# Patient Record
Sex: Female | Born: 1945 | Race: White | Hispanic: No | Smoking: Former smoker
Health system: Southern US, Community
[De-identification: ages and names within clinical notes are randomized; demographics above are authoritative.]

## PROBLEM LIST (undated history)

## (undated) ENCOUNTER — Emergency Department (HOSPITAL_COMMUNITY): Payer: PRIVATE HEALTH INSURANCE | Source: Home / Self Care

## (undated) DIAGNOSIS — Z9981 Dependence on supplemental oxygen: Secondary | ICD-10-CM

## (undated) DIAGNOSIS — E7800 Pure hypercholesterolemia, unspecified: Secondary | ICD-10-CM

## (undated) DIAGNOSIS — F419 Anxiety disorder, unspecified: Secondary | ICD-10-CM

## (undated) DIAGNOSIS — F32A Depression, unspecified: Secondary | ICD-10-CM

## (undated) DIAGNOSIS — J449 Chronic obstructive pulmonary disease, unspecified: Secondary | ICD-10-CM

## (undated) DIAGNOSIS — J189 Pneumonia, unspecified organism: Secondary | ICD-10-CM

## (undated) DIAGNOSIS — F329 Major depressive disorder, single episode, unspecified: Secondary | ICD-10-CM

## (undated) DIAGNOSIS — C50919 Malignant neoplasm of unspecified site of unspecified female breast: Secondary | ICD-10-CM

## (undated) DIAGNOSIS — E785 Hyperlipidemia, unspecified: Secondary | ICD-10-CM

## (undated) DIAGNOSIS — I219 Acute myocardial infarction, unspecified: Secondary | ICD-10-CM

---

## 1979-07-30 HISTORY — PX: TUBAL LIGATION: SHX77

## 2012-02-21 DIAGNOSIS — J449 Chronic obstructive pulmonary disease, unspecified: Secondary | ICD-10-CM | POA: Diagnosis not present

## 2012-02-21 DIAGNOSIS — G4700 Insomnia, unspecified: Secondary | ICD-10-CM | POA: Diagnosis not present

## 2012-02-21 DIAGNOSIS — E782 Mixed hyperlipidemia: Secondary | ICD-10-CM | POA: Diagnosis not present

## 2012-04-25 DIAGNOSIS — D485 Neoplasm of uncertain behavior of skin: Secondary | ICD-10-CM | POA: Diagnosis not present

## 2012-04-25 DIAGNOSIS — C44319 Basal cell carcinoma of skin of other parts of face: Secondary | ICD-10-CM | POA: Diagnosis not present

## 2012-05-22 DIAGNOSIS — J449 Chronic obstructive pulmonary disease, unspecified: Secondary | ICD-10-CM | POA: Diagnosis not present

## 2012-06-29 DIAGNOSIS — C44319 Basal cell carcinoma of skin of other parts of face: Secondary | ICD-10-CM | POA: Diagnosis not present

## 2012-08-20 DIAGNOSIS — E782 Mixed hyperlipidemia: Secondary | ICD-10-CM | POA: Diagnosis not present

## 2012-08-20 DIAGNOSIS — J449 Chronic obstructive pulmonary disease, unspecified: Secondary | ICD-10-CM | POA: Diagnosis not present

## 2012-11-19 DIAGNOSIS — E782 Mixed hyperlipidemia: Secondary | ICD-10-CM | POA: Diagnosis not present

## 2012-11-19 DIAGNOSIS — J449 Chronic obstructive pulmonary disease, unspecified: Secondary | ICD-10-CM | POA: Diagnosis not present

## 2013-02-18 DIAGNOSIS — Z0000 Encounter for general adult medical examination without abnormal findings: Secondary | ICD-10-CM | POA: Diagnosis not present

## 2013-02-18 DIAGNOSIS — E782 Mixed hyperlipidemia: Secondary | ICD-10-CM | POA: Diagnosis not present

## 2013-04-28 DIAGNOSIS — J189 Pneumonia, unspecified organism: Secondary | ICD-10-CM

## 2013-04-28 HISTORY — DX: Pneumonia, unspecified organism: J18.9

## 2013-05-06 DIAGNOSIS — J449 Chronic obstructive pulmonary disease, unspecified: Secondary | ICD-10-CM | POA: Diagnosis not present

## 2013-05-10 DIAGNOSIS — E785 Hyperlipidemia, unspecified: Secondary | ICD-10-CM | POA: Diagnosis not present

## 2013-05-10 DIAGNOSIS — F411 Generalized anxiety disorder: Secondary | ICD-10-CM | POA: Diagnosis not present

## 2013-05-10 DIAGNOSIS — Z85028 Personal history of other malignant neoplasm of stomach: Secondary | ICD-10-CM | POA: Diagnosis not present

## 2013-05-10 DIAGNOSIS — B370 Candidal stomatitis: Secondary | ICD-10-CM | POA: Diagnosis not present

## 2013-05-10 DIAGNOSIS — J438 Other emphysema: Secondary | ICD-10-CM | POA: Diagnosis not present

## 2013-05-10 DIAGNOSIS — J441 Chronic obstructive pulmonary disease with (acute) exacerbation: Secondary | ICD-10-CM | POA: Diagnosis not present

## 2013-05-10 DIAGNOSIS — Z23 Encounter for immunization: Secondary | ICD-10-CM | POA: Diagnosis not present

## 2013-05-10 DIAGNOSIS — J9600 Acute respiratory failure, unspecified whether with hypoxia or hypercapnia: Secondary | ICD-10-CM | POA: Diagnosis not present

## 2013-05-10 DIAGNOSIS — IMO0002 Reserved for concepts with insufficient information to code with codable children: Secondary | ICD-10-CM | POA: Diagnosis not present

## 2013-05-10 DIAGNOSIS — R0989 Other specified symptoms and signs involving the circulatory and respiratory systems: Secondary | ICD-10-CM | POA: Diagnosis not present

## 2013-05-10 DIAGNOSIS — Z79899 Other long term (current) drug therapy: Secondary | ICD-10-CM | POA: Diagnosis not present

## 2013-05-10 DIAGNOSIS — Z780 Asymptomatic menopausal state: Secondary | ICD-10-CM | POA: Diagnosis not present

## 2013-05-10 DIAGNOSIS — J449 Chronic obstructive pulmonary disease, unspecified: Secondary | ICD-10-CM | POA: Diagnosis not present

## 2013-05-10 DIAGNOSIS — F17200 Nicotine dependence, unspecified, uncomplicated: Secondary | ICD-10-CM | POA: Diagnosis present

## 2013-05-11 DIAGNOSIS — B370 Candidal stomatitis: Secondary | ICD-10-CM | POA: Diagnosis not present

## 2013-05-11 DIAGNOSIS — E785 Hyperlipidemia, unspecified: Secondary | ICD-10-CM | POA: Diagnosis not present

## 2013-05-11 DIAGNOSIS — J9600 Acute respiratory failure, unspecified whether with hypoxia or hypercapnia: Secondary | ICD-10-CM | POA: Diagnosis not present

## 2013-05-11 DIAGNOSIS — J441 Chronic obstructive pulmonary disease with (acute) exacerbation: Secondary | ICD-10-CM | POA: Diagnosis not present

## 2013-05-11 DIAGNOSIS — F411 Generalized anxiety disorder: Secondary | ICD-10-CM | POA: Diagnosis not present

## 2013-05-15 DIAGNOSIS — R0989 Other specified symptoms and signs involving the circulatory and respiratory systems: Secondary | ICD-10-CM | POA: Diagnosis not present

## 2013-05-21 DIAGNOSIS — J449 Chronic obstructive pulmonary disease, unspecified: Secondary | ICD-10-CM | POA: Diagnosis not present

## 2013-05-21 DIAGNOSIS — F329 Major depressive disorder, single episode, unspecified: Secondary | ICD-10-CM | POA: Diagnosis not present

## 2013-08-20 DIAGNOSIS — J449 Chronic obstructive pulmonary disease, unspecified: Secondary | ICD-10-CM | POA: Diagnosis not present

## 2013-08-20 DIAGNOSIS — R609 Edema, unspecified: Secondary | ICD-10-CM | POA: Diagnosis not present

## 2013-09-26 DIAGNOSIS — Z23 Encounter for immunization: Secondary | ICD-10-CM | POA: Diagnosis not present

## 2013-09-27 DIAGNOSIS — E782 Mixed hyperlipidemia: Secondary | ICD-10-CM | POA: Diagnosis not present

## 2013-11-18 DIAGNOSIS — J449 Chronic obstructive pulmonary disease, unspecified: Secondary | ICD-10-CM | POA: Diagnosis not present

## 2013-12-30 DIAGNOSIS — J449 Chronic obstructive pulmonary disease, unspecified: Secondary | ICD-10-CM | POA: Diagnosis not present

## 2014-02-17 DIAGNOSIS — J449 Chronic obstructive pulmonary disease, unspecified: Secondary | ICD-10-CM | POA: Diagnosis not present

## 2014-02-17 DIAGNOSIS — E782 Mixed hyperlipidemia: Secondary | ICD-10-CM | POA: Diagnosis not present

## 2014-05-06 DIAGNOSIS — H2510 Age-related nuclear cataract, unspecified eye: Secondary | ICD-10-CM | POA: Diagnosis not present

## 2014-05-20 DIAGNOSIS — Z0000 Encounter for general adult medical examination without abnormal findings: Secondary | ICD-10-CM | POA: Diagnosis not present

## 2014-05-20 DIAGNOSIS — J449 Chronic obstructive pulmonary disease, unspecified: Secondary | ICD-10-CM | POA: Diagnosis not present

## 2014-08-21 DIAGNOSIS — J449 Chronic obstructive pulmonary disease, unspecified: Secondary | ICD-10-CM | POA: Diagnosis not present

## 2014-10-22 DIAGNOSIS — I219 Acute myocardial infarction, unspecified: Secondary | ICD-10-CM

## 2014-10-22 HISTORY — DX: Acute myocardial infarction, unspecified: I21.9

## 2014-10-23 ENCOUNTER — Inpatient Hospital Stay (HOSPITAL_COMMUNITY)
Admission: AD | Admit: 2014-10-23 | Discharge: 2014-10-24 | DRG: 282 | Disposition: A | Discharge status: Home / Self Care | Payer: Medicare Other | Source: Other Acute Inpatient Hospital | Attending: Cardiology | Admitting: Cardiology

## 2014-10-23 ENCOUNTER — Encounter (HOSPITAL_COMMUNITY): Payer: Self-pay | Admitting: Cardiology

## 2014-10-23 DIAGNOSIS — I498 Other specified cardiac arrhythmias: Secondary | ICD-10-CM | POA: Diagnosis not present

## 2014-10-23 DIAGNOSIS — E780 Pure hypercholesterolemia: Secondary | ICD-10-CM | POA: Diagnosis not present

## 2014-10-23 DIAGNOSIS — I2510 Atherosclerotic heart disease of native coronary artery without angina pectoris: Secondary | ICD-10-CM | POA: Diagnosis not present

## 2014-10-23 DIAGNOSIS — J449 Chronic obstructive pulmonary disease, unspecified: Secondary | ICD-10-CM | POA: Diagnosis present

## 2014-10-23 DIAGNOSIS — R0689 Other abnormalities of breathing: Secondary | ICD-10-CM | POA: Diagnosis not present

## 2014-10-23 DIAGNOSIS — Z9989 Dependence on other enabling machines and devices: Secondary | ICD-10-CM | POA: Diagnosis not present

## 2014-10-23 DIAGNOSIS — E785 Hyperlipidemia, unspecified: Secondary | ICD-10-CM | POA: Diagnosis present

## 2014-10-23 DIAGNOSIS — I517 Cardiomegaly: Secondary | ICD-10-CM | POA: Diagnosis not present

## 2014-10-23 DIAGNOSIS — Z7902 Long term (current) use of antithrombotics/antiplatelets: Secondary | ICD-10-CM

## 2014-10-23 DIAGNOSIS — Z79899 Other long term (current) drug therapy: Secondary | ICD-10-CM

## 2014-10-23 DIAGNOSIS — Z87891 Personal history of nicotine dependence: Secondary | ICD-10-CM | POA: Diagnosis not present

## 2014-10-23 DIAGNOSIS — J439 Emphysema, unspecified: Secondary | ICD-10-CM | POA: Diagnosis not present

## 2014-10-23 DIAGNOSIS — R079 Chest pain, unspecified: Secondary | ICD-10-CM | POA: Diagnosis not present

## 2014-10-23 DIAGNOSIS — I214 Non-ST elevation (NSTEMI) myocardial infarction: Principal | ICD-10-CM | POA: Diagnosis present

## 2014-10-23 DIAGNOSIS — R0602 Shortness of breath: Secondary | ICD-10-CM | POA: Diagnosis not present

## 2014-10-23 HISTORY — DX: Chronic obstructive pulmonary disease, unspecified: J44.9

## 2014-10-23 HISTORY — DX: Dependence on supplemental oxygen: Z99.81

## 2014-10-23 HISTORY — DX: Depression, unspecified: F32.A

## 2014-10-23 HISTORY — DX: Hyperlipidemia, unspecified: E78.5

## 2014-10-23 HISTORY — DX: Major depressive disorder, single episode, unspecified: F32.9

## 2014-10-23 HISTORY — DX: Pure hypercholesterolemia, unspecified: E78.00

## 2014-10-23 HISTORY — DX: Acute myocardial infarction, unspecified: I21.9

## 2014-10-23 HISTORY — DX: Pneumonia, unspecified organism: J18.9

## 2014-10-23 HISTORY — DX: Anxiety disorder, unspecified: F41.9

## 2014-10-23 LAB — LIPID PANEL
CHOL/HDL RATIO: 2.5 ratio
Cholesterol: 121 mg/dL (ref 0–200)
HDL: 49 mg/dL (ref >39)
LDL CALC: 57 mg/dL (ref 0–99)
Triglycerides: 73 mg/dL (ref <150)
VLDL: 15 mg/dL (ref 0–40)

## 2014-10-23 LAB — TROPONIN I: TROPONIN I: 2.75 ng/mL — AB (ref <0.30)

## 2014-10-23 MED ORDER — SODIUM CHLORIDE 0.9 % IV SOLN: 250.0000 mL | INTRAVENOUS | Status: DC | PRN

## 2014-10-23 MED ORDER — ONDANSETRON HCL 4 MG/2ML IJ SOLN: 4.0000 mg | Freq: Four times a day (QID) | INTRAMUSCULAR | Status: DC | PRN

## 2014-10-23 MED ORDER — BUDESONIDE-FORMOTEROL FUMARATE 160-4.5 MCG/ACT IN AERO
1.0000 | INHALATION_SPRAY | Freq: Every day | RESPIRATORY_TRACT | Status: DC
  Administered 2014-10-23 – 2014-10-24 (×2): 1 via RESPIRATORY_TRACT
  Filled 2014-10-23: qty 6

## 2014-10-23 MED ORDER — HEPARIN BOLUS VIA INFUSION
4000.0000 [IU] | Freq: Once | INTRAVENOUS | Status: AC
Start: 2014-10-23 — End: 2014-10-23
  Administered 2014-10-23: 4000 [IU] via INTRAVENOUS
  Filled 2014-10-23: qty 4000

## 2014-10-23 MED ORDER — HEPARIN (PORCINE) IN NACL 100-0.45 UNIT/ML-% IJ SOLN: 950.0000 [IU]/h | INTRAMUSCULAR | Status: DC

## 2014-10-23 MED ORDER — ATORVASTATIN CALCIUM 80 MG PO TABS: 80.0000 mg | ORAL_TABLET | Freq: Every day | ORAL | Status: DC

## 2014-10-23 MED ORDER — NITROGLYCERIN 0.4 MG SL SUBL: 0.4000 mg | SUBLINGUAL_TABLET | SUBLINGUAL | Status: DC | PRN

## 2014-10-23 MED ORDER — SODIUM CHLORIDE 0.9 % IV SOLN
INTRAVENOUS | Status: DC
  Administered 2014-10-24: 04:00:00 via INTRAVENOUS

## 2014-10-23 MED ORDER — ASPIRIN EC 81 MG PO TBEC
81.0000 mg | DELAYED_RELEASE_TABLET | Freq: Every day | ORAL | Status: DC
  Administered 2014-10-24: 81 mg via ORAL
  Filled 2014-10-23: qty 1

## 2014-10-23 MED ORDER — ALBUTEROL SULFATE (2.5 MG/3ML) 0.083% IN NEBU: 2.5000 mg | INHALATION_SOLUTION | Freq: Four times a day (QID) | RESPIRATORY_TRACT | Status: DC | PRN

## 2014-10-23 MED ORDER — ALPRAZOLAM 0.25 MG PO TABS
0.2500 mg | ORAL_TABLET | Freq: Two times a day (BID) | ORAL | Status: DC | PRN
  Administered 2014-10-23 – 2014-10-24 (×2): 0.25 mg via ORAL
  Filled 2014-10-23 (×2): qty 1

## 2014-10-23 MED ORDER — SODIUM CHLORIDE 0.9 % IJ SOLN
3.0000 mL | Freq: Two times a day (BID) | INTRAMUSCULAR | Status: DC
  Administered 2014-10-23: 3 mL via INTRAVENOUS

## 2014-10-23 MED ORDER — ASPIRIN 300 MG RE SUPP: 300.0000 mg | RECTAL | Status: DC

## 2014-10-23 MED ORDER — TIOTROPIUM BROMIDE MONOHYDRATE 18 MCG IN CAPS
18.0000 ug | ORAL_CAPSULE | Freq: Every day | RESPIRATORY_TRACT | Status: DC
  Administered 2014-10-24: 18 ug via RESPIRATORY_TRACT
  Filled 2014-10-23: qty 5

## 2014-10-23 MED ORDER — CITALOPRAM HYDROBROMIDE 20 MG PO TABS
20.0000 mg | ORAL_TABLET | Freq: Every day | ORAL | Status: DC
  Administered 2014-10-24: 20 mg via ORAL
  Filled 2014-10-23: qty 1

## 2014-10-23 MED ORDER — ZOLPIDEM TARTRATE 5 MG PO TABS: 5.0000 mg | ORAL_TABLET | Freq: Every evening | ORAL | Status: DC | PRN

## 2014-10-23 MED ORDER — METOPROLOL TARTRATE 25 MG PO TABS
25.0000 mg | ORAL_TABLET | Freq: Two times a day (BID) | ORAL | Status: DC
Start: 2014-10-23 — End: 2014-10-24
  Administered 2014-10-23: 25 mg via ORAL
  Filled 2014-10-23: qty 1

## 2014-10-23 MED ORDER — ASPIRIN 81 MG PO CHEW: 324.0000 mg | CHEWABLE_TABLET | ORAL | Status: DC

## 2014-10-23 MED ORDER — SODIUM CHLORIDE 0.9 % IJ SOLN: 3.0000 mL | INTRAMUSCULAR | Status: DC | PRN

## 2014-10-23 MED ORDER — ACETAMINOPHEN 325 MG PO TABS: 650.0000 mg | ORAL_TABLET | ORAL | Status: DC | PRN

## 2014-10-23 MED ORDER — HEPARIN (PORCINE) IN NACL 100-0.45 UNIT/ML-% IJ SOLN
950.0000 [IU]/h | INTRAMUSCULAR | Status: DC
  Administered 2014-10-23: 950 [IU]/h via INTRAVENOUS
  Filled 2014-10-23: qty 250

## 2014-10-23 NOTE — Progress Notes (Addendum)
ANTICOAGULATION CONSULT NOTE - Initial Consult  Pharmacy Consult for Heparin Indication: ACS / STEMI  No Known Allergies  Patient Measurements: Height: 5\' 6"  (167.6 cm) Weight: 173 lb 1.6 oz (78.518 kg) IBW/kg (Calculated) : 59.3 Heparin Dosing Weight: 78 kg  Vital Signs: Temp: 99 F (37.2 C) (11/26 1946) Temp Source: Oral (11/26 1946) BP: 131/61 mmHg (11/26 1946) Pulse Rate: 93 (11/26 1946)  Labs: No results for input(s): HGB, HCT, PLT, APTT, LABPROT, INR, HEPARINUNFRC, CREATININE, CKTOTAL, CKMB, TROPONINI in the last 72 hours.  CrCl cannot be calculated (Patient has no serum creatinine result on file.).   Medical History: No past medical history on file.  Assessment: 68 yo F presents on 11/26 with MI. Transferred from [place]. CBC earlier today showed Hgb of 12.6, plts 267. Pharmacy to dose heparin for ACS/MI.  Goal of Therapy:  Heparin level 0.3-0.7 units/ml Monitor platelets by anticoagulation protocol: Yes   Plan:  Give heparin BOLUS 4,000 units x 1 Start heparin gtt 950 units/hr Check 6hr HL at 0300 tomorrow Monitor daily HL, CBC, s/s of bleed  Leronda Lewers J 10/23/2014,8:16 PM

## 2014-10-23 NOTE — H&P (Signed)
Barbara Gibson is an 68 y.o. female.   Chief Complaint: Chest pain HPI: Patient is a 68 year old Caucasian female with history of COPD, prior tobacco use disorder quit 2 years ago, history of COPD, last night went to bed after having dinner, had chest tightness all night in the middle of the chest. She was uncomfortable throughout the night but no other associated symptoms. In the morning she felt that she needed to be checked out, she got dressed and felt the chest pain immediately got relieved but still went to [facility] where she was diagnosed with non-ST elevation myocardial infarction with positive serum troponin of 2.0. Next  Now transferred to [facility] for further cardiac evaluation. Patient asymptomatic. She has chronic dyspnea, daughter present at the bedside. Denies symptoms of TIA or claudication. She is not a diabetic.  Past Medical History  Diagnosis Date  . COPD (chronic obstructive pulmonary disease)   . Hyperlipidemia     No past surgical history on file.  No family history on file. Social History:  reports that she quit smoking about 22 months ago. She does not have any smokeless tobacco history on file. She reports that she does not drink alcohol or use illicit drugs.  Allergies: No Known Allergies  Medications Prior to Admission  Medication Sig Dispense Refill  . albuterol (PROVENTIL) (2.5 MG/3ML) 0.083% nebulizer solution Take 2.5 mg by nebulization every 6 (six) hours as needed for wheezing or shortness of breath.    . ALPRAZolam (XANAX) 1 MG tablet Take 1 mg by mouth 3 (three) times daily as needed for anxiety.    . Biotin (BIOTIN MAXIMUM STRENGTH) 10 MG TABS Take 1 tablet by mouth daily.    . budesonide-formoterol (SYMBICORT) 160-4.5 MCG/ACT inhaler Inhale 1 puff into the lungs daily.    . calcium gluconate 500 MG tablet Take 1 tablet by mouth daily.    . citalopram (CELEXA) 20 MG tablet Take 20 mg by mouth daily.    Marland Kitchen ibuprofen (ADVIL,MOTRIN) 200  MG tablet Take 600-800 mg by mouth every 6 (six) hours as needed for headache (pain).    . Multiple Vitamin (MULTIVITAMIN WITH MINERALS) TABS tablet Take 1 tablet by mouth daily.    . Omega-3 Fatty Acids (FISH OIL) 1000 MG CAPS Take 1 capsule by mouth 2 (two) times daily.    . simvastatin (ZOCOR) 20 MG tablet Take 20 mg by mouth every evening.    . tiotropium (SPIRIVA) 18 MCG inhalation capsule Place 18 mcg into inhaler and inhale daily.        Review of Systems - has mild chronic dyspnea, no PND or orthopnea. No bowel or bladder disturbances. No symptoms to suggest TIA. No recent weight changes. Not a diabetic. Other systems negative.  Blood pressure 131/61, pulse 93, temperature 99 F (37.2 C), temperature source Oral, height 5\' 6"  (1.676 m), weight 78.518 kg (173 lb 1.6 oz), SpO2 96 %. General appearance: alert, cooperative, appears stated age and no distress Eyes: negative findings: lids and lashes normal Neck: no carotid bruit, no JVD, supple, symmetrical, trachea midline and thyroid not enlarged, symmetric, no tenderness/mass/nodules Neck: JVP - normal, carotids 2+= without bruits Resp: Mild emphysematous chest, faint scattered crackles heard. Chest wall: no tenderness Cardio: regular rate and rhythm, S1, S2 normal, no murmur, click, rub or gallop GI: soft, non-tender; bowel sounds normal; no masses,  no organomegaly Extremities: extremities normal, atraumatic, no cyanosis or edema Pulses: 2+ and symmetric Skin: Skin color, texture, turgor normal. No rashes  or lesions Neurologic: Grossly normal  EKG:  NSR. No ischemia  Assessment/Plan 1. Non-ST elevation myocardial infarction 2. COPD 3. Hyperlipidemia 4. Prior history of tobacco use disorder  Recommendation: Patient needs coronary angiography. Medical therapy versus angiography was discussed with the patient and her daughter at the bedside. Patient presently asymptomatic. Due to her cardiovascular risk factors, best option is  to proceed with angiography, I have discussed regarding less than 1% risk of death, stroke, MI, need for urgent CABG but not limited to this. Patient is willing to proceed.  Laverda Page, MD 10/23/2014, 9:09 PM [facility] [place] Pager: [phone] Office: [phone] If no answer: Cell:  [phone]

## 2014-10-24 ENCOUNTER — Encounter (HOSPITAL_COMMUNITY): Payer: Self-pay | Admitting: General Practice

## 2014-10-24 ENCOUNTER — Encounter (HOSPITAL_COMMUNITY)
Admission: AD | Disposition: A | Discharge status: Home / Self Care | Payer: Self-pay | Source: Other Acute Inpatient Hospital | Attending: Cardiology

## 2014-10-24 DIAGNOSIS — I214 Non-ST elevation (NSTEMI) myocardial infarction: Secondary | ICD-10-CM | POA: Diagnosis not present

## 2014-10-24 DIAGNOSIS — J449 Chronic obstructive pulmonary disease, unspecified: Secondary | ICD-10-CM | POA: Diagnosis not present

## 2014-10-24 HISTORY — PX: CARDIAC CATHETERIZATION: SHX172

## 2014-10-24 HISTORY — PX: LEFT HEART CATHETERIZATION WITH CORONARY ANGIOGRAM: SHX5451

## 2014-10-24 LAB — BASIC METABOLIC PANEL
Anion gap: 9 (ref 5–15)
BUN: 7 mg/dL (ref 6–23)
CALCIUM: 8.6 mg/dL (ref 8.4–10.5)
CO2: 30 meq/L (ref 19–32)
CREATININE: 0.66 mg/dL (ref 0.50–1.10)
Chloride: 105 mEq/L (ref 96–112)
GFR calc Af Amer: >90 mL/min (ref >90)
GFR, EST NON AFRICAN AMERICAN: 88 mL/min — AB (ref >90)
GLUCOSE: 127 mg/dL — AB (ref 70–99)
Potassium: 4.1 mEq/L (ref 3.7–5.3)
Sodium: 144 mEq/L (ref 137–147)

## 2014-10-24 LAB — HEPARIN LEVEL (UNFRACTIONATED)
Heparin Unfractionated: 0.39 IU/mL (ref 0.30–0.70)
Heparin Unfractionated: <0.1 IU/mL — ABNORMAL LOW (ref 0.30–0.70)

## 2014-10-24 LAB — CBC
HCT: 36.7 % (ref 36.0–46.0)
Hemoglobin: 11.2 g/dL — ABNORMAL LOW (ref 12.0–15.0)
MCH: 27.5 pg (ref 26.0–34.0)
MCHC: 30.5 g/dL (ref 30.0–36.0)
MCV: 90.2 fL (ref 78.0–100.0)
PLATELETS: 213 10*3/uL (ref 150–400)
RBC: 4.07 MIL/uL (ref 3.87–5.11)
RDW: 13.3 % (ref 11.5–15.5)
WBC: 8.7 10*3/uL (ref 4.0–10.5)

## 2014-10-24 LAB — PROTIME-INR
INR: 1.17 (ref 0.00–1.49)
Prothrombin Time: 15 seconds (ref 11.6–15.2)

## 2014-10-24 LAB — TROPONIN I
Troponin I: 2.54 ng/mL (ref <0.30)
Troponin I: 1.77 ng/mL (ref <0.30)

## 2014-10-24 SURGERY — LEFT HEART CATHETERIZATION WITH CORONARY ANGIOGRAM
Anesthesia: LOCAL

## 2014-10-24 MED ORDER — MIDAZOLAM HCL 2 MG/2ML IJ SOLN
INTRAMUSCULAR | Status: AC
  Filled 2014-10-24: qty 2

## 2014-10-24 MED ORDER — SODIUM CHLORIDE 0.9 % IV SOLN
1.0000 mL/kg/h | INTRAVENOUS | Status: DC
  Administered 2014-10-24: 1 mL/kg/h via INTRAVENOUS

## 2014-10-24 MED ORDER — VERAPAMIL HCL 2.5 MG/ML IV SOLN
INTRAVENOUS | Status: AC
  Filled 2014-10-24: qty 2

## 2014-10-24 MED ORDER — HEPARIN (PORCINE) IN NACL 2-0.9 UNIT/ML-% IJ SOLN
INTRAMUSCULAR | Status: AC
  Filled 2014-10-24: qty 1000

## 2014-10-24 MED ORDER — INFLUENZA VAC SPLIT QUAD 0.5 ML IM SUSY
0.5000 mL | PREFILLED_SYRINGE | INTRAMUSCULAR | Status: AC
  Administered 2014-10-24: 0.5 mL via INTRAMUSCULAR
  Filled 2014-10-24: qty 0.5

## 2014-10-24 MED ORDER — LIDOCAINE HCL (PF) 1 % IJ SOLN
INTRAMUSCULAR | Status: AC
  Filled 2014-10-24: qty 30

## 2014-10-24 MED ORDER — METOPROLOL TARTRATE 25 MG PO TABS: 12.5000 mg | ORAL_TABLET | Freq: Two times a day (BID) | ORAL | Status: AC

## 2014-10-24 MED ORDER — SIMVASTATIN 40 MG PO TABS: 20.0000 mg | ORAL_TABLET | Freq: Every evening | ORAL | Status: DC

## 2014-10-24 MED ORDER — CLOPIDOGREL BISULFATE 75 MG PO TABS: 75.0000 mg | ORAL_TABLET | Freq: Every day | ORAL | Status: AC

## 2014-10-24 MED ORDER — HYDROMORPHONE HCL 1 MG/ML IJ SOLN
INTRAMUSCULAR | Status: AC
  Filled 2014-10-24: qty 1

## 2014-10-24 MED ORDER — NITROGLYCERIN 1 MG/10 ML FOR IR/CATH LAB
INTRA_ARTERIAL | Status: AC
  Filled 2014-10-24: qty 10

## 2014-10-24 MED ORDER — HEPARIN SODIUM (PORCINE) 1000 UNIT/ML IJ SOLN
INTRAMUSCULAR | Status: AC
  Filled 2014-10-24: qty 1

## 2014-10-24 NOTE — Plan of Care (Signed)
Problem: Phase I Progression Outcomes Goal: Pain controlled with appropriate interventions Outcome: Completed/Met Date Met:  10/24/14 Goal: Voiding-avoid urinary catheter unless indicated Outcome: Completed/Met Date Met:  10/24/14 Goal: Hemodynamically stable Outcome: Completed/Met Date Met:  10/24/14

## 2014-10-24 NOTE — Discharge Summary (Addendum)
Physician Discharge Summary  Patient ID: Barbara Gibson MRN: [id number] DOB/AGE: January 20, 1946 68 y.o.  Admit date: 10/23/2014 Discharge date: 10/24/2014  Primary Discharge Diagnosis NSTEMI CAD of the native vessels Secondary Discharge Diagnosis Hyperlipidemia, 10/23/2014: Total cholesterol 121, triglycerides 73, HDL 49, LDL 57.  Significant Diagnostic Studies: COPD Prior history of tobacco use disorder  Hospital Course: patient presented with chest pain that started on 10/22/2014 in the evening, had chest pain all night, in the morning she decided to get dressed up and go to the hospital. By the time she presented to [facility], patient was completely asymptomatic, however her cardiac markers were positive for NSTEMI. Hence was transferred to [facility] and was admitted for further evaluation.  She underwent coronary angiography the following morning, revealing mildly ulcerated lesion in the midsegment of the circumflex carotid artery, however this had healed up well and the stenosis was about 30-40% at most. There was also diagonal 1 stenosis, which was 30-40%, and the midsegment, felt stable for medical management. As she had had no recurrence of chest pain, hemodynamically stable, felt stable for discharge with outpatient follow-up.   Recommendations on discharge: patient started on Plavix 75 mg by mouth daily which I would recommend continuing for at least 9 months to year, metoprolol 25 mg one half tablet by mouth twice a day and increase simvastatin to 40 mg by mouth every afternoon. She'll be seen by [place] in the outpatient basis in 2 weeks.  Discharge Exam: Blood pressure 144/55, pulse 87, temperature 100.2 F (37.9 C), temperature source Oral, height 5\' 6"  (1.676 m), weight 78.291 kg (172 lb 9.6 oz), SpO2 90 %.   General appearance: alert, cooperative, appears stated age and no distress Eyes: negative findings: lids and lashes normal Neck: no carotid bruit, no JVD,  supple, symmetrical, trachea midline and thyroid not enlarged, symmetric, no tenderness/mass/nodules Neck: JVP - normal, carotids 2+= without bruits Resp: Mild emphysematous chest, faint scattered crackles heard. Chest wall: no tenderness Cardio: regular rate and rhythm, S1, S2 normal, no murmur, click, rub or gallop GI: soft, non-tender; bowel sounds normal; no masses, no organomegaly Extremities: extremities normal, atraumatic, no cyanosis or edema Pulses: 2+ and symmetric  Labs:  No results found for: WBC, HGB, HCT, MCV, PLT No results for input(s): NA, K, CL, CO2, BUN, CREATININE, CALCIUM, PROT, BILITOT, ALKPHOS, ALT, AST, GLUCOSE in the last 168 hours.  Invalid input(s): LABALBU Lab Results  Component Value Date   TROPONINI 2.54* 10/24/2014    Lipid Panel     Component Value Date/Time   CHOL 121 10/23/2014 2100   TRIG 73 10/23/2014 2100   HDL 49 10/23/2014 2100   CHOLHDL 2.5 10/23/2014 2100   VLDL 15 10/23/2014 2100   LDLCALC 57 10/23/2014 2100    EKG: 10/23/2014 and 10/24/2014: Normal sinus rhythm, no evidence of ischemia. Normal EKG.  FOLLOW UP PLANS AND APPOINTMENTS    Medication List    STOP taking these medications        ibuprofen 200 MG tablet  Commonly known as:  ADVIL,MOTRIN      TAKE these medications        albuterol (2.5 MG/3ML) 0.083% nebulizer solution  Commonly known as:  PROVENTIL  Take 2.5 mg by nebulization every 6 (six) hours as needed for wheezing or shortness of breath.     ALPRAZolam 1 MG tablet  Commonly known as:  XANAX  Take 1 mg by mouth 3 (three) times daily as needed for anxiety.  BIOTIN MAXIMUM STRENGTH 10 MG Tabs  Generic drug:  Biotin  Take 1 tablet by mouth daily.     budesonide-formoterol 160-4.5 MCG/ACT inhaler  Commonly known as:  SYMBICORT  Inhale 1 puff into the lungs daily.     calcium gluconate 500 MG tablet  Take 1 tablet by mouth daily.     citalopram 20 MG tablet  Commonly known as:  CELEXA  Take 20 mg  by mouth daily.     clopidogrel 75 MG tablet  Commonly known as:  PLAVIX  Take 1 tablet (75 mg total) by mouth daily.     Fish Oil 1000 MG Caps  Take 1 capsule by mouth 2 (two) times daily.     metoprolol tartrate 25 MG tablet  Commonly known as:  LOPRESSOR  Take 0.5 tablets (12.5 mg total) by mouth 2 (two) times daily.     multivitamin with minerals Tabs tablet  Take 1 tablet by mouth daily.     simvastatin 40 MG tablet  Commonly known as:  ZOCOR  Take 0.5 tablets (20 mg total) by mouth every evening.     tiotropium 18 MCG inhalation capsule  Commonly known as:  SPIRIVA  Place 18 mcg into inhaler and inhale daily.           Follow-up Information    Follow up with Laverda Page, MD.   Specialty:  Cardiology   Why:  Please call to be seen in 2 weeks   Contact information:   [address] [place] [phone]        Laverda Page, MD 10/24/2014, 8:36 AM  Pager: [phone] Office: [phone] If no answer: [phone]

## 2014-10-24 NOTE — Progress Notes (Signed)
Pt TR band off, patient ambulated. Pt is stable Cormac Wint V, RN

## 2014-10-24 NOTE — Progress Notes (Signed)
Pt discharge paperwork reviewed with pt, pt will follow up with Dr. Einar Gip in two weeks. Etta Quill, RN

## 2014-10-24 NOTE — Progress Notes (Signed)
ANTICOAGULATION CONSULT NOTE - Follow Up Consult  Pharmacy Consult for Heparin  Indication: chest pain/ACS  No Known Allergies  Patient Measurements: Height: 5\' 6"  (167.6 cm) Weight: 173 lb 1.6 oz (78.518 kg) IBW/kg (Calculated) : 59.3  Vital Signs: Temp: 99 F (37.2 C) (11/26 1946) Temp Source: Oral (11/26 1946) BP: 131/61 mmHg (11/26 1946) Pulse Rate: 93 (11/26 1946)  Labs:  Recent Labs  10/23/14 2100 10/24/14 0204 10/24/14 0304  HEPARINUNFRC  --   --  0.39  TROPONINI 2.75* 2.54*  --     Assessment: Therapeutic heparin level x 1, other labs as above.   Goal of Therapy:  Heparin level 0.3-0.7 units/ml Monitor platelets by anticoagulation protocol: Yes   Plan:  -Continue heparin at 950 units/hr -1200 HL, pending cath plans -Daily CBC/HL -Monitor for bleeding  Narda Bonds 10/24/2014,5:42 AM

## 2014-10-24 NOTE — Interval H&P Note (Signed)
History and Physical Interval Note:  10/24/2014 7:50 AM  Barbara Gibson  has presented today for surgery, with the diagnosis of cp  The various methods of treatment have been discussed with the patient and family. After consideration of risks, benefits and other options for treatment, the patient has consented to  Procedure(s): LEFT HEART CATHETERIZATION WITH CORONARY ANGIOGRAM (N/A) and possible PCI as a surgical intervention .  The patient's history has been reviewed, patient examined, no change in status, stable for surgery.  I have reviewed the patient's chart and labs.  Questions were answered to the patient's satisfaction.   Cath Lab Visit (complete for each Cath Lab visit)  Clinical Evaluation Leading to the Procedure:   ACS: Yes.    Non-ACS:    Anginal Classification: CCS IV. NSTEMI  Anti-ischemic medical therapy: No Therapy  Non-Invasive Test Results: No non-invasive testing performed  Prior CABG: No previous CABG        [facility]

## 2014-10-24 NOTE — CV Procedure (Signed)
Procedure performed:  Left heart catheterization including hemodynamic monitoring of the left ventricle, LV gram, selective right and left coronary arteriography.  Indication patient is a 68 year-old Caucasian  female with history of hyperlipidemia, who presents with NSTEMI.  Hence is brought to the cardiac catheterization lab to evaluate the  coronary anatomy for definitive diagnosis of CAD.  Hemodynamic data:  Left ventricular pressure was 104/12 with LVEDP of 18 mm mercury. Aortic pressure was 107/57 with a mean of 79 mm mercury. There was no pressure gradient across the aortic valve  Left ventricle: Performed in the RAO projection revealed LVEF of 60-65%. There was No significant MR. No wall motion abnormality.  Right coronary artery: The vessel is Dominant. There is mild diffuse luminal irregularity constituting 10-20% stenosis.  Left main coronary artery is large and normal.  Circumflex coronary artery: A large vessel giving origin to a small to moderate-sized OM1 and a large obtuse marginal 2. After the origin of OM1, the circumflex coronary artery has tandem 30-40% stenosis with minimal haziness, with brisk flow was evident.  LAD:  LAD gives origin to a large diagonal-1.  LAD has very minimal luminal irregularities. D1 is large, midsegment of the diagonal has a 30% stenosis.  Impression: Moderate coronary artery disease involving the circumflex coronary artery and the diagonal branch of the LAD. Suspect the circumflex stenosis to be culprit, however the unstable plaque appears to have healed well. Patient has remained asymptomatic without any recurrence of chest pain without any EKG abnormalities. Hence we can certainly treat this medically. Aggressive risk modification is indicated, lipid control and start the patient on aspirin along with lab export least one year. Unless recurrence of chest pain, medical therapy for now.  Technique: Under sterile precautions using a 6 French right  radial  arterial access, a 6 French sheath was introduced into the right radial artery. A 5 [place] Tig 4 catheter was advanced into the ascending aorta selective  right coronary artery and left coronary artery was cannulated and angiography was performed in multiple views. The catheter was pulled back Out of the body over exchange length J-wire.  Same Catheter was used to perform LV gram which was performed in RAO projection.  Catheter exchanged out of the body over J-Wire. NO immediate complications noted. Patient tolerated the procedure well. A total of 40 mL of contrast was utilized for diagnostic angiography.  Disposition: Will be discharged home today with outpatient follow up.

## 2014-10-27 NOTE — Progress Notes (Signed)
UR Completed Channie Bostick Graves-Bigelow, RN,BSN [phone]  

## 2014-11-06 ENCOUNTER — Encounter (HOSPITAL_COMMUNITY): Payer: Self-pay | Admitting: Cardiology

## 2014-11-12 DIAGNOSIS — I2510 Atherosclerotic heart disease of native coronary artery without angina pectoris: Secondary | ICD-10-CM | POA: Diagnosis not present

## 2014-11-12 DIAGNOSIS — E780 Pure hypercholesterolemia: Secondary | ICD-10-CM | POA: Diagnosis not present

## 2014-11-18 DIAGNOSIS — J441 Chronic obstructive pulmonary disease with (acute) exacerbation: Secondary | ICD-10-CM | POA: Diagnosis not present

## 2014-11-18 DIAGNOSIS — I209 Angina pectoris, unspecified: Secondary | ICD-10-CM | POA: Diagnosis not present

## 2015-02-16 DIAGNOSIS — I10 Essential (primary) hypertension: Secondary | ICD-10-CM | POA: Diagnosis not present

## 2015-02-16 DIAGNOSIS — E784 Other hyperlipidemia: Secondary | ICD-10-CM | POA: Diagnosis not present

## 2015-02-16 DIAGNOSIS — J441 Chronic obstructive pulmonary disease with (acute) exacerbation: Secondary | ICD-10-CM | POA: Diagnosis not present

## 2015-02-16 DIAGNOSIS — I2510 Atherosclerotic heart disease of native coronary artery without angina pectoris: Secondary | ICD-10-CM | POA: Diagnosis not present

## 2015-02-16 DIAGNOSIS — Z131 Encounter for screening for diabetes mellitus: Secondary | ICD-10-CM | POA: Diagnosis not present

## 2015-05-19 DIAGNOSIS — J441 Chronic obstructive pulmonary disease with (acute) exacerbation: Secondary | ICD-10-CM | POA: Diagnosis not present

## 2015-05-19 DIAGNOSIS — E784 Other hyperlipidemia: Secondary | ICD-10-CM | POA: Diagnosis not present

## 2015-05-19 DIAGNOSIS — M180 Bilateral primary osteoarthritis of first carpometacarpal joints: Secondary | ICD-10-CM | POA: Diagnosis not present

## 2015-05-19 DIAGNOSIS — I10 Essential (primary) hypertension: Secondary | ICD-10-CM | POA: Diagnosis not present

## 2015-08-20 DIAGNOSIS — J441 Chronic obstructive pulmonary disease with (acute) exacerbation: Secondary | ICD-10-CM | POA: Diagnosis not present

## 2015-08-20 DIAGNOSIS — I10 Essential (primary) hypertension: Secondary | ICD-10-CM | POA: Diagnosis not present

## 2015-08-20 DIAGNOSIS — E784 Other hyperlipidemia: Secondary | ICD-10-CM | POA: Diagnosis not present

## 2015-08-20 DIAGNOSIS — M180 Bilateral primary osteoarthritis of first carpometacarpal joints: Secondary | ICD-10-CM | POA: Diagnosis not present

## 2015-09-12 DIAGNOSIS — Z23 Encounter for immunization: Secondary | ICD-10-CM | POA: Diagnosis not present

## 2015-09-23 DIAGNOSIS — D485 Neoplasm of uncertain behavior of skin: Secondary | ICD-10-CM | POA: Diagnosis not present

## 2015-09-23 DIAGNOSIS — L821 Other seborrheic keratosis: Secondary | ICD-10-CM | POA: Diagnosis not present

## 2015-09-23 DIAGNOSIS — C44319 Basal cell carcinoma of skin of other parts of face: Secondary | ICD-10-CM | POA: Diagnosis not present

## 2015-09-23 DIAGNOSIS — Z23 Encounter for immunization: Secondary | ICD-10-CM | POA: Diagnosis not present

## 2015-09-23 DIAGNOSIS — Z85828 Personal history of other malignant neoplasm of skin: Secondary | ICD-10-CM | POA: Diagnosis not present

## 2015-09-23 DIAGNOSIS — C4441 Basal cell carcinoma of skin of scalp and neck: Secondary | ICD-10-CM | POA: Diagnosis not present

## 2015-11-19 DIAGNOSIS — Z1389 Encounter for screening for other disorder: Secondary | ICD-10-CM | POA: Diagnosis not present

## 2015-11-19 DIAGNOSIS — J440 Chronic obstructive pulmonary disease with acute lower respiratory infection: Secondary | ICD-10-CM | POA: Diagnosis not present

## 2015-11-19 DIAGNOSIS — Z0000 Encounter for general adult medical examination without abnormal findings: Secondary | ICD-10-CM | POA: Diagnosis not present

## 2015-12-03 DIAGNOSIS — C4441 Basal cell carcinoma of skin of scalp and neck: Secondary | ICD-10-CM | POA: Diagnosis not present

## 2015-12-17 DIAGNOSIS — C4401 Basal cell carcinoma of skin of lip: Secondary | ICD-10-CM | POA: Diagnosis not present

## 2016-01-11 DIAGNOSIS — E7800 Pure hypercholesterolemia, unspecified: Secondary | ICD-10-CM | POA: Diagnosis not present

## 2016-01-11 DIAGNOSIS — F329 Major depressive disorder, single episode, unspecified: Secondary | ICD-10-CM | POA: Diagnosis not present

## 2016-01-11 DIAGNOSIS — I252 Old myocardial infarction: Secondary | ICD-10-CM | POA: Diagnosis not present

## 2016-01-11 DIAGNOSIS — M810 Age-related osteoporosis without current pathological fracture: Secondary | ICD-10-CM | POA: Diagnosis not present

## 2016-01-11 DIAGNOSIS — Z87891 Personal history of nicotine dependence: Secondary | ICD-10-CM | POA: Diagnosis not present

## 2016-01-11 DIAGNOSIS — I10 Essential (primary) hypertension: Secondary | ICD-10-CM | POA: Diagnosis not present

## 2016-01-11 DIAGNOSIS — J449 Chronic obstructive pulmonary disease, unspecified: Secondary | ICD-10-CM | POA: Diagnosis not present

## 2016-01-11 DIAGNOSIS — Z780 Asymptomatic menopausal state: Secondary | ICD-10-CM | POA: Diagnosis not present

## 2016-01-11 DIAGNOSIS — M8589 Other specified disorders of bone density and structure, multiple sites: Secondary | ICD-10-CM | POA: Diagnosis not present

## 2016-01-11 DIAGNOSIS — Z79899 Other long term (current) drug therapy: Secondary | ICD-10-CM | POA: Diagnosis not present

## 2016-01-14 DIAGNOSIS — R921 Mammographic calcification found on diagnostic imaging of breast: Secondary | ICD-10-CM | POA: Diagnosis not present

## 2016-01-14 DIAGNOSIS — R928 Other abnormal and inconclusive findings on diagnostic imaging of breast: Secondary | ICD-10-CM | POA: Diagnosis not present

## 2016-01-14 DIAGNOSIS — Z1231 Encounter for screening mammogram for malignant neoplasm of breast: Secondary | ICD-10-CM | POA: Diagnosis not present

## 2016-02-03 DIAGNOSIS — R928 Other abnormal and inconclusive findings on diagnostic imaging of breast: Secondary | ICD-10-CM | POA: Diagnosis not present

## 2016-02-03 DIAGNOSIS — R921 Mammographic calcification found on diagnostic imaging of breast: Secondary | ICD-10-CM | POA: Diagnosis not present

## 2016-02-03 DIAGNOSIS — N6489 Other specified disorders of breast: Secondary | ICD-10-CM | POA: Diagnosis not present

## 2016-02-16 DIAGNOSIS — J440 Chronic obstructive pulmonary disease with acute lower respiratory infection: Secondary | ICD-10-CM | POA: Diagnosis not present

## 2016-02-17 ENCOUNTER — Other Ambulatory Visit: Payer: Self-pay | Admitting: Internal Medicine

## 2016-02-17 DIAGNOSIS — R921 Mammographic calcification found on diagnostic imaging of breast: Secondary | ICD-10-CM

## 2016-02-24 ENCOUNTER — Ambulatory Visit
Admission: RE | Admit: 2016-02-24 | Discharge: 2016-02-24 | Disposition: A | Discharge status: Home / Self Care | Payer: Medicare Other | Source: Ambulatory Visit | Attending: Internal Medicine | Admitting: Internal Medicine

## 2016-02-24 ENCOUNTER — Other Ambulatory Visit: Payer: Self-pay | Admitting: Internal Medicine

## 2016-02-24 DIAGNOSIS — R921 Mammographic calcification found on diagnostic imaging of breast: Secondary | ICD-10-CM | POA: Diagnosis not present

## 2016-02-24 DIAGNOSIS — D0512 Intraductal carcinoma in situ of left breast: Secondary | ICD-10-CM | POA: Diagnosis not present

## 2016-02-24 DIAGNOSIS — N63 Unspecified lump in breast: Secondary | ICD-10-CM | POA: Diagnosis not present

## 2016-03-02 DIAGNOSIS — D0512 Intraductal carcinoma in situ of left breast: Secondary | ICD-10-CM | POA: Diagnosis not present

## 2016-03-08 DIAGNOSIS — Z79899 Other long term (current) drug therapy: Secondary | ICD-10-CM | POA: Diagnosis not present

## 2016-03-08 DIAGNOSIS — I252 Old myocardial infarction: Secondary | ICD-10-CM | POA: Diagnosis not present

## 2016-03-08 DIAGNOSIS — D0512 Intraductal carcinoma in situ of left breast: Secondary | ICD-10-CM | POA: Diagnosis not present

## 2016-03-08 DIAGNOSIS — I10 Essential (primary) hypertension: Secondary | ICD-10-CM | POA: Diagnosis not present

## 2016-03-08 DIAGNOSIS — Z7902 Long term (current) use of antithrombotics/antiplatelets: Secondary | ICD-10-CM | POA: Diagnosis not present

## 2016-03-08 DIAGNOSIS — D242 Benign neoplasm of left breast: Secondary | ICD-10-CM | POA: Diagnosis not present

## 2016-03-08 DIAGNOSIS — Z01818 Encounter for other preprocedural examination: Secondary | ICD-10-CM | POA: Diagnosis not present

## 2016-03-08 DIAGNOSIS — N6092 Unspecified benign mammary dysplasia of left breast: Secondary | ICD-10-CM | POA: Diagnosis not present

## 2016-03-08 DIAGNOSIS — J449 Chronic obstructive pulmonary disease, unspecified: Secondary | ICD-10-CM | POA: Diagnosis not present

## 2016-03-08 DIAGNOSIS — Z85818 Personal history of malignant neoplasm of other sites of lip, oral cavity, and pharynx: Secondary | ICD-10-CM | POA: Diagnosis not present

## 2016-03-09 DIAGNOSIS — D0512 Intraductal carcinoma in situ of left breast: Secondary | ICD-10-CM | POA: Diagnosis not present

## 2016-03-09 DIAGNOSIS — I10 Essential (primary) hypertension: Secondary | ICD-10-CM | POA: Diagnosis not present

## 2016-03-09 DIAGNOSIS — E785 Hyperlipidemia, unspecified: Secondary | ICD-10-CM | POA: Diagnosis not present

## 2016-03-09 DIAGNOSIS — Z7902 Long term (current) use of antithrombotics/antiplatelets: Secondary | ICD-10-CM | POA: Diagnosis not present

## 2016-03-09 DIAGNOSIS — R920 Mammographic microcalcification found on diagnostic imaging of breast: Secondary | ICD-10-CM | POA: Diagnosis not present

## 2016-03-09 DIAGNOSIS — N6082 Other benign mammary dysplasias of left breast: Secondary | ICD-10-CM | POA: Diagnosis not present

## 2016-03-09 DIAGNOSIS — I252 Old myocardial infarction: Secondary | ICD-10-CM | POA: Diagnosis not present

## 2016-03-09 DIAGNOSIS — J449 Chronic obstructive pulmonary disease, unspecified: Secondary | ICD-10-CM | POA: Diagnosis not present

## 2016-03-09 DIAGNOSIS — Z79899 Other long term (current) drug therapy: Secondary | ICD-10-CM | POA: Diagnosis not present

## 2016-03-09 DIAGNOSIS — I213 ST elevation (STEMI) myocardial infarction of unspecified site: Secondary | ICD-10-CM | POA: Diagnosis not present

## 2016-03-09 DIAGNOSIS — D242 Benign neoplasm of left breast: Secondary | ICD-10-CM | POA: Diagnosis not present

## 2016-03-09 DIAGNOSIS — N6092 Unspecified benign mammary dysplasia of left breast: Secondary | ICD-10-CM | POA: Diagnosis not present

## 2016-03-09 DIAGNOSIS — Z85818 Personal history of malignant neoplasm of other sites of lip, oral cavity, and pharynx: Secondary | ICD-10-CM | POA: Diagnosis not present

## 2016-03-31 DIAGNOSIS — I252 Old myocardial infarction: Secondary | ICD-10-CM | POA: Diagnosis not present

## 2016-03-31 DIAGNOSIS — I2510 Atherosclerotic heart disease of native coronary artery without angina pectoris: Secondary | ICD-10-CM | POA: Diagnosis not present

## 2016-03-31 DIAGNOSIS — M8580 Other specified disorders of bone density and structure, unspecified site: Secondary | ICD-10-CM | POA: Insufficient documentation

## 2016-03-31 DIAGNOSIS — Z87891 Personal history of nicotine dependence: Secondary | ICD-10-CM | POA: Diagnosis not present

## 2016-03-31 DIAGNOSIS — J431 Panlobular emphysema: Secondary | ICD-10-CM | POA: Diagnosis not present

## 2016-03-31 DIAGNOSIS — D0512 Intraductal carcinoma in situ of left breast: Secondary | ICD-10-CM | POA: Diagnosis not present

## 2016-05-09 DIAGNOSIS — N611 Abscess of the breast and nipple: Secondary | ICD-10-CM | POA: Diagnosis not present

## 2016-05-10 DIAGNOSIS — I10 Essential (primary) hypertension: Secondary | ICD-10-CM | POA: Diagnosis not present

## 2016-05-10 DIAGNOSIS — F419 Anxiety disorder, unspecified: Secondary | ICD-10-CM | POA: Diagnosis not present

## 2016-05-10 DIAGNOSIS — N611 Abscess of the breast and nipple: Secondary | ICD-10-CM | POA: Diagnosis not present

## 2016-05-10 DIAGNOSIS — Z7902 Long term (current) use of antithrombotics/antiplatelets: Secondary | ICD-10-CM | POA: Diagnosis not present

## 2016-05-10 DIAGNOSIS — J449 Chronic obstructive pulmonary disease, unspecified: Secondary | ICD-10-CM | POA: Diagnosis not present

## 2016-05-10 DIAGNOSIS — Z79899 Other long term (current) drug therapy: Secondary | ICD-10-CM | POA: Diagnosis not present

## 2016-05-10 DIAGNOSIS — Z853 Personal history of malignant neoplasm of breast: Secondary | ICD-10-CM | POA: Diagnosis not present

## 2016-05-10 DIAGNOSIS — E785 Hyperlipidemia, unspecified: Secondary | ICD-10-CM | POA: Diagnosis not present

## 2016-05-10 DIAGNOSIS — I252 Old myocardial infarction: Secondary | ICD-10-CM | POA: Diagnosis not present

## 2016-05-11 DIAGNOSIS — Z4800 Encounter for change or removal of nonsurgical wound dressing: Secondary | ICD-10-CM | POA: Diagnosis not present

## 2016-05-11 DIAGNOSIS — I10 Essential (primary) hypertension: Secondary | ICD-10-CM | POA: Diagnosis not present

## 2016-05-11 DIAGNOSIS — N611 Abscess of the breast and nipple: Secondary | ICD-10-CM | POA: Diagnosis not present

## 2016-05-11 DIAGNOSIS — Z85819 Personal history of malignant neoplasm of unspecified site of lip, oral cavity, and pharynx: Secondary | ICD-10-CM | POA: Diagnosis not present

## 2016-05-11 DIAGNOSIS — I252 Old myocardial infarction: Secondary | ICD-10-CM | POA: Diagnosis not present

## 2016-05-12 DIAGNOSIS — N611 Abscess of the breast and nipple: Secondary | ICD-10-CM | POA: Diagnosis not present

## 2016-05-12 DIAGNOSIS — I10 Essential (primary) hypertension: Secondary | ICD-10-CM | POA: Diagnosis not present

## 2016-05-12 DIAGNOSIS — Z4800 Encounter for change or removal of nonsurgical wound dressing: Secondary | ICD-10-CM | POA: Diagnosis not present

## 2016-05-12 DIAGNOSIS — I252 Old myocardial infarction: Secondary | ICD-10-CM | POA: Diagnosis not present

## 2016-05-12 DIAGNOSIS — Z85819 Personal history of malignant neoplasm of unspecified site of lip, oral cavity, and pharynx: Secondary | ICD-10-CM | POA: Diagnosis not present

## 2016-05-13 DIAGNOSIS — I10 Essential (primary) hypertension: Secondary | ICD-10-CM | POA: Diagnosis not present

## 2016-05-13 DIAGNOSIS — Z85819 Personal history of malignant neoplasm of unspecified site of lip, oral cavity, and pharynx: Secondary | ICD-10-CM | POA: Diagnosis not present

## 2016-05-13 DIAGNOSIS — Z4800 Encounter for change or removal of nonsurgical wound dressing: Secondary | ICD-10-CM | POA: Diagnosis not present

## 2016-05-13 DIAGNOSIS — I252 Old myocardial infarction: Secondary | ICD-10-CM | POA: Diagnosis not present

## 2016-05-13 DIAGNOSIS — N611 Abscess of the breast and nipple: Secondary | ICD-10-CM | POA: Diagnosis not present

## 2016-05-16 DIAGNOSIS — I252 Old myocardial infarction: Secondary | ICD-10-CM | POA: Diagnosis not present

## 2016-05-16 DIAGNOSIS — N611 Abscess of the breast and nipple: Secondary | ICD-10-CM | POA: Diagnosis not present

## 2016-05-16 DIAGNOSIS — Z85819 Personal history of malignant neoplasm of unspecified site of lip, oral cavity, and pharynx: Secondary | ICD-10-CM | POA: Diagnosis not present

## 2016-05-16 DIAGNOSIS — Z4800 Encounter for change or removal of nonsurgical wound dressing: Secondary | ICD-10-CM | POA: Diagnosis not present

## 2016-05-16 DIAGNOSIS — I10 Essential (primary) hypertension: Secondary | ICD-10-CM | POA: Diagnosis not present

## 2016-05-17 DIAGNOSIS — J440 Chronic obstructive pulmonary disease with acute lower respiratory infection: Secondary | ICD-10-CM | POA: Diagnosis not present

## 2016-05-17 DIAGNOSIS — I10 Essential (primary) hypertension: Secondary | ICD-10-CM | POA: Diagnosis not present

## 2016-05-17 DIAGNOSIS — I252 Old myocardial infarction: Secondary | ICD-10-CM | POA: Diagnosis not present

## 2016-05-20 DIAGNOSIS — I252 Old myocardial infarction: Secondary | ICD-10-CM | POA: Diagnosis not present

## 2016-05-20 DIAGNOSIS — I10 Essential (primary) hypertension: Secondary | ICD-10-CM | POA: Diagnosis not present

## 2016-05-20 DIAGNOSIS — Z85819 Personal history of malignant neoplasm of unspecified site of lip, oral cavity, and pharynx: Secondary | ICD-10-CM | POA: Diagnosis not present

## 2016-05-20 DIAGNOSIS — Z4800 Encounter for change or removal of nonsurgical wound dressing: Secondary | ICD-10-CM | POA: Diagnosis not present

## 2016-05-20 DIAGNOSIS — N611 Abscess of the breast and nipple: Secondary | ICD-10-CM | POA: Diagnosis not present

## 2016-05-23 DIAGNOSIS — I252 Old myocardial infarction: Secondary | ICD-10-CM | POA: Diagnosis not present

## 2016-05-23 DIAGNOSIS — Z4800 Encounter for change or removal of nonsurgical wound dressing: Secondary | ICD-10-CM | POA: Diagnosis not present

## 2016-05-23 DIAGNOSIS — N611 Abscess of the breast and nipple: Secondary | ICD-10-CM | POA: Diagnosis not present

## 2016-05-23 DIAGNOSIS — Z85819 Personal history of malignant neoplasm of unspecified site of lip, oral cavity, and pharynx: Secondary | ICD-10-CM | POA: Diagnosis not present

## 2016-05-23 DIAGNOSIS — I10 Essential (primary) hypertension: Secondary | ICD-10-CM | POA: Diagnosis not present

## 2016-05-27 DIAGNOSIS — Z85819 Personal history of malignant neoplasm of unspecified site of lip, oral cavity, and pharynx: Secondary | ICD-10-CM | POA: Diagnosis not present

## 2016-05-27 DIAGNOSIS — Z4800 Encounter for change or removal of nonsurgical wound dressing: Secondary | ICD-10-CM | POA: Diagnosis not present

## 2016-05-27 DIAGNOSIS — I252 Old myocardial infarction: Secondary | ICD-10-CM | POA: Diagnosis not present

## 2016-05-27 DIAGNOSIS — N611 Abscess of the breast and nipple: Secondary | ICD-10-CM | POA: Diagnosis not present

## 2016-05-27 DIAGNOSIS — I10 Essential (primary) hypertension: Secondary | ICD-10-CM | POA: Diagnosis not present

## 2016-06-01 DIAGNOSIS — I10 Essential (primary) hypertension: Secondary | ICD-10-CM | POA: Diagnosis not present

## 2016-06-01 DIAGNOSIS — Z85819 Personal history of malignant neoplasm of unspecified site of lip, oral cavity, and pharynx: Secondary | ICD-10-CM | POA: Diagnosis not present

## 2016-06-01 DIAGNOSIS — Z4800 Encounter for change or removal of nonsurgical wound dressing: Secondary | ICD-10-CM | POA: Diagnosis not present

## 2016-06-01 DIAGNOSIS — N611 Abscess of the breast and nipple: Secondary | ICD-10-CM | POA: Diagnosis not present

## 2016-06-01 DIAGNOSIS — I252 Old myocardial infarction: Secondary | ICD-10-CM | POA: Diagnosis not present

## 2016-06-03 DIAGNOSIS — Z4800 Encounter for change or removal of nonsurgical wound dressing: Secondary | ICD-10-CM | POA: Diagnosis not present

## 2016-06-03 DIAGNOSIS — I252 Old myocardial infarction: Secondary | ICD-10-CM | POA: Diagnosis not present

## 2016-06-03 DIAGNOSIS — N611 Abscess of the breast and nipple: Secondary | ICD-10-CM | POA: Diagnosis not present

## 2016-06-03 DIAGNOSIS — I10 Essential (primary) hypertension: Secondary | ICD-10-CM | POA: Diagnosis not present

## 2016-06-03 DIAGNOSIS — Z85819 Personal history of malignant neoplasm of unspecified site of lip, oral cavity, and pharynx: Secondary | ICD-10-CM | POA: Diagnosis not present

## 2016-06-07 DIAGNOSIS — I10 Essential (primary) hypertension: Secondary | ICD-10-CM | POA: Diagnosis not present

## 2016-06-07 DIAGNOSIS — Z85819 Personal history of malignant neoplasm of unspecified site of lip, oral cavity, and pharynx: Secondary | ICD-10-CM | POA: Diagnosis not present

## 2016-06-07 DIAGNOSIS — I252 Old myocardial infarction: Secondary | ICD-10-CM | POA: Diagnosis not present

## 2016-06-07 DIAGNOSIS — Z4800 Encounter for change or removal of nonsurgical wound dressing: Secondary | ICD-10-CM | POA: Diagnosis not present

## 2016-06-07 DIAGNOSIS — N611 Abscess of the breast and nipple: Secondary | ICD-10-CM | POA: Diagnosis not present

## 2016-06-14 DIAGNOSIS — I10 Essential (primary) hypertension: Secondary | ICD-10-CM | POA: Diagnosis not present

## 2016-06-14 DIAGNOSIS — Z4800 Encounter for change or removal of nonsurgical wound dressing: Secondary | ICD-10-CM | POA: Diagnosis not present

## 2016-06-14 DIAGNOSIS — Z85819 Personal history of malignant neoplasm of unspecified site of lip, oral cavity, and pharynx: Secondary | ICD-10-CM | POA: Diagnosis not present

## 2016-06-14 DIAGNOSIS — I252 Old myocardial infarction: Secondary | ICD-10-CM | POA: Diagnosis not present

## 2016-06-14 DIAGNOSIS — N611 Abscess of the breast and nipple: Secondary | ICD-10-CM | POA: Diagnosis not present

## 2016-06-24 DIAGNOSIS — I10 Essential (primary) hypertension: Secondary | ICD-10-CM | POA: Diagnosis not present

## 2016-06-24 DIAGNOSIS — Z4800 Encounter for change or removal of nonsurgical wound dressing: Secondary | ICD-10-CM | POA: Diagnosis not present

## 2016-06-24 DIAGNOSIS — Z85819 Personal history of malignant neoplasm of unspecified site of lip, oral cavity, and pharynx: Secondary | ICD-10-CM | POA: Diagnosis not present

## 2016-06-24 DIAGNOSIS — I252 Old myocardial infarction: Secondary | ICD-10-CM | POA: Diagnosis not present

## 2016-06-24 DIAGNOSIS — N611 Abscess of the breast and nipple: Secondary | ICD-10-CM | POA: Diagnosis not present

## 2016-06-28 DIAGNOSIS — R197 Diarrhea, unspecified: Secondary | ICD-10-CM | POA: Diagnosis not present

## 2016-06-28 DIAGNOSIS — J168 Pneumonia due to other specified infectious organisms: Secondary | ICD-10-CM | POA: Diagnosis not present

## 2016-06-28 DIAGNOSIS — K529 Noninfective gastroenteritis and colitis, unspecified: Secondary | ICD-10-CM | POA: Diagnosis not present

## 2016-06-28 DIAGNOSIS — Z79899 Other long term (current) drug therapy: Secondary | ICD-10-CM | POA: Diagnosis not present

## 2016-06-28 DIAGNOSIS — M6281 Muscle weakness (generalized): Secondary | ICD-10-CM | POA: Diagnosis not present

## 2016-06-28 DIAGNOSIS — B954 Other streptococcus as the cause of diseases classified elsewhere: Secondary | ICD-10-CM | POA: Diagnosis present

## 2016-06-28 DIAGNOSIS — I2510 Atherosclerotic heart disease of native coronary artery without angina pectoris: Secondary | ICD-10-CM | POA: Diagnosis not present

## 2016-06-28 DIAGNOSIS — Z9981 Dependence on supplemental oxygen: Secondary | ICD-10-CM | POA: Diagnosis not present

## 2016-06-28 DIAGNOSIS — F419 Anxiety disorder, unspecified: Secondary | ICD-10-CM | POA: Diagnosis present

## 2016-06-28 DIAGNOSIS — A047 Enterocolitis due to Clostridium difficile: Secondary | ICD-10-CM | POA: Diagnosis not present

## 2016-06-28 DIAGNOSIS — F329 Major depressive disorder, single episode, unspecified: Secondary | ICD-10-CM | POA: Diagnosis not present

## 2016-06-28 DIAGNOSIS — D72829 Elevated white blood cell count, unspecified: Secondary | ICD-10-CM | POA: Diagnosis not present

## 2016-06-28 DIAGNOSIS — N390 Urinary tract infection, site not specified: Secondary | ICD-10-CM | POA: Diagnosis not present

## 2016-06-28 DIAGNOSIS — R1032 Left lower quadrant pain: Secondary | ICD-10-CM | POA: Diagnosis not present

## 2016-06-28 DIAGNOSIS — E785 Hyperlipidemia, unspecified: Secondary | ICD-10-CM | POA: Diagnosis not present

## 2016-06-28 DIAGNOSIS — J9602 Acute respiratory failure with hypercapnia: Secondary | ICD-10-CM | POA: Diagnosis not present

## 2016-06-28 DIAGNOSIS — K2970 Gastritis, unspecified, without bleeding: Secondary | ICD-10-CM | POA: Diagnosis not present

## 2016-06-28 DIAGNOSIS — I10 Essential (primary) hypertension: Secondary | ICD-10-CM | POA: Diagnosis not present

## 2016-06-28 DIAGNOSIS — F17201 Nicotine dependence, unspecified, in remission: Secondary | ICD-10-CM | POA: Diagnosis present

## 2016-06-28 DIAGNOSIS — Z853 Personal history of malignant neoplasm of breast: Secondary | ICD-10-CM | POA: Diagnosis not present

## 2016-06-28 DIAGNOSIS — J9811 Atelectasis: Secondary | ICD-10-CM | POA: Diagnosis not present

## 2016-06-28 DIAGNOSIS — J439 Emphysema, unspecified: Secondary | ICD-10-CM | POA: Diagnosis not present

## 2016-06-28 DIAGNOSIS — R278 Other lack of coordination: Secondary | ICD-10-CM | POA: Diagnosis not present

## 2016-06-28 DIAGNOSIS — R109 Unspecified abdominal pain: Secondary | ICD-10-CM | POA: Diagnosis not present

## 2016-06-28 DIAGNOSIS — R531 Weakness: Secondary | ICD-10-CM | POA: Diagnosis not present

## 2016-06-28 DIAGNOSIS — J181 Lobar pneumonia, unspecified organism: Secondary | ICD-10-CM | POA: Diagnosis not present

## 2016-06-28 DIAGNOSIS — R339 Retention of urine, unspecified: Secondary | ICD-10-CM | POA: Diagnosis present

## 2016-06-28 DIAGNOSIS — J440 Chronic obstructive pulmonary disease with acute lower respiratory infection: Secondary | ICD-10-CM | POA: Diagnosis not present

## 2016-06-28 DIAGNOSIS — J189 Pneumonia, unspecified organism: Secondary | ICD-10-CM | POA: Diagnosis not present

## 2016-06-28 DIAGNOSIS — J9601 Acute respiratory failure with hypoxia: Secondary | ICD-10-CM | POA: Diagnosis not present

## 2016-06-28 DIAGNOSIS — E873 Alkalosis: Secondary | ICD-10-CM | POA: Diagnosis not present

## 2016-06-28 DIAGNOSIS — J441 Chronic obstructive pulmonary disease with (acute) exacerbation: Secondary | ICD-10-CM | POA: Diagnosis not present

## 2016-07-08 DIAGNOSIS — J440 Chronic obstructive pulmonary disease with acute lower respiratory infection: Secondary | ICD-10-CM | POA: Diagnosis not present

## 2016-07-08 DIAGNOSIS — N390 Urinary tract infection, site not specified: Secondary | ICD-10-CM | POA: Diagnosis not present

## 2016-07-08 DIAGNOSIS — J168 Pneumonia due to other specified infectious organisms: Secondary | ICD-10-CM | POA: Diagnosis not present

## 2016-07-08 DIAGNOSIS — A047 Enterocolitis due to Clostridium difficile: Secondary | ICD-10-CM | POA: Diagnosis not present

## 2016-07-08 DIAGNOSIS — M6281 Muscle weakness (generalized): Secondary | ICD-10-CM | POA: Diagnosis not present

## 2016-07-08 DIAGNOSIS — J9601 Acute respiratory failure with hypoxia: Secondary | ICD-10-CM | POA: Diagnosis not present

## 2016-07-08 DIAGNOSIS — F411 Generalized anxiety disorder: Secondary | ICD-10-CM | POA: Diagnosis not present

## 2016-07-08 DIAGNOSIS — R278 Other lack of coordination: Secondary | ICD-10-CM | POA: Diagnosis not present

## 2016-07-08 DIAGNOSIS — J189 Pneumonia, unspecified organism: Secondary | ICD-10-CM | POA: Diagnosis not present

## 2016-07-08 DIAGNOSIS — I2510 Atherosclerotic heart disease of native coronary artery without angina pectoris: Secondary | ICD-10-CM | POA: Diagnosis not present

## 2016-07-08 DIAGNOSIS — E785 Hyperlipidemia, unspecified: Secondary | ICD-10-CM | POA: Diagnosis not present

## 2016-07-08 DIAGNOSIS — E784 Other hyperlipidemia: Secondary | ICD-10-CM | POA: Diagnosis not present

## 2016-07-08 DIAGNOSIS — E873 Alkalosis: Secondary | ICD-10-CM | POA: Diagnosis not present

## 2016-07-08 DIAGNOSIS — I10 Essential (primary) hypertension: Secondary | ICD-10-CM | POA: Diagnosis not present

## 2016-07-08 DIAGNOSIS — F329 Major depressive disorder, single episode, unspecified: Secondary | ICD-10-CM | POA: Diagnosis not present

## 2016-07-08 DIAGNOSIS — J441 Chronic obstructive pulmonary disease with (acute) exacerbation: Secondary | ICD-10-CM | POA: Diagnosis not present

## 2016-07-08 DIAGNOSIS — J9602 Acute respiratory failure with hypercapnia: Secondary | ICD-10-CM | POA: Diagnosis not present

## 2016-07-08 DIAGNOSIS — F419 Anxiety disorder, unspecified: Secondary | ICD-10-CM | POA: Diagnosis not present

## 2016-07-08 DIAGNOSIS — R339 Retention of urine, unspecified: Secondary | ICD-10-CM | POA: Diagnosis not present

## 2016-07-08 DIAGNOSIS — B954 Other streptococcus as the cause of diseases classified elsewhere: Secondary | ICD-10-CM | POA: Diagnosis not present

## 2016-08-01 DIAGNOSIS — E784 Other hyperlipidemia: Secondary | ICD-10-CM | POA: Diagnosis not present

## 2016-08-01 DIAGNOSIS — J441 Chronic obstructive pulmonary disease with (acute) exacerbation: Secondary | ICD-10-CM | POA: Diagnosis not present

## 2016-08-01 DIAGNOSIS — F411 Generalized anxiety disorder: Secondary | ICD-10-CM | POA: Diagnosis not present

## 2016-08-01 DIAGNOSIS — I10 Essential (primary) hypertension: Secondary | ICD-10-CM | POA: Diagnosis not present

## 2016-08-03 ENCOUNTER — Ambulatory Visit (HOSPITAL_COMMUNITY): Payer: Medicare Other | Admitting: Oncology

## 2016-08-16 DIAGNOSIS — I10 Essential (primary) hypertension: Secondary | ICD-10-CM | POA: Diagnosis not present

## 2016-08-16 DIAGNOSIS — J440 Chronic obstructive pulmonary disease with acute lower respiratory infection: Secondary | ICD-10-CM | POA: Diagnosis not present

## 2016-08-16 DIAGNOSIS — C44519 Basal cell carcinoma of skin of other part of trunk: Secondary | ICD-10-CM | POA: Diagnosis not present

## 2016-08-16 DIAGNOSIS — I252 Old myocardial infarction: Secondary | ICD-10-CM | POA: Diagnosis not present

## 2016-08-17 DIAGNOSIS — D0512 Intraductal carcinoma in situ of left breast: Secondary | ICD-10-CM | POA: Diagnosis not present

## 2016-08-17 DIAGNOSIS — N611 Abscess of the breast and nipple: Secondary | ICD-10-CM | POA: Diagnosis not present

## 2016-08-22 DIAGNOSIS — C44519 Basal cell carcinoma of skin of other part of trunk: Secondary | ICD-10-CM | POA: Diagnosis not present

## 2016-08-31 ENCOUNTER — Ambulatory Visit (HOSPITAL_COMMUNITY): Payer: Medicare Other | Admitting: Hematology

## 2016-09-11 DIAGNOSIS — Z23 Encounter for immunization: Secondary | ICD-10-CM | POA: Diagnosis not present

## 2016-09-28 ENCOUNTER — Encounter (HOSPITAL_COMMUNITY): Payer: Self-pay | Admitting: Hematology

## 2016-09-28 ENCOUNTER — Encounter (HOSPITAL_COMMUNITY): Payer: Medicare Other

## 2016-09-28 ENCOUNTER — Encounter (HOSPITAL_COMMUNITY): Payer: Medicare Other | Attending: Hematology | Admitting: Hematology

## 2016-09-28 VITALS — BP 154/63 | HR 86 | Temp 98.3°F | Resp 18 | Ht 66.0 in | Wt 158.6 lb

## 2016-09-28 DIAGNOSIS — I252 Old myocardial infarction: Secondary | ICD-10-CM | POA: Insufficient documentation

## 2016-09-28 DIAGNOSIS — Z955 Presence of coronary angioplasty implant and graft: Secondary | ICD-10-CM | POA: Diagnosis not present

## 2016-09-28 DIAGNOSIS — L02432 Carbuncle of left axilla: Secondary | ICD-10-CM

## 2016-09-28 DIAGNOSIS — Z170 Estrogen receptor positive status [ER+]: Secondary | ICD-10-CM | POA: Diagnosis not present

## 2016-09-28 DIAGNOSIS — F418 Other specified anxiety disorders: Secondary | ICD-10-CM | POA: Insufficient documentation

## 2016-09-28 DIAGNOSIS — L0293 Carbuncle, unspecified: Secondary | ICD-10-CM | POA: Diagnosis not present

## 2016-09-28 DIAGNOSIS — Z79811 Long term (current) use of aromatase inhibitors: Secondary | ICD-10-CM

## 2016-09-28 DIAGNOSIS — Z803 Family history of malignant neoplasm of breast: Secondary | ICD-10-CM | POA: Insufficient documentation

## 2016-09-28 DIAGNOSIS — Z923 Personal history of irradiation: Secondary | ICD-10-CM | POA: Insufficient documentation

## 2016-09-28 DIAGNOSIS — Z9889 Other specified postprocedural states: Secondary | ICD-10-CM | POA: Insufficient documentation

## 2016-09-28 DIAGNOSIS — Z801 Family history of malignant neoplasm of trachea, bronchus and lung: Secondary | ICD-10-CM | POA: Insufficient documentation

## 2016-09-28 DIAGNOSIS — J449 Chronic obstructive pulmonary disease, unspecified: Secondary | ICD-10-CM | POA: Diagnosis not present

## 2016-09-28 DIAGNOSIS — E7800 Pure hypercholesterolemia, unspecified: Secondary | ICD-10-CM | POA: Diagnosis not present

## 2016-09-28 DIAGNOSIS — Z808 Family history of malignant neoplasm of other organs or systems: Secondary | ICD-10-CM | POA: Insufficient documentation

## 2016-09-28 DIAGNOSIS — Z9851 Tubal ligation status: Secondary | ICD-10-CM | POA: Diagnosis not present

## 2016-09-28 DIAGNOSIS — D0512 Intraductal carcinoma in situ of left breast: Secondary | ICD-10-CM

## 2016-09-28 DIAGNOSIS — Z9981 Dependence on supplemental oxygen: Secondary | ICD-10-CM | POA: Insufficient documentation

## 2016-09-28 DIAGNOSIS — Z8619 Personal history of other infectious and parasitic diseases: Secondary | ICD-10-CM | POA: Diagnosis not present

## 2016-09-28 DIAGNOSIS — Z7902 Long term (current) use of antithrombotics/antiplatelets: Secondary | ICD-10-CM | POA: Insufficient documentation

## 2016-09-28 DIAGNOSIS — Z87891 Personal history of nicotine dependence: Secondary | ICD-10-CM | POA: Insufficient documentation

## 2016-09-28 DIAGNOSIS — L739 Follicular disorder, unspecified: Secondary | ICD-10-CM

## 2016-09-28 DIAGNOSIS — I2510 Atherosclerotic heart disease of native coronary artery without angina pectoris: Secondary | ICD-10-CM | POA: Diagnosis not present

## 2016-09-28 LAB — CBC WITH DIFFERENTIAL/PLATELET
BASOS PCT: 0 %
Basophils Absolute: 0 10*3/uL (ref 0.0–0.1)
EOS ABS: 0.2 10*3/uL (ref 0.0–0.7)
EOS PCT: 2 %
HCT: 39.6 % (ref 36.0–46.0)
HEMOGLOBIN: 12.3 g/dL (ref 12.0–15.0)
LYMPHS ABS: 1.3 10*3/uL (ref 0.7–4.0)
Lymphocytes Relative: 14 %
MCH: 28.5 pg (ref 26.0–34.0)
MCHC: 31.1 g/dL (ref 30.0–36.0)
MCV: 91.9 fL (ref 78.0–100.0)
MONO ABS: 0.6 10*3/uL (ref 0.1–1.0)
MONOS PCT: 6 %
NEUTROS PCT: 78 %
Neutro Abs: 7.3 10*3/uL (ref 1.7–7.7)
Platelets: 262 10*3/uL (ref 150–400)
RBC: 4.31 MIL/uL (ref 3.87–5.11)
RDW: 13.2 % (ref 11.5–15.5)
WBC: 9.5 10*3/uL (ref 4.0–10.5)

## 2016-09-28 LAB — COMPREHENSIVE METABOLIC PANEL
ALK PHOS: 57 U/L (ref 38–126)
ALT: 16 U/L (ref 14–54)
AST: 19 U/L (ref 15–41)
Albumin: 4.2 g/dL (ref 3.5–5.0)
Anion gap: 6 (ref 5–15)
BILIRUBIN TOTAL: 0.5 mg/dL (ref 0.3–1.2)
BUN: 9 mg/dL (ref 6–20)
CO2: 38 mmol/L — ABNORMAL HIGH (ref 22–32)
CREATININE: 0.65 mg/dL (ref 0.44–1.00)
Calcium: 9.6 mg/dL (ref 8.9–10.3)
Chloride: 97 mmol/L — ABNORMAL LOW (ref 101–111)
GFR calc Af Amer: >60 mL/min (ref >60)
Glucose, Bld: 110 mg/dL — ABNORMAL HIGH (ref 65–99)
Potassium: 4.5 mmol/L (ref 3.5–5.1)
Sodium: 141 mmol/L (ref 135–145)
TOTAL PROTEIN: 7.1 g/dL (ref 6.5–8.1)

## 2016-09-28 NOTE — Progress Notes (Signed)
Marland Kitchen    HEMATOLOGY/ONCOLOGY CONSULTATION NOTE  Date of Service: 09/28/2016  Patient Care Team: [name], MD as PCP - General (Internal Medicine)  CHIEF COMPLAINTS/PURPOSE OF CONSULTATION:  Breast cancer  HISTORY OF PRESENTING ILLNESS:  Barbara Gibson is a wonderful 70 y.o. female who has been referred to [place] by Dr .[name], MD for evaluation and continued management of breast cancer.  Patient has a history of COPD (on chronic oxygen 3 L/m by nasal cannula, quit smoking about 3-1/2 years ago), coronary artery disease status post MI, recurrent basal cell carcinoma who was found to have a irregular mass on screening  mammogram on February 03, 2016. 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 Left breast biopsy additional tissue showed intraductal papilloma with sclerosis, atypical ductal hyperplasia, microcalcifications within the ductal epithelial hyperplasia. " - per Dr Rubin Payor note.  Patient reports that she had 2 breast abscesses requiring surgical drainage after her breast surgery and antibiotics. No sentinel lymph node biopsy. Patient was deemed not to be a good candidate for adjuvant radiation therapy. She was started on Arimidex and has tolerated this without any significant issues.  Patient subsequently developed C. difficile infection due to her antibiotic use and was in hospital for 2 weeks and was significantly debilitated and needed to go to a skilled nursing facility. She is now back home.  Patient notes that she previously had a basal cell carcinoma on her nose in 2016 and then recently had one on her back which was removed.  Patient notes that she has not noticed any new breast lumps. He was recently  evaluated by her primary care physician for folliculitis in her left armpit and has been started on Bactrim. On clinical examination she was noted to have some skin nodularity and possible small underlying lymph node likely reactive from her overlying skin infection.  No other acute symptoms at this time. Notes that she has adequate refills for her Arimidex at this time. No other acute new focal symptoms.  MEDICAL HISTORY:  Past Medical History:  Diagnosis Date  . Anxiety   . COPD (chronic obstructive pulmonary disease)   . Depression   . High cholesterol   . Hyperlipidemia   . Myocardial infarction ([place]) 10/22/2014   "mild"  . On home oxygen therapy    "2L; 24/7" (10/24/2014)  . Pneumonia 04/2013  COPD on 3 L of oxygen by nasal cannula  SURGICAL HISTORY: Past Surgical History:  Procedure Laterality Date  . CARDIAC CATHETERIZATION  10/24/2014  . LEFT HEART CATHETERIZATION WITH CORONARY ANGIOGRAM N/A 10/24/2014   Procedure: LEFT HEART CATHETERIZATION WITH CORONARY ANGIOGRAM;  Surgeon: Laverda Page, MD;  Location: [facility] CATH LAB;  Service: Cardiovascular;  Laterality: N/A;  . TUBAL LIGATION  1980's    SOCIAL HISTORY: Social History   Social History  . Marital status: Widowed    Spouse name: N/A  . Number of children: N/A  . Years of education: N/A   Occupational History  . Not on file.   Social History Main Topics  . Smoking status: Former Smoker    Packs/day: 1.50    Years: 52.00    Types: Cigarettes    Quit date: 12/23/2012  . Smokeless tobacco: Never Used  . Alcohol use No  .  Drug use: No  . Sexual activity: No   Other Topics Concern  . Not on file   Social History Narrative  . No narrative on file    FAMILY HISTORY: family history of breast cancer her mother at age 65, and maternal aunts 2 breast cancer, and lung cancer. Patient has 2 daughters and 2 boys. Data had salivary gland cancer    ALLERGIES:  has No Known Allergies.  MEDICATIONS:    Current Outpatient Prescriptions  Medication Sig Dispense Refill  . letrozole (FEMARA) 2.5 MG tablet Take by mouth.    . SULFAMETHOXAZOLE-TRIMETHOPRIM PO Take 1 tablet by mouth 2 (two) times daily.    Marland Kitchen albuterol (PROVENTIL) (2.5 MG/3ML) 0.083% nebulizer solution Take 2.5 mg by nebulization every 6 (six) hours as needed for wheezing or shortness of breath.    . ALPRAZolam (XANAX) 1 MG tablet Take 1 mg by mouth 3 (three) times daily as needed for anxiety.    . Biotin (BIOTIN MAXIMUM STRENGTH) 10 MG TABS Take 1 tablet by mouth daily.    . budesonide-formoterol (SYMBICORT) 160-4.5 MCG/ACT inhaler Inhale 1 puff into the lungs daily.    . calcium gluconate 500 MG tablet Take 1 tablet by mouth daily.    . citalopram (CELEXA) 20 MG tablet Take 20 mg by mouth daily.    . clopidogrel (PLAVIX) 75 MG tablet Take 1 tablet (75 mg total) by mouth daily. 30 tablet 6  . metoprolol tartrate (LOPRESSOR) 25 MG tablet Take 0.5 tablets (12.5 mg total) by mouth 2 (two) times daily. 60 tablet 1  . Multiple Vitamin (MULTIVITAMIN WITH MINERALS) TABS tablet Take 1 tablet by mouth daily.    . Omega-3 Fatty Acids (FISH OIL) 1000 MG CAPS Take 1 capsule by mouth 2 (two) times daily.    . simvastatin (ZOCOR) 40 MG tablet Take 0.5 tablets (20 mg total) by mouth every evening. 30 tablet 2  . Tiotropium Bromide Monohydrate 2.5 MCG/ACT AERS Inhale 18 mcg into the lungs 2 (two) times daily.      No current facility-administered medications for this visit.     REVIEW OF SYSTEMS:    10 Point review of Systems was done is negative except as noted above.  PHYSICAL EXAMINATION: ECOG PERFORMANCE STATUS: 3 - Symptomatic, >50% confined to bed  . Vitals:   09/28/16 1355  BP: (!) 154/63  Pulse: 86  Resp: 18  Temp: 98.3 F (36.8 C)   Filed Weights   09/28/16 1355  Weight: 158 lb 9 oz (71.9 kg)   .Body mass index is 25.59 kg/m.  GENERAL:alert, in no acute distress and comfortable SKIN: skin color, texture, turgor are  normal, no rashes or significant lesions EYES: normal, conjunctiva are pink and non-injected, sclera clear OROPHARYNX:no exudate, no erythema and lips, buccal mucosa, and tongue normal  NECK: supple, no JVD, thyroid normal size, non-tender, without nodularity LYMPH:  no palpable lymphadenopathy in the cervical, axillary or inguinal LUNGS: clear to auscultation with normal respiratory effort HEART: regular rate & rhythm,  no murmurs and chronic bilateral pedal edema ABDOMEN: abdomen soft, non-tender, normoactive bowel sounds  Musculoskeletal: no cyanosis of digits and no clubbing  PSYCH: alert & oriented x 3 with fluent speech NEURO: no focal motor/sensory deficits BREAST: No palpable breast lumps in either breast, previous healed changes of surgery and the left breast. Has some folliculitis with skin rash under her left armpit with some skin nodularity which could represent carbuncles. Also possible underlying reactive lymph node.    LABORATORY  DATA:  I have reviewed the data as listed  . CBC Latest Ref Rng & Units 09/28/2016 10/24/2014  WBC 4.0 - 10.5 K/uL 9.5 8.7  Hemoglobin 12.0 - 15.0 g/dL 12.3 11.2(L)  Hematocrit 36.0 - 46.0 % 39.6 36.7  Platelets 150 - 400 K/uL 262 213    . CMP Latest Ref Rng & Units 09/28/2016 10/24/2014  Glucose 65 - 99 mg/dL 110(H) 127(H)  BUN 6 - 20 mg/dL 9 7  Creatinine 0.44 - 1.00 mg/dL 0.65 0.66  Sodium 135 - 145 mmol/L 141 144  Potassium 3.5 - 5.1 mmol/L 4.5 4.1  Chloride 101 - 111 mmol/L 97(L) 105  CO2 22 - 32 mmol/L 38(H) 30  Calcium 8.9 - 10.3 mg/dL 9.6 8.6  Total Protein 6.5 - 8.1 g/dL 7.1 -  Total Bilirubin 0.3 - 1.2 mg/dL 0.5 -  Alkaline Phos 38 - 126 U/L 57 -  AST 15 - 41 U/L 19 -  ALT 14 - 54 U/L 16 -      RADIOGRAPHIC STUDIES: I have personally reviewed the radiological images as listed and agreed with the findings in the report. No results found.  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 Status post lumpectomy with postoperative breast abscess treated with antibiotics x 2. No postoperative radiation therapy due to her cardiac issues and oxygen-dependent COPD. Plan -Continue adjuvant endocrine treatment with Arimidex 1 mg by mouth daily. -She notes that she has had a bone density scan at [facility] we shall need to obtain that for her records. -Continue calcium and vitamin D with at least 2000 units of vitamin D daily. -Encouraged her to be physically active.  #2 left armpit folliculitis/infected carbuncle with possible underlying reactive lymph node. Plan -Patient is starting Bactrim today as per her primary care physician. -We shall see her back in a month with axillary ultrasound to rule out any other persistent lymphadenopathy that might need a biopsy.  #3 oxygen dependent COPD. Elevated bicarbonate concerning for chronic CO2 retention.  #4 coronary disease status post MI  #5 history of C. difficile colitis. Currently taking probiotics along with her oral antibiotics.  All of the patients questions were answered with apparent satisfaction. The patient knows to call the clinic with any problems, questions or concerns.  I spent 45 minutes counseling the patient face to face. The total time spent in the appointment was 60 minutes and more than 50% was on counseling and direct patient cares.    Sullivan Lone MD [place] AAHIVMS [facility] Hematology/Oncology Physician [facility]  (Office):       [phone] (Work cell):  [phone] (Fax):           [phone]  09/28/2016 1:08 PM

## 2016-09-28 NOTE — Patient Instructions (Addendum)
[place] at [facility]  Discharge Instructions:  Seen by MD Irene Limbo.  Labs today.  Follow up with MD Penland in 1 month with ultrasound _______________________________________________________________  Thank you for choosing [place] at [facility] to provide your oncology and hematology care.  To afford each patient quality time with our providers, please arrive at least 15 minutes before your scheduled appointment.  You need to re-schedule your appointment if you arrive 10 or more minutes late.  We strive to give you quality time with our providers, and arriving late affects you and other patients whose appointments are after yours.  Also, if you no show three or more times for appointments you may be dismissed from the clinic.  Again, thank you for choosing [place] at [place] hope is that these requests will allow you access to exceptional care and in a timely manner. _______________________________________________________________  If you have questions after your visit, please contact our office at [phone] between the hours of 8:30 a.m. and 5:00 p.m. Voicemails left after 4:30 p.m. will not be returned until the following business day. _______________________________________________________________  For prescription refill requests, have your pharmacy contact our office. _______________________________________________________________  Recommendations made by the consultant and any test results will be sent to your referring physician. _______________________________________________________________

## 2016-09-30 DIAGNOSIS — I10 Essential (primary) hypertension: Secondary | ICD-10-CM | POA: Diagnosis not present

## 2016-09-30 DIAGNOSIS — M158 Other polyosteoarthritis: Secondary | ICD-10-CM | POA: Diagnosis not present

## 2016-09-30 DIAGNOSIS — J441 Chronic obstructive pulmonary disease with (acute) exacerbation: Secondary | ICD-10-CM | POA: Diagnosis not present

## 2016-10-03 DIAGNOSIS — L02412 Cutaneous abscess of left axilla: Secondary | ICD-10-CM | POA: Diagnosis not present

## 2016-10-18 ENCOUNTER — Other Ambulatory Visit (HOSPITAL_COMMUNITY): Payer: Self-pay | Admitting: Hematology & Oncology

## 2016-10-18 DIAGNOSIS — Z9889 Other specified postprocedural states: Secondary | ICD-10-CM

## 2016-10-28 ENCOUNTER — Ambulatory Visit (HOSPITAL_COMMUNITY): Payer: Medicare Other | Admitting: Hematology & Oncology

## 2016-11-01 ENCOUNTER — Encounter (HOSPITAL_COMMUNITY): Payer: Medicare Other

## 2016-11-01 ENCOUNTER — Ambulatory Visit (HOSPITAL_COMMUNITY): Payer: Medicare Other

## 2016-11-03 ENCOUNTER — Encounter (HOSPITAL_COMMUNITY): Payer: Medicare Other | Attending: Hematology | Admitting: Hematology & Oncology

## 2016-11-03 ENCOUNTER — Encounter (HOSPITAL_COMMUNITY): Payer: Self-pay | Admitting: Hematology & Oncology

## 2016-11-03 VITALS — BP 156/61 | HR 89 | Temp 98.0°F | Resp 20 | Ht 66.0 in | Wt 165.0 lb

## 2016-11-03 DIAGNOSIS — D0512 Intraductal carcinoma in situ of left breast: Secondary | ICD-10-CM | POA: Diagnosis not present

## 2016-11-03 DIAGNOSIS — Z955 Presence of coronary angioplasty implant and graft: Secondary | ICD-10-CM | POA: Insufficient documentation

## 2016-11-03 DIAGNOSIS — Z87891 Personal history of nicotine dependence: Secondary | ICD-10-CM | POA: Insufficient documentation

## 2016-11-03 DIAGNOSIS — E7800 Pure hypercholesterolemia, unspecified: Secondary | ICD-10-CM | POA: Insufficient documentation

## 2016-11-03 DIAGNOSIS — Z923 Personal history of irradiation: Secondary | ICD-10-CM | POA: Insufficient documentation

## 2016-11-03 DIAGNOSIS — Z170 Estrogen receptor positive status [ER+]: Secondary | ICD-10-CM | POA: Insufficient documentation

## 2016-11-03 DIAGNOSIS — Z79811 Long term (current) use of aromatase inhibitors: Secondary | ICD-10-CM | POA: Diagnosis not present

## 2016-11-03 DIAGNOSIS — Z801 Family history of malignant neoplasm of trachea, bronchus and lung: Secondary | ICD-10-CM | POA: Insufficient documentation

## 2016-11-03 DIAGNOSIS — Z9981 Dependence on supplemental oxygen: Secondary | ICD-10-CM | POA: Insufficient documentation

## 2016-11-03 DIAGNOSIS — C50912 Malignant neoplasm of unspecified site of left female breast: Secondary | ICD-10-CM

## 2016-11-03 DIAGNOSIS — F418 Other specified anxiety disorders: Secondary | ICD-10-CM | POA: Insufficient documentation

## 2016-11-03 DIAGNOSIS — I2510 Atherosclerotic heart disease of native coronary artery without angina pectoris: Secondary | ICD-10-CM | POA: Insufficient documentation

## 2016-11-03 DIAGNOSIS — J449 Chronic obstructive pulmonary disease, unspecified: Secondary | ICD-10-CM | POA: Insufficient documentation

## 2016-11-03 DIAGNOSIS — Z7902 Long term (current) use of antithrombotics/antiplatelets: Secondary | ICD-10-CM | POA: Insufficient documentation

## 2016-11-03 DIAGNOSIS — Z803 Family history of malignant neoplasm of breast: Secondary | ICD-10-CM | POA: Insufficient documentation

## 2016-11-03 DIAGNOSIS — I252 Old myocardial infarction: Secondary | ICD-10-CM | POA: Insufficient documentation

## 2016-11-03 DIAGNOSIS — Z808 Family history of malignant neoplasm of other organs or systems: Secondary | ICD-10-CM | POA: Insufficient documentation

## 2016-11-03 DIAGNOSIS — Z9889 Other specified postprocedural states: Secondary | ICD-10-CM | POA: Insufficient documentation

## 2016-11-03 DIAGNOSIS — Z8619 Personal history of other infectious and parasitic diseases: Secondary | ICD-10-CM | POA: Insufficient documentation

## 2016-11-03 DIAGNOSIS — L0293 Carbuncle, unspecified: Secondary | ICD-10-CM | POA: Insufficient documentation

## 2016-11-03 DIAGNOSIS — Z9851 Tubal ligation status: Secondary | ICD-10-CM | POA: Insufficient documentation

## 2016-11-03 DIAGNOSIS — L739 Follicular disorder, unspecified: Secondary | ICD-10-CM | POA: Insufficient documentation

## 2016-11-03 NOTE — Progress Notes (Signed)
HEMATOLOGY/ONCOLOGY PROGRESS NOTE  Date of Service: 11/03/2016  Patient Care Team: [name], MD as PCP - General (Internal Medicine)  CHIEF COMPLAINTS/PURPOSE OF CONSULTATION:  Breast cancer  HISTORY OF PRESENTING ILLNESS:  Barbara Gibson is a wonderful 70 y.o. female who is here for a follow up of breast cancer. Referred by Dr .[name], MD   Patient has a history of COPD (on chronic oxygen 3 L/m by nasal cannula, quit smoking about 3-1/2 years ago), coronary artery disease status post MI, recurrent basal cell carcinoma who was found to have a irregular mass on screening  mammogram on February 03, 2016. 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 On March 09, 2016 she underwent left breast lumpectomy which apparently showed "only inflammatory and reactive changes at site of previous biopsy, ductal epithelial hyperplasia, no residual ductal carcinoma in situ. Left breast biopsy additional tissue showed intraductal papilloma with sclerosis, atypical ductal hyperplasia, microcalcifications within the ductal epithelial hyperplasia. " - per Dr Rubin Payor note.  Patient is unaccompanied today. She is wearing oxygen. Raelie is tolerating Arimidex really well, aside from hair thinning.   The areas under her left arm have improved. Denies any issues with her breasts.   Patient tries to walk as much as she physically can. She has noticed some swelling in her ankles during the day. Swelling has improved in the morning.   She typically lives alone, but her son has temporarily moved in with her. Daughter visits frequently and assists her with house chores.   Next mammogram is currently scheduled for February 2018. She would prefer to have her mammogram done at [facility].   She has not had a colonoscopy and opts not to have one. Denies abdominal pain or hematochezia.    MEDICAL HISTORY:  Past Medical History:  Diagnosis Date  . Anxiety    . COPD (chronic obstructive pulmonary disease) ([place])   . Depression   . High cholesterol   . Hyperlipidemia   . Myocardial infarction 10/22/2014   "mild"  . On home oxygen therapy    "2L; 24/7" (10/24/2014)  . Pneumonia 04/2013  COPD on 3 L of oxygen by nasal cannula  SURGICAL HISTORY: Past Surgical History:  Procedure Laterality Date  . CARDIAC CATHETERIZATION  10/24/2014  . LEFT HEART CATHETERIZATION WITH CORONARY ANGIOGRAM N/A 10/24/2014   Procedure: LEFT HEART CATHETERIZATION WITH CORONARY ANGIOGRAM;  Surgeon: Laverda Page, MD;  Location: [facility] CATH LAB;  Service: Cardiovascular;  Laterality: N/A;  . TUBAL LIGATION  1980's    SOCIAL HISTORY: Social History   Social History  . Marital status: Widowed    Spouse name: N/A  . Number of children: N/A  . Years of education: N/A   Occupational History  . Not on file.   Social History Main Topics  . Smoking status: Former Smoker    Packs/day: 1.50    Years: 52.00    Types: Cigarettes    Quit date: 12/23/2012  . Smokeless tobacco: Never Used  . Alcohol use No  . Drug use: No  . Sexual activity: No   Other Topics Concern  . Not on file   Social History Narrative  . No narrative on file    FAMILY HISTORY: family history of breast cancer her mother at age 74, and maternal aunts 2 breast cancer, and lung cancer. Patient has 2 daughters and 2 boys. 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  MEDICATIONS:  Current Outpatient Prescriptions  Medication Sig Dispense Refill  . albuterol (PROVENTIL) (2.5 MG/3ML) 0.083% nebulizer solution Take 2.5 mg by nebulization every 6 (six) hours as needed for wheezing or shortness of breath.    . ALPRAZolam (XANAX) 1 MG tablet Take 1 mg by mouth 3 (three) times daily as needed for anxiety.    . Biotin (BIOTIN MAXIMUM STRENGTH) 10 MG TABS Take 1 tablet by mouth daily.    . calcium gluconate 500 MG tablet Take 1 tablet by mouth daily.    . citalopram  (CELEXA) 20 MG tablet Take 20 mg by mouth daily.    . clopidogrel (PLAVIX) 75 MG tablet Take 1 tablet (75 mg total) by mouth daily. 30 tablet 6  . letrozole (FEMARA) 2.5 MG tablet Take by mouth.    . metoprolol tartrate (LOPRESSOR) 25 MG tablet Take 0.5 tablets (12.5 mg total) by mouth 2 (two) times daily. 60 tablet 1  . Multiple Vitamin (MULTIVITAMIN WITH MINERALS) TABS tablet Take 1 tablet by mouth daily.    . Omega-3 Fatty Acids (FISH OIL) 1000 MG CAPS Take 1 capsule by mouth 2 (two) times daily.    . simvastatin (ZOCOR) 40 MG tablet Take 0.5 tablets (20 mg total) by mouth every evening. 30 tablet 2  . SULFAMETHOXAZOLE-TRIMETHOPRIM PO Take 1 tablet by mouth 2 (two) times daily.    . Tiotropium Bromide Monohydrate 2.5 MCG/ACT AERS Inhale 18 mcg into the lungs 2 (two) times daily.      No current facility-administered medications for this visit.     REVIEW OF SYSTEMS:   Positive for hair loss. Positive for edema 14 point review of systems was performed and is negative except as detailed under history of present illness and above   PHYSICAL EXAMINATION: ECOG PERFORMANCE STATUS: 3 - Symptomatic, >50% confined to bed  . Vitals:   11/03/16 1248  BP: (!) 156/61  Pulse: 89  Resp: 20  Temp: 98 F (36.7 C)   Filed Weights   11/03/16 1248  Weight: 165 lb (74.8 kg)   .Body mass index is 26.63 kg/m.  GENERAL:alert, in no acute distress and comfortable SKIN: skin color, texture, turgor are normal, no rashes or significant lesions EYES: normal, conjunctiva are pink and non-injected, sclera clear OROPHARYNX:no exudate, no erythema and lips, buccal mucosa, and tongue normal  NECK: supple, no JVD, thyroid normal size, non-tender, without nodularity LYMPH:  no palpable lymphadenopathy in the cervical, axillary or inguinal LUNGS: clear to auscultation with normal respiratory effort HEART: regular rate & rhythm,  no murmurs and chronic bilateral pedal edema ABDOMEN: abdomen soft,  non-tender, normoactive bowel sounds  Musculoskeletal: no cyanosis of digits and no clubbing  PSYCH: alert & oriented x 3 with fluent speech NEURO: no focal motor/sensory deficits   LABORATORY DATA:  I have reviewed the data as listed  . CBC Latest Ref Rng & Units 09/28/2016 10/24/2014  WBC 4.0 - 10.5 K/uL 9.5 8.7  Hemoglobin 12.0 - 15.0 g/dL 12.3 11.2(L)  Hematocrit 36.0 - 46.0 % 39.6 36.7  Platelets 150 - 400 K/uL 262 213    . CMP Latest Ref Rng & Units 09/28/2016 10/24/2014  Glucose 65 - 99 mg/dL 110(H) 127(H)  BUN 6 - 20 mg/dL 9 7  Creatinine 0.44 - 1.00 mg/dL 0.65 0.66  Sodium 135 - 145 mmol/L 141 144  Potassium 3.5 - 5.1 mmol/L 4.5 4.1  Chloride 101 - 111 mmol/L 97(L) 105  CO2 22 - 32 mmol/L 38(H) 30  Calcium 8.9 - 10.3 mg/dL 9.6 8.6  Total Protein 6.5 - 8.1 g/dL 7.1 -  Total Bilirubin 0.3 - 1.2 mg/dL 0.5 -  Alkaline Phos 38 - 126 U/L 57 -  AST 15 - 41 U/L 19 -  ALT 14 - 54 U/L 16 -      RADIOGRAPHIC STUDIES: I have personally reviewed the radiological images as listed and agreed with the findings in the report. No results found.  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 Status post lumpectomy with postoperative breast abscess treated with antibiotics x 2. No postoperative radiation therapy due to her cardiac issues and oxygen-dependent COPD. Plan -Continue adjuvant endocrine treatment with Arimidex 1 mg by mouth daily. -She prefers to have her next mammogram done at [facility]. I have placed an order for one in March. I also explained that she will need to have a diagnostic mammogram each years for seven years after breast cancer diagnosis.  -She notes that she has had a bone density scan at [facility] we shall need to obtain that for her records. Patient had a bone density exam. I have not received results yet. I will  call her with the results when I receive them. -Continue calcium and vitamin D with at least 2000 units of vitamin D daily. -Encouraged her to be physically active.  #2 oxygen dependent COPD. Elevated bicarbonate concerning for chronic CO2 retention.  #3 coronary disease status post MI  #4 history of C. difficile colitis. Currently taking probiotics along with her oral antibiotics.  I encouraged her to keep me updated regarding her hair thinning. If symptoms worsen, she knows to call Sharyn Lull, our nurse, or Anderson Malta, our patient navigator.  She declines to have a colonoscopy, so I have sent her home with stool cards.   When Caylan returns for her follow-up, I will conduct a breast exam.  Orders Placed This Encounter  Procedures  . MM DIAG BREAST TOMO BILATERAL    Lumpectomy 03-17-16    Standing Status:   Future    Standing Expiration Date:   01/03/2018    Order Specific Question:   Reason for Exam (SYMPTOM  OR DIAGNOSIS REQUIRED)    Answer:   history breast cancer    Order Specific Question:   Preferred imaging location?    Answer:   [facility]    All of the patients questions were answered with apparent satisfaction. The patient knows to call the clinic with any problems, questions or concerns.  This document serves as a record of services personally performed by Ancil Linsey, MD. It was created on her behalf by Elmyra Ricks, a trained medical scribe. The creation of this record is based on the scribe's personal observations and the provider's statements to them. This document has been checked and approved by the attending provider.  I have reviewed the above documentation for accuracy and completeness and I agree with the above.  Kelby Fam. Whitney Muse, MD

## 2016-11-03 NOTE — Patient Instructions (Signed)
[place] at [facility] Discharge Instructions  RECOMMENDATIONS MADE BY THE CONSULTANT AND ANY TEST RESULTS WILL BE SENT TO YOUR REFERRING PHYSICIAN.  You saw Dr.Penland today. Diagnostic mammogram in February. Follow up in 3 months. See Amy at checkout for appointments.   Thank you for choosing [place] at [facility] to provide your oncology and hematology care.  To afford each patient quality time with our provider, please arrive at least 15 minutes before your scheduled appointment time.   Beginning January 23rd 2017 lab work for the [facility] will be done in the  Main lab at [facility] on 1st floor. If you have a lab appointment with the [place] please come in thru the  Main Entrance and check in at the main information desk  You need to re-schedule your appointment should you arrive 10 or more minutes late.  We strive to give you quality time with our providers, and arriving late affects you and other patients whose appointments are after yours.  Also, if you no show three or more times for appointments you may be dismissed from the clinic at the providers discretion.     Again, thank you for choosing [facility].  Our hope is that these requests will decrease the amount of time that you wait before being seen by our physicians.       _____________________________________________________________  Should you have questions after your visit to [facility], please contact our office at [phone] between the hours of 8:30 a.m. and 4:30 p.m.  Voicemails left after 4:30 p.m. will not be returned until the following business day.  For prescription refill requests, have your pharmacy contact our office.         Resources For Cancer Patients and their Caregivers ? American Cancer Society: Can assist with transportation, wigs, general needs, runs Look Good Feel Better.         ([phone] 440-9296 ? Cancer Care: Provides financial assistance, online support groups, medication/co-pay assistance.  1-800-813-HOPE 408-758-9031) ? [facility] Assists [place] cancer patients and their families through emotional , educational and financial support.  (502)687-8808 ? [place] DSS Where to apply for food stamps, Medicaid and utility assistance. (931) 793-1705 ? [facility]: Transportation to medical appointments. 7691259460 ? Social Security Administration: May apply for disability if have a Stage IV cancer. (419)651-6690 213 258 5296 ? [employer], Disability and Transit Services: Assists with nutrition, care and transit needs. [place] Support Programs: '@10RELATIVEDAYS'$ @ > Cancer Support Group  2nd Tuesday of the month 1pm-2pm, Journey Room  > Creative Journey  3rd Tuesday of the month 1130am-1pm, Journey Room  > Look Good Feel Better  1st Wednesday of the month 10am-12 noon, Journey Room (Call [place] to register [phone])

## 2016-11-15 DIAGNOSIS — J440 Chronic obstructive pulmonary disease with acute lower respiratory infection: Secondary | ICD-10-CM | POA: Diagnosis not present

## 2016-11-15 DIAGNOSIS — I10 Essential (primary) hypertension: Secondary | ICD-10-CM | POA: Diagnosis not present

## 2016-11-15 DIAGNOSIS — Z1389 Encounter for screening for other disorder: Secondary | ICD-10-CM | POA: Diagnosis not present

## 2016-11-15 DIAGNOSIS — Z0000 Encounter for general adult medical examination without abnormal findings: Secondary | ICD-10-CM | POA: Diagnosis not present

## 2016-11-23 ENCOUNTER — Other Ambulatory Visit (HOSPITAL_COMMUNITY): Payer: Self-pay | Admitting: Oncology

## 2016-11-23 DIAGNOSIS — Z853 Personal history of malignant neoplasm of breast: Secondary | ICD-10-CM

## 2016-11-23 DIAGNOSIS — Z9889 Other specified postprocedural states: Secondary | ICD-10-CM

## 2016-12-07 DIAGNOSIS — I10 Essential (primary) hypertension: Secondary | ICD-10-CM | POA: Diagnosis not present

## 2016-12-07 DIAGNOSIS — J441 Chronic obstructive pulmonary disease with (acute) exacerbation: Secondary | ICD-10-CM | POA: Diagnosis not present

## 2016-12-07 DIAGNOSIS — M158 Other polyosteoarthritis: Secondary | ICD-10-CM | POA: Diagnosis not present

## 2016-12-26 ENCOUNTER — Encounter (HOSPITAL_COMMUNITY): Payer: Self-pay | Admitting: Hematology & Oncology

## 2017-01-06 DIAGNOSIS — I10 Essential (primary) hypertension: Secondary | ICD-10-CM | POA: Diagnosis not present

## 2017-01-06 DIAGNOSIS — M158 Other polyosteoarthritis: Secondary | ICD-10-CM | POA: Diagnosis not present

## 2017-01-06 DIAGNOSIS — J441 Chronic obstructive pulmonary disease with (acute) exacerbation: Secondary | ICD-10-CM | POA: Diagnosis not present

## 2017-01-31 ENCOUNTER — Ambulatory Visit (HOSPITAL_COMMUNITY): Payer: Medicare Other | Admitting: Hematology & Oncology

## 2017-01-31 DIAGNOSIS — I10 Essential (primary) hypertension: Secondary | ICD-10-CM | POA: Diagnosis not present

## 2017-01-31 DIAGNOSIS — J441 Chronic obstructive pulmonary disease with (acute) exacerbation: Secondary | ICD-10-CM | POA: Diagnosis not present

## 2017-01-31 DIAGNOSIS — M158 Other polyosteoarthritis: Secondary | ICD-10-CM | POA: Diagnosis not present

## 2017-02-07 ENCOUNTER — Encounter (HOSPITAL_COMMUNITY): Payer: Medicare Other

## 2017-02-07 ENCOUNTER — Ambulatory Visit (HOSPITAL_COMMUNITY)
Admission: RE | Admit: 2017-02-07 | Discharge: 2017-02-07 | Disposition: A | Discharge status: Home / Self Care | Payer: Medicare Other | Source: Ambulatory Visit | Attending: Oncology | Admitting: Oncology

## 2017-02-07 DIAGNOSIS — Z9889 Other specified postprocedural states: Secondary | ICD-10-CM | POA: Diagnosis not present

## 2017-02-07 DIAGNOSIS — R928 Other abnormal and inconclusive findings on diagnostic imaging of breast: Secondary | ICD-10-CM | POA: Diagnosis not present

## 2017-02-07 DIAGNOSIS — Z853 Personal history of malignant neoplasm of breast: Secondary | ICD-10-CM | POA: Diagnosis not present

## 2017-02-13 DIAGNOSIS — F338 Other recurrent depressive disorders: Secondary | ICD-10-CM | POA: Diagnosis not present

## 2017-02-13 DIAGNOSIS — I10 Essential (primary) hypertension: Secondary | ICD-10-CM | POA: Diagnosis not present

## 2017-02-13 DIAGNOSIS — Z131 Encounter for screening for diabetes mellitus: Secondary | ICD-10-CM | POA: Diagnosis not present

## 2017-02-13 DIAGNOSIS — J440 Chronic obstructive pulmonary disease with acute lower respiratory infection: Secondary | ICD-10-CM | POA: Diagnosis not present

## 2017-02-14 ENCOUNTER — Encounter (HOSPITAL_COMMUNITY): Payer: Medicare Other | Attending: Hematology | Admitting: Hematology

## 2017-02-14 ENCOUNTER — Encounter (HOSPITAL_COMMUNITY): Payer: Medicare Other

## 2017-02-14 ENCOUNTER — Encounter (HOSPITAL_COMMUNITY): Payer: Self-pay | Admitting: Hematology

## 2017-02-14 VITALS — BP 113/97 | HR 76 | Temp 97.6°F | Resp 20 | Wt 165.2 lb

## 2017-02-14 DIAGNOSIS — Z170 Estrogen receptor positive status [ER+]: Principal | ICD-10-CM

## 2017-02-14 DIAGNOSIS — Z9981 Dependence on supplemental oxygen: Secondary | ICD-10-CM | POA: Diagnosis not present

## 2017-02-14 DIAGNOSIS — C50912 Malignant neoplasm of unspecified site of left female breast: Secondary | ICD-10-CM | POA: Diagnosis not present

## 2017-02-14 DIAGNOSIS — Z79811 Long term (current) use of aromatase inhibitors: Secondary | ICD-10-CM | POA: Diagnosis not present

## 2017-02-14 DIAGNOSIS — D0512 Intraductal carcinoma in situ of left breast: Secondary | ICD-10-CM | POA: Diagnosis not present

## 2017-02-14 DIAGNOSIS — J449 Chronic obstructive pulmonary disease, unspecified: Secondary | ICD-10-CM

## 2017-02-14 LAB — COMPREHENSIVE METABOLIC PANEL
ALK PHOS: 59 U/L (ref 38–126)
ALT: 14 U/L (ref 14–54)
ANION GAP: 7 (ref 5–15)
AST: 17 U/L (ref 15–41)
Albumin: 4.2 g/dL (ref 3.5–5.0)
BILIRUBIN TOTAL: 0.5 mg/dL (ref 0.3–1.2)
BUN: 10 mg/dL (ref 6–20)
CALCIUM: 10.1 mg/dL (ref 8.9–10.3)
CO2: 39 mmol/L — ABNORMAL HIGH (ref 22–32)
Chloride: 94 mmol/L — ABNORMAL LOW (ref 101–111)
Creatinine, Ser: 0.67 mg/dL (ref 0.44–1.00)
GFR calc non Af Amer: >60 mL/min (ref >60)
Glucose, Bld: 110 mg/dL — ABNORMAL HIGH (ref 65–99)
Potassium: 4.5 mmol/L (ref 3.5–5.1)
Sodium: 140 mmol/L (ref 135–145)
TOTAL PROTEIN: 7 g/dL (ref 6.5–8.1)

## 2017-02-14 LAB — CBC WITH DIFFERENTIAL/PLATELET
Basophils Absolute: 0 10*3/uL (ref 0.0–0.1)
Basophils Relative: 0 %
EOS ABS: 0.1 10*3/uL (ref 0.0–0.7)
Eosinophils Relative: 2 %
HEMATOCRIT: 39.5 % (ref 36.0–46.0)
HEMOGLOBIN: 12.6 g/dL (ref 12.0–15.0)
LYMPHS ABS: 1.7 10*3/uL (ref 0.7–4.0)
Lymphocytes Relative: 25 %
MCH: 29 pg (ref 26.0–34.0)
MCHC: 31.9 g/dL (ref 30.0–36.0)
MCV: 91 fL (ref 78.0–100.0)
MONOS PCT: 8 %
Monocytes Absolute: 0.6 10*3/uL (ref 0.1–1.0)
NEUTROS ABS: 4.4 10*3/uL (ref 1.7–7.7)
NEUTROS PCT: 65 %
Platelets: 253 10*3/uL (ref 150–400)
RBC: 4.34 MIL/uL (ref 3.87–5.11)
RDW: 12.6 % (ref 11.5–15.5)
WBC: 6.8 10*3/uL (ref 4.0–10.5)

## 2017-02-14 MED ORDER — LETROZOLE 2.5 MG PO TABS: 2.5000 mg | ORAL_TABLET | Freq: Every day | ORAL | 11 refills | Status: AC

## 2017-02-14 NOTE — Patient Instructions (Signed)
[place] at [facility] Discharge Instructions  RECOMMENDATIONS MADE BY THE CONSULTANT AND ANY TEST RESULTS WILL BE SENT TO YOUR REFERRING PHYSICIAN.  You were seen today by Dr. Irene Limbo Lab work today Follow up in 3 months with labs See Amy up front for appointments   Thank you for choosing [place] at [facility] to provide your oncology and hematology care.  To afford each patient quality time with our provider, please arrive at least 15 minutes before your scheduled appointment time.    If you have a lab appointment with the [facility] please come in thru the  Main Entrance and check in at the main information desk  You need to re-schedule your appointment should you arrive 10 or more minutes late.  We strive to give you quality time with our providers, and arriving late affects you and other patients whose appointments are after yours.  Also, if you no show three or more times for appointments you may be dismissed from the clinic at the providers discretion.     Again, thank you for choosing [facility].  Our hope is that these requests will decrease the amount of time that you wait before being seen by our physicians.       _____________________________________________________________  Should you have questions after your visit to [facility], please contact our office at [phone] between the hours of 8:30 a.m. and 4:30 p.m.  Voicemails left after 4:30 p.m. will not be returned until the following business day.  For prescription refill requests, have your pharmacy contact our office.       Resources For Cancer Patients and their Caregivers ? American Cancer Society: Can assist with transportation, wigs, general needs, runs Look Good Feel Better.        8132844593 ? Cancer Care: Provides financial assistance, online support groups, medication/co-pay assistance.  1-800-813-HOPE (856)213-6265) ? [place] Assists [place] cancer patients and their families through emotional , educational and financial support.  240-011-1347 ? [place] DSS Where to apply for food stamps, Medicaid and utility assistance. 702-698-0168 ? [facility]: Transportation to medical appointments. 9868429555 ? Social Security Administration: May apply for disability if have a Stage IV cancer. (785) 801-3691 312-352-6324 ? [employer], Disability and Transit Services: Assists with nutrition, care and transit needs. [place] Support Programs: '@10RELATIVEDAYS'$ @ > Cancer Support Group  2nd Tuesday of the month 1pm-2pm, Journey Room  > Creative Journey  3rd Tuesday of the month 1130am-1pm, Journey Room  > Look Good Feel Better  1st Wednesday of the month 10am-12 noon, Journey Room (Call Edna to register [phone])

## 2017-02-19 NOTE — Progress Notes (Signed)
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 Status post lumpectomy with postoperative breast abscess treated with antibiotics x 2. No postoperative radiation therapy due to her cardiac issues and oxygen-dependent COPD  Current Treatment: Arimidex 1 mg by mouth daily  INTERVAL HISTORY:  Patient is here for her scheduled 3 month follow-up for left breast DCIS. She notes no new breast lumps that she could feel. Recent mammogram done on 02/07/2017 show no mammographic evidence of breast cancer in either breast. Tolerating Arimidex without any prohibitive toxicities. No acute new concerns.  REVIEW OF SYSTEMS:    10 Point review of systems of done and is negative except as noted above.  . Past Medical History:  Diagnosis Date  . Anxiety   . COPD (chronic obstructive pulmonary disease) ([place])   . Depression   . High cholesterol   . Hyperlipidemia   . Myocardial infarction 10/22/2014   "mild"  . On home oxygen therapy    "2L; 24/7" (10/24/2014)  . Pneumonia 04/2013    . Past Surgical History:  Procedure Laterality Date  . CARDIAC CATHETERIZATION  10/24/2014  . LEFT HEART CATHETERIZATION WITH CORONARY ANGIOGRAM N/A 10/24/2014   Procedure: LEFT HEART CATHETERIZATION WITH CORONARY ANGIOGRAM;  Surgeon: Laverda Page, MD;  Location: [facility] CATH LAB;  Service: Cardiovascular;  Laterality: N/A;  . TUBAL LIGATION  1980's    . Social History  Substance Use Topics  . Smoking status: Former Smoker    Packs/day: 1.50    Years: 52.00    Types: Cigarettes    Quit date: 12/23/2012  . Smokeless tobacco: Never Used  . Alcohol use No    ALLERGIES:  has No Known Allergies.  MEDICATIONS:  Current Outpatient Prescriptions  Medication Sig Dispense  Refill  . albuterol (PROVENTIL) (2.5 MG/3ML) 0.083% nebulizer solution Take 2.5 mg by nebulization every 6 (six) hours as needed for wheezing or shortness of breath.    . ALPRAZolam (XANAX) 1 MG tablet Take 1 mg by mouth 3 (three) times daily as needed for anxiety.    . Biotin (BIOTIN MAXIMUM STRENGTH) 10 MG TABS Take 1 tablet by mouth daily.    . calcium gluconate 500 MG tablet Take 1 tablet by mouth daily.    . citalopram (CELEXA) 20 MG tablet Take 20 mg by mouth daily.    . clopidogrel (PLAVIX) 75 MG tablet Take 1 tablet (75 mg total) by mouth daily. 30 tablet 6  . letrozole (FEMARA) 2.5 MG tablet Take 1 tablet (2.5 mg total) by mouth daily. 30 tablet 11  . metoprolol tartrate (LOPRESSOR) 25 MG tablet Take 0.5 tablets (12.5 mg total) by mouth 2 (two) times daily. 60 tablet 1  . Multiple Vitamin (MULTIVITAMIN WITH MINERALS) TABS tablet Take 1 tablet by mouth daily.    . Omega-3 Fatty Acids (FISH OIL) 1000 MG CAPS Take 1 capsule by mouth 2 (two) times daily.    . simvastatin (ZOCOR) 20 MG tablet     . Tiotropium Bromide Monohydrate 2.5 MCG/ACT AERS Inhale 18 mcg into the lungs 2 (two) times daily.      No current facility-administered medications for this visit.     PHYSICAL EXAMINATION: ECOG PERFORMANCE STATUS: 2 - Symptomatic, <50% confined to bed  . Vitals:   02/14/17 1403  BP: Barbara Gibson)  113/97  Pulse: 76  Resp: 20  Temp: 97.6 F (36.4 C)    Filed Weights   02/14/17 1403  Weight: 165 lb 3.2 oz (74.9 kg)   .Body mass index is 26.66 kg/m.  GENERAL:alert, in no acute distress and comfortable SKIN: no acute rashes, no significant lesions EYES: conjunctiva are pink and non-injected, sclera anicteric OROPHARYNX: MMM, no exudates, no oropharyngeal erythema or ulceration NECK: supple, no JVD LYMPH:  no palpable lymphadenopathy in the cervical, axillary or inguinal regions LUNGS: clear to auscultation b/l with normal respiratory effort BREAST: Left breast healed lumpectomy scar, no  palpable breast nodules in either breast. No palpable regional lymphadenopathy. HEART: regular rate & rhythm ABDOMEN:  normoactive bowel sounds , non tender, not distended. Extremity: no pedal edema PSYCH: alert & oriented x 3 with fluent speech NEURO: no focal motor/sensory deficits  LABORATORY DATA:   I have reviewed the data as listed  . CBC Latest Ref Rng & Units 02/14/2017 09/28/2016 10/24/2014  WBC 4.0 - 10.5 K/uL 6.8 9.5 8.7  Hemoglobin 12.0 - 15.0 g/dL 12.6 12.3 11.2(L)  Hematocrit 36.0 - 46.0 % 39.5 39.6 36.7  Platelets 150 - 400 K/uL 253 262 213    . CMP Latest Ref Rng & Units 02/14/2017 09/28/2016 10/24/2014  Glucose 65 - 99 mg/dL 110(H) 110(H) 127(H)  BUN 6 - 20 mg/dL '10 9 7  '$ Creatinine 0.44 - 1.00 mg/dL 0.67 0.65 0.66  Sodium 135 - 145 mmol/L 140 141 144  Potassium 3.5 - 5.1 mmol/L 4.5 4.5 4.1  Chloride 101 - 111 mmol/L 94(L) 97(L) 105  CO2 22 - 32 mmol/L 39(H) 38(H) 30  Calcium 8.9 - 10.3 mg/dL 10.1 9.6 8.6  Total Protein 6.5 - 8.1 g/dL 7.0 7.1 -  Total Bilirubin 0.3 - 1.2 mg/dL 0.5 0.5 -  Alkaline Phos 38 - 126 U/L 59 57 -  AST 15 - 41 U/L 17 19 -  ALT 14 - 54 U/L 14 16 -     RADIOGRAPHIC STUDIES: I have personally reviewed the radiological images as listed and agreed with the findings in the report. Mm Diag Breast Tomo Bilateral  Result Date: 02/07/2017 CLINICAL DATA:  71 year old female with history of left breast cancer post lumpectomy in 2017. EXAM: 2D DIGITAL DIAGNOSTIC BILATERAL MAMMOGRAM WITH CAD AND ADJUNCT TOMO COMPARISON:  Previous exam(s). ACR Breast Density Category b: There are scattered areas of fibroglandular density. FINDINGS: No suspicious masses or calcifications are seen in either breast. Postsurgical changes are present in the far posterior left breast related to interval lumpectomy. Spot compression magnification MLO view of the lumpectomy site in the left breast was performed. There is no mammographic evidence of locally recurrent malignancy.  Mammographic images were processed with CAD. IMPRESSION: New lumpectomy site, left breast. No mammographic evidence of malignancy in either breast. RECOMMENDATION: Diagnostic mammogram is suggested in 1 year. (Code:[id number]-B-01Y) I have discussed the findings and recommendations with the patient. Results were also provided in writing at the conclusion of the visit. If applicable, a reminder letter will be sent to the patient regarding the next appointment. BI-RADS CATEGORY  2: Benign. Electronically Signed   By: Everlean Alstrom M.D.   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 Status post lumpectomy with postoperative breast abscess treated with antibiotics x 2. No postoperative radiation therapy due to her cardiac issues  and oxygen-dependent COPD. Plan -Patient's labs are stable. -No clinical evidence of breast cancer recurrence at this time -No mammographic evidence of breast cancer recurrence with mammogram on 02/07/2017. -Continue adjuvant endocrine treatment with Arimidex 1 mg by mouth daily. -Continue calcium and vitamin D with at least 2000 units of vitamin D daily. -Encouraged her to be physically active. -Mammogram will be due in March 2019 -Bone density scan every 2 years.  #2 oxygen dependent COPD. Elevated bicarbonate concerning for chronic CO2 retention.  #3 coronary disease status post MI   I spent 15 minutes counseling the patient face to face. The total time spent in the appointment was 20 minutes and more than 50% was on counseling and direct patient cares.    Sullivan Lone MD [place] AAHIVMS [facility] Hematology/Oncology Physician [facility]  (Office):       [phone] (Work cell):  [phone] (Fax):           [phone]

## 2017-04-05 DIAGNOSIS — J441 Chronic obstructive pulmonary disease with (acute) exacerbation: Secondary | ICD-10-CM | POA: Diagnosis not present

## 2017-04-05 DIAGNOSIS — I10 Essential (primary) hypertension: Secondary | ICD-10-CM | POA: Diagnosis not present

## 2017-04-05 DIAGNOSIS — M150 Primary generalized (osteo)arthritis: Secondary | ICD-10-CM | POA: Diagnosis not present

## 2017-05-08 DIAGNOSIS — I10 Essential (primary) hypertension: Secondary | ICD-10-CM | POA: Diagnosis not present

## 2017-05-08 DIAGNOSIS — M150 Primary generalized (osteo)arthritis: Secondary | ICD-10-CM | POA: Diagnosis not present

## 2017-05-08 DIAGNOSIS — J441 Chronic obstructive pulmonary disease with (acute) exacerbation: Secondary | ICD-10-CM | POA: Diagnosis not present

## 2017-05-15 DIAGNOSIS — F338 Other recurrent depressive disorders: Secondary | ICD-10-CM | POA: Diagnosis not present

## 2017-05-15 DIAGNOSIS — J440 Chronic obstructive pulmonary disease with acute lower respiratory infection: Secondary | ICD-10-CM | POA: Diagnosis not present

## 2017-05-15 DIAGNOSIS — E784 Other hyperlipidemia: Secondary | ICD-10-CM | POA: Diagnosis not present

## 2017-05-15 DIAGNOSIS — I10 Essential (primary) hypertension: Secondary | ICD-10-CM | POA: Diagnosis not present

## 2017-05-18 ENCOUNTER — Other Ambulatory Visit (HOSPITAL_COMMUNITY): Payer: Medicare Other

## 2017-05-18 ENCOUNTER — Ambulatory Visit (HOSPITAL_COMMUNITY): Payer: Medicare Other | Admitting: Oncology

## 2017-05-18 ENCOUNTER — Ambulatory Visit (HOSPITAL_COMMUNITY): Payer: Medicare Other

## 2017-06-20 DIAGNOSIS — D0512 Intraductal carcinoma in situ of left breast: Secondary | ICD-10-CM | POA: Diagnosis not present

## 2017-06-21 DIAGNOSIS — M150 Primary generalized (osteo)arthritis: Secondary | ICD-10-CM | POA: Diagnosis not present

## 2017-06-21 DIAGNOSIS — I10 Essential (primary) hypertension: Secondary | ICD-10-CM | POA: Diagnosis not present

## 2017-06-21 DIAGNOSIS — J441 Chronic obstructive pulmonary disease with (acute) exacerbation: Secondary | ICD-10-CM | POA: Diagnosis not present

## 2017-08-01 DIAGNOSIS — Z23 Encounter for immunization: Secondary | ICD-10-CM | POA: Diagnosis not present

## 2017-08-07 DIAGNOSIS — M150 Primary generalized (osteo)arthritis: Secondary | ICD-10-CM | POA: Diagnosis not present

## 2017-08-07 DIAGNOSIS — I10 Essential (primary) hypertension: Secondary | ICD-10-CM | POA: Diagnosis not present

## 2017-08-07 DIAGNOSIS — J441 Chronic obstructive pulmonary disease with (acute) exacerbation: Secondary | ICD-10-CM | POA: Diagnosis not present

## 2017-08-18 DIAGNOSIS — F338 Other recurrent depressive disorders: Secondary | ICD-10-CM | POA: Diagnosis not present

## 2017-08-18 DIAGNOSIS — E784 Other hyperlipidemia: Secondary | ICD-10-CM | POA: Diagnosis not present

## 2017-08-18 DIAGNOSIS — I10 Essential (primary) hypertension: Secondary | ICD-10-CM | POA: Diagnosis not present

## 2017-08-18 DIAGNOSIS — J440 Chronic obstructive pulmonary disease with acute lower respiratory infection: Secondary | ICD-10-CM | POA: Diagnosis not present

## 2017-09-26 DIAGNOSIS — I10 Essential (primary) hypertension: Secondary | ICD-10-CM | POA: Diagnosis not present

## 2017-09-26 DIAGNOSIS — E7849 Other hyperlipidemia: Secondary | ICD-10-CM | POA: Diagnosis not present

## 2017-09-26 DIAGNOSIS — J441 Chronic obstructive pulmonary disease with (acute) exacerbation: Secondary | ICD-10-CM | POA: Diagnosis not present

## 2017-11-09 DIAGNOSIS — E7849 Other hyperlipidemia: Secondary | ICD-10-CM | POA: Diagnosis not present

## 2017-11-09 DIAGNOSIS — I10 Essential (primary) hypertension: Secondary | ICD-10-CM | POA: Diagnosis not present

## 2017-11-09 DIAGNOSIS — J441 Chronic obstructive pulmonary disease with (acute) exacerbation: Secondary | ICD-10-CM | POA: Diagnosis not present

## 2017-11-16 DIAGNOSIS — Z1231 Encounter for screening mammogram for malignant neoplasm of breast: Secondary | ICD-10-CM | POA: Diagnosis not present

## 2017-11-16 DIAGNOSIS — D0512 Intraductal carcinoma in situ of left breast: Secondary | ICD-10-CM | POA: Diagnosis not present

## 2017-11-17 DIAGNOSIS — F338 Other recurrent depressive disorders: Secondary | ICD-10-CM | POA: Diagnosis not present

## 2017-11-17 DIAGNOSIS — I10 Essential (primary) hypertension: Secondary | ICD-10-CM | POA: Diagnosis not present

## 2017-11-17 DIAGNOSIS — J440 Chronic obstructive pulmonary disease with acute lower respiratory infection: Secondary | ICD-10-CM | POA: Diagnosis not present

## 2017-11-17 DIAGNOSIS — E7849 Other hyperlipidemia: Secondary | ICD-10-CM | POA: Diagnosis not present

## 2017-11-17 DIAGNOSIS — J441 Chronic obstructive pulmonary disease with (acute) exacerbation: Secondary | ICD-10-CM | POA: Diagnosis not present

## 2017-12-26 DIAGNOSIS — I10 Essential (primary) hypertension: Secondary | ICD-10-CM | POA: Diagnosis not present

## 2017-12-26 DIAGNOSIS — J441 Chronic obstructive pulmonary disease with (acute) exacerbation: Secondary | ICD-10-CM | POA: Diagnosis not present

## 2017-12-26 DIAGNOSIS — F338 Other recurrent depressive disorders: Secondary | ICD-10-CM | POA: Diagnosis not present

## 2017-12-26 DIAGNOSIS — E7849 Other hyperlipidemia: Secondary | ICD-10-CM | POA: Diagnosis not present

## 2018-02-07 DIAGNOSIS — E7849 Other hyperlipidemia: Secondary | ICD-10-CM | POA: Diagnosis not present

## 2018-02-07 DIAGNOSIS — J441 Chronic obstructive pulmonary disease with (acute) exacerbation: Secondary | ICD-10-CM | POA: Diagnosis not present

## 2018-02-07 DIAGNOSIS — I10 Essential (primary) hypertension: Secondary | ICD-10-CM | POA: Diagnosis not present

## 2018-02-07 DIAGNOSIS — F338 Other recurrent depressive disorders: Secondary | ICD-10-CM | POA: Diagnosis not present

## 2018-02-22 DIAGNOSIS — F338 Other recurrent depressive disorders: Secondary | ICD-10-CM | POA: Diagnosis not present

## 2018-02-22 DIAGNOSIS — E7849 Other hyperlipidemia: Secondary | ICD-10-CM | POA: Diagnosis not present

## 2018-02-22 DIAGNOSIS — I10 Essential (primary) hypertension: Secondary | ICD-10-CM | POA: Diagnosis not present

## 2018-02-22 DIAGNOSIS — J441 Chronic obstructive pulmonary disease with (acute) exacerbation: Secondary | ICD-10-CM | POA: Diagnosis not present

## 2018-02-27 DIAGNOSIS — Z1231 Encounter for screening mammogram for malignant neoplasm of breast: Secondary | ICD-10-CM | POA: Diagnosis not present

## 2018-05-17 DIAGNOSIS — M8580 Other specified disorders of bone density and structure, unspecified site: Secondary | ICD-10-CM | POA: Diagnosis not present

## 2018-05-17 DIAGNOSIS — D0512 Intraductal carcinoma in situ of left breast: Secondary | ICD-10-CM | POA: Diagnosis not present

## 2018-05-21 DIAGNOSIS — E7849 Other hyperlipidemia: Secondary | ICD-10-CM | POA: Diagnosis not present

## 2018-05-21 DIAGNOSIS — I10 Essential (primary) hypertension: Secondary | ICD-10-CM | POA: Diagnosis not present

## 2018-05-21 DIAGNOSIS — J441 Chronic obstructive pulmonary disease with (acute) exacerbation: Secondary | ICD-10-CM | POA: Diagnosis not present

## 2018-05-24 DIAGNOSIS — Z0000 Encounter for general adult medical examination without abnormal findings: Secondary | ICD-10-CM | POA: Diagnosis not present

## 2018-05-24 DIAGNOSIS — I10 Essential (primary) hypertension: Secondary | ICD-10-CM | POA: Diagnosis not present

## 2018-05-24 DIAGNOSIS — J441 Chronic obstructive pulmonary disease with (acute) exacerbation: Secondary | ICD-10-CM | POA: Diagnosis not present

## 2018-05-24 DIAGNOSIS — Z1389 Encounter for screening for other disorder: Secondary | ICD-10-CM | POA: Diagnosis not present

## 2018-05-24 DIAGNOSIS — F338 Other recurrent depressive disorders: Secondary | ICD-10-CM | POA: Diagnosis not present

## 2018-05-24 DIAGNOSIS — E7849 Other hyperlipidemia: Secondary | ICD-10-CM | POA: Diagnosis not present

## 2018-06-18 DIAGNOSIS — J441 Chronic obstructive pulmonary disease with (acute) exacerbation: Secondary | ICD-10-CM | POA: Diagnosis not present

## 2018-06-18 DIAGNOSIS — F338 Other recurrent depressive disorders: Secondary | ICD-10-CM | POA: Diagnosis not present

## 2018-06-18 DIAGNOSIS — E7849 Other hyperlipidemia: Secondary | ICD-10-CM | POA: Diagnosis not present

## 2018-06-18 DIAGNOSIS — I10 Essential (primary) hypertension: Secondary | ICD-10-CM | POA: Diagnosis not present

## 2018-07-20 DIAGNOSIS — J441 Chronic obstructive pulmonary disease with (acute) exacerbation: Secondary | ICD-10-CM | POA: Diagnosis not present

## 2018-07-20 DIAGNOSIS — E7849 Other hyperlipidemia: Secondary | ICD-10-CM | POA: Diagnosis not present

## 2018-07-20 DIAGNOSIS — I10 Essential (primary) hypertension: Secondary | ICD-10-CM | POA: Diagnosis not present

## 2018-07-20 DIAGNOSIS — F338 Other recurrent depressive disorders: Secondary | ICD-10-CM | POA: Diagnosis not present

## 2018-07-29 DIAGNOSIS — I10 Essential (primary) hypertension: Secondary | ICD-10-CM | POA: Diagnosis not present

## 2018-08-23 DIAGNOSIS — I10 Essential (primary) hypertension: Secondary | ICD-10-CM | POA: Diagnosis not present

## 2018-08-23 DIAGNOSIS — F338 Other recurrent depressive disorders: Secondary | ICD-10-CM | POA: Diagnosis not present

## 2018-08-23 DIAGNOSIS — Z6828 Body mass index (BMI) 28.0-28.9, adult: Secondary | ICD-10-CM | POA: Diagnosis not present

## 2018-08-23 DIAGNOSIS — E7849 Other hyperlipidemia: Secondary | ICD-10-CM | POA: Diagnosis not present

## 2018-08-23 DIAGNOSIS — J449 Chronic obstructive pulmonary disease, unspecified: Secondary | ICD-10-CM | POA: Diagnosis not present

## 2018-09-17 DIAGNOSIS — E7849 Other hyperlipidemia: Secondary | ICD-10-CM | POA: Diagnosis not present

## 2018-09-17 DIAGNOSIS — I10 Essential (primary) hypertension: Secondary | ICD-10-CM | POA: Diagnosis not present

## 2018-09-17 DIAGNOSIS — F338 Other recurrent depressive disorders: Secondary | ICD-10-CM | POA: Diagnosis not present

## 2018-09-17 DIAGNOSIS — J449 Chronic obstructive pulmonary disease, unspecified: Secondary | ICD-10-CM | POA: Diagnosis not present

## 2018-09-20 DIAGNOSIS — Z23 Encounter for immunization: Secondary | ICD-10-CM | POA: Diagnosis not present

## 2018-10-08 DIAGNOSIS — M810 Age-related osteoporosis without current pathological fracture: Secondary | ICD-10-CM | POA: Diagnosis not present

## 2018-10-16 DIAGNOSIS — I10 Essential (primary) hypertension: Secondary | ICD-10-CM | POA: Diagnosis not present

## 2018-10-16 DIAGNOSIS — E7849 Other hyperlipidemia: Secondary | ICD-10-CM | POA: Diagnosis not present

## 2018-10-16 DIAGNOSIS — F338 Other recurrent depressive disorders: Secondary | ICD-10-CM | POA: Diagnosis not present

## 2018-10-16 DIAGNOSIS — J449 Chronic obstructive pulmonary disease, unspecified: Secondary | ICD-10-CM | POA: Diagnosis not present

## 2018-11-08 DIAGNOSIS — I10 Essential (primary) hypertension: Secondary | ICD-10-CM | POA: Diagnosis not present

## 2018-11-08 DIAGNOSIS — E7849 Other hyperlipidemia: Secondary | ICD-10-CM | POA: Diagnosis not present

## 2018-11-08 DIAGNOSIS — J449 Chronic obstructive pulmonary disease, unspecified: Secondary | ICD-10-CM | POA: Diagnosis not present

## 2018-11-08 DIAGNOSIS — F338 Other recurrent depressive disorders: Secondary | ICD-10-CM | POA: Diagnosis not present

## 2018-11-23 DIAGNOSIS — I10 Essential (primary) hypertension: Secondary | ICD-10-CM | POA: Diagnosis not present

## 2018-11-23 DIAGNOSIS — E7849 Other hyperlipidemia: Secondary | ICD-10-CM | POA: Diagnosis not present

## 2018-11-23 DIAGNOSIS — F338 Other recurrent depressive disorders: Secondary | ICD-10-CM | POA: Diagnosis not present

## 2018-11-23 DIAGNOSIS — J449 Chronic obstructive pulmonary disease, unspecified: Secondary | ICD-10-CM | POA: Diagnosis not present

## 2018-12-07 DIAGNOSIS — J449 Chronic obstructive pulmonary disease, unspecified: Secondary | ICD-10-CM | POA: Diagnosis not present

## 2018-12-07 DIAGNOSIS — F338 Other recurrent depressive disorders: Secondary | ICD-10-CM | POA: Diagnosis not present

## 2018-12-07 DIAGNOSIS — I10 Essential (primary) hypertension: Secondary | ICD-10-CM | POA: Diagnosis not present

## 2018-12-07 DIAGNOSIS — E7849 Other hyperlipidemia: Secondary | ICD-10-CM | POA: Diagnosis not present

## 2018-12-18 DIAGNOSIS — Z1231 Encounter for screening mammogram for malignant neoplasm of breast: Secondary | ICD-10-CM | POA: Diagnosis not present

## 2018-12-18 DIAGNOSIS — M8580 Other specified disorders of bone density and structure, unspecified site: Secondary | ICD-10-CM | POA: Diagnosis not present

## 2019-01-14 DIAGNOSIS — F338 Other recurrent depressive disorders: Secondary | ICD-10-CM | POA: Diagnosis not present

## 2019-01-14 DIAGNOSIS — E7849 Other hyperlipidemia: Secondary | ICD-10-CM | POA: Diagnosis not present

## 2019-01-14 DIAGNOSIS — J449 Chronic obstructive pulmonary disease, unspecified: Secondary | ICD-10-CM | POA: Diagnosis not present

## 2019-01-14 DIAGNOSIS — I10 Essential (primary) hypertension: Secondary | ICD-10-CM | POA: Diagnosis not present

## 2019-02-25 DIAGNOSIS — F338 Other recurrent depressive disorders: Secondary | ICD-10-CM | POA: Diagnosis not present

## 2019-02-25 DIAGNOSIS — E7849 Other hyperlipidemia: Secondary | ICD-10-CM | POA: Diagnosis not present

## 2019-02-25 DIAGNOSIS — I10 Essential (primary) hypertension: Secondary | ICD-10-CM | POA: Diagnosis not present

## 2019-02-25 DIAGNOSIS — J449 Chronic obstructive pulmonary disease, unspecified: Secondary | ICD-10-CM | POA: Diagnosis not present

## 2019-04-08 DIAGNOSIS — E7849 Other hyperlipidemia: Secondary | ICD-10-CM | POA: Diagnosis not present

## 2019-04-08 DIAGNOSIS — J449 Chronic obstructive pulmonary disease, unspecified: Secondary | ICD-10-CM | POA: Diagnosis not present

## 2019-04-08 DIAGNOSIS — I10 Essential (primary) hypertension: Secondary | ICD-10-CM | POA: Diagnosis not present

## 2019-04-08 DIAGNOSIS — F338 Other recurrent depressive disorders: Secondary | ICD-10-CM | POA: Diagnosis not present

## 2019-05-06 DIAGNOSIS — I10 Essential (primary) hypertension: Secondary | ICD-10-CM | POA: Diagnosis not present

## 2019-05-06 DIAGNOSIS — E7849 Other hyperlipidemia: Secondary | ICD-10-CM | POA: Diagnosis not present

## 2019-05-06 DIAGNOSIS — J449 Chronic obstructive pulmonary disease, unspecified: Secondary | ICD-10-CM | POA: Diagnosis not present

## 2019-05-06 DIAGNOSIS — F338 Other recurrent depressive disorders: Secondary | ICD-10-CM | POA: Diagnosis not present

## 2019-05-14 DIAGNOSIS — Z1231 Encounter for screening mammogram for malignant neoplasm of breast: Secondary | ICD-10-CM | POA: Diagnosis not present

## 2019-05-28 DIAGNOSIS — I878 Other specified disorders of veins: Secondary | ICD-10-CM | POA: Diagnosis not present

## 2019-05-28 DIAGNOSIS — I10 Essential (primary) hypertension: Secondary | ICD-10-CM | POA: Diagnosis not present

## 2019-05-28 DIAGNOSIS — E7849 Other hyperlipidemia: Secondary | ICD-10-CM | POA: Diagnosis not present

## 2019-05-28 DIAGNOSIS — J449 Chronic obstructive pulmonary disease, unspecified: Secondary | ICD-10-CM | POA: Diagnosis not present

## 2019-05-28 DIAGNOSIS — F338 Other recurrent depressive disorders: Secondary | ICD-10-CM | POA: Diagnosis not present

## 2019-05-29 DIAGNOSIS — R920 Mammographic microcalcification found on diagnostic imaging of breast: Secondary | ICD-10-CM | POA: Diagnosis not present

## 2019-05-29 DIAGNOSIS — N6489 Other specified disorders of breast: Secondary | ICD-10-CM | POA: Diagnosis not present

## 2019-05-29 DIAGNOSIS — N6323 Unspecified lump in the left breast, lower outer quadrant: Secondary | ICD-10-CM | POA: Diagnosis not present

## 2019-05-29 DIAGNOSIS — R922 Inconclusive mammogram: Secondary | ICD-10-CM | POA: Diagnosis not present

## 2019-05-29 DIAGNOSIS — N6325 Unspecified lump in the left breast, overlapping quadrants: Secondary | ICD-10-CM | POA: Diagnosis not present

## 2019-05-29 DIAGNOSIS — N6324 Unspecified lump in the left breast, lower inner quadrant: Secondary | ICD-10-CM | POA: Diagnosis not present

## 2019-05-29 DIAGNOSIS — Z853 Personal history of malignant neoplasm of breast: Secondary | ICD-10-CM | POA: Diagnosis not present

## 2019-06-06 DIAGNOSIS — I10 Essential (primary) hypertension: Secondary | ICD-10-CM | POA: Diagnosis not present

## 2019-06-06 DIAGNOSIS — F338 Other recurrent depressive disorders: Secondary | ICD-10-CM | POA: Diagnosis not present

## 2019-06-06 DIAGNOSIS — E7849 Other hyperlipidemia: Secondary | ICD-10-CM | POA: Diagnosis not present

## 2019-06-06 DIAGNOSIS — J449 Chronic obstructive pulmonary disease, unspecified: Secondary | ICD-10-CM | POA: Diagnosis not present

## 2019-06-19 DIAGNOSIS — D0512 Intraductal carcinoma in situ of left breast: Secondary | ICD-10-CM | POA: Diagnosis not present

## 2019-07-01 ENCOUNTER — Other Ambulatory Visit: Payer: Self-pay

## 2019-07-02 DIAGNOSIS — N63 Unspecified lump in unspecified breast: Secondary | ICD-10-CM | POA: Diagnosis not present

## 2019-07-02 DIAGNOSIS — D0512 Intraductal carcinoma in situ of left breast: Secondary | ICD-10-CM | POA: Diagnosis not present

## 2019-07-10 DIAGNOSIS — F338 Other recurrent depressive disorders: Secondary | ICD-10-CM | POA: Diagnosis not present

## 2019-07-10 DIAGNOSIS — E7849 Other hyperlipidemia: Secondary | ICD-10-CM | POA: Diagnosis not present

## 2019-07-10 DIAGNOSIS — I10 Essential (primary) hypertension: Secondary | ICD-10-CM | POA: Diagnosis not present

## 2019-07-10 DIAGNOSIS — J449 Chronic obstructive pulmonary disease, unspecified: Secondary | ICD-10-CM | POA: Diagnosis not present

## 2019-08-01 DIAGNOSIS — M8580 Other specified disorders of bone density and structure, unspecified site: Secondary | ICD-10-CM | POA: Diagnosis not present

## 2019-08-01 DIAGNOSIS — N63 Unspecified lump in unspecified breast: Secondary | ICD-10-CM | POA: Diagnosis not present

## 2019-08-01 DIAGNOSIS — D0512 Intraductal carcinoma in situ of left breast: Secondary | ICD-10-CM | POA: Diagnosis not present

## 2019-08-12 DIAGNOSIS — J449 Chronic obstructive pulmonary disease, unspecified: Secondary | ICD-10-CM | POA: Diagnosis not present

## 2019-08-12 DIAGNOSIS — E7849 Other hyperlipidemia: Secondary | ICD-10-CM | POA: Diagnosis not present

## 2019-08-12 DIAGNOSIS — I10 Essential (primary) hypertension: Secondary | ICD-10-CM | POA: Diagnosis not present

## 2019-08-12 DIAGNOSIS — F338 Other recurrent depressive disorders: Secondary | ICD-10-CM | POA: Diagnosis not present

## 2019-08-26 DIAGNOSIS — E782 Mixed hyperlipidemia: Secondary | ICD-10-CM | POA: Diagnosis not present

## 2019-08-26 DIAGNOSIS — F338 Other recurrent depressive disorders: Secondary | ICD-10-CM | POA: Diagnosis not present

## 2019-08-26 DIAGNOSIS — Z0000 Encounter for general adult medical examination without abnormal findings: Secondary | ICD-10-CM | POA: Diagnosis not present

## 2019-08-26 DIAGNOSIS — J449 Chronic obstructive pulmonary disease, unspecified: Secondary | ICD-10-CM | POA: Diagnosis not present

## 2019-08-26 DIAGNOSIS — Z1389 Encounter for screening for other disorder: Secondary | ICD-10-CM | POA: Diagnosis not present

## 2019-08-26 DIAGNOSIS — I10 Essential (primary) hypertension: Secondary | ICD-10-CM | POA: Diagnosis not present

## 2019-08-26 DIAGNOSIS — I878 Other specified disorders of veins: Secondary | ICD-10-CM | POA: Diagnosis not present

## 2019-09-04 DIAGNOSIS — N6325 Unspecified lump in the left breast, overlapping quadrants: Secondary | ICD-10-CM | POA: Diagnosis not present

## 2019-09-04 DIAGNOSIS — N6032 Fibrosclerosis of left breast: Secondary | ICD-10-CM | POA: Diagnosis not present

## 2019-09-04 DIAGNOSIS — N6324 Unspecified lump in the left breast, lower inner quadrant: Secondary | ICD-10-CM | POA: Diagnosis not present

## 2019-09-06 DIAGNOSIS — E782 Mixed hyperlipidemia: Secondary | ICD-10-CM | POA: Diagnosis not present

## 2019-09-06 DIAGNOSIS — J449 Chronic obstructive pulmonary disease, unspecified: Secondary | ICD-10-CM | POA: Diagnosis not present

## 2019-09-06 DIAGNOSIS — F338 Other recurrent depressive disorders: Secondary | ICD-10-CM | POA: Diagnosis not present

## 2019-09-06 DIAGNOSIS — I10 Essential (primary) hypertension: Secondary | ICD-10-CM | POA: Diagnosis not present

## 2019-09-09 DIAGNOSIS — J431 Panlobular emphysema: Secondary | ICD-10-CM | POA: Diagnosis not present

## 2019-09-09 DIAGNOSIS — E559 Vitamin D deficiency, unspecified: Secondary | ICD-10-CM | POA: Diagnosis not present

## 2019-09-09 DIAGNOSIS — D0512 Intraductal carcinoma in situ of left breast: Secondary | ICD-10-CM | POA: Diagnosis not present

## 2019-09-09 DIAGNOSIS — Z23 Encounter for immunization: Secondary | ICD-10-CM | POA: Diagnosis not present

## 2019-09-09 DIAGNOSIS — M8580 Other specified disorders of bone density and structure, unspecified site: Secondary | ICD-10-CM | POA: Diagnosis not present

## 2019-09-09 DIAGNOSIS — N63 Unspecified lump in unspecified breast: Secondary | ICD-10-CM | POA: Diagnosis not present

## 2019-09-09 DIAGNOSIS — M810 Age-related osteoporosis without current pathological fracture: Secondary | ICD-10-CM | POA: Diagnosis not present

## 2019-10-09 DIAGNOSIS — I10 Essential (primary) hypertension: Secondary | ICD-10-CM | POA: Diagnosis not present

## 2019-10-09 DIAGNOSIS — J449 Chronic obstructive pulmonary disease, unspecified: Secondary | ICD-10-CM | POA: Diagnosis not present

## 2019-10-09 DIAGNOSIS — E782 Mixed hyperlipidemia: Secondary | ICD-10-CM | POA: Diagnosis not present

## 2019-10-09 DIAGNOSIS — F338 Other recurrent depressive disorders: Secondary | ICD-10-CM | POA: Diagnosis not present

## 2019-11-08 DIAGNOSIS — F338 Other recurrent depressive disorders: Secondary | ICD-10-CM | POA: Diagnosis not present

## 2019-11-08 DIAGNOSIS — I10 Essential (primary) hypertension: Secondary | ICD-10-CM | POA: Diagnosis not present

## 2019-11-08 DIAGNOSIS — E782 Mixed hyperlipidemia: Secondary | ICD-10-CM | POA: Diagnosis not present

## 2019-11-08 DIAGNOSIS — J449 Chronic obstructive pulmonary disease, unspecified: Secondary | ICD-10-CM | POA: Diagnosis not present

## 2019-12-11 DIAGNOSIS — J449 Chronic obstructive pulmonary disease, unspecified: Secondary | ICD-10-CM | POA: Diagnosis not present

## 2019-12-11 DIAGNOSIS — I10 Essential (primary) hypertension: Secondary | ICD-10-CM | POA: Diagnosis not present

## 2019-12-11 DIAGNOSIS — E782 Mixed hyperlipidemia: Secondary | ICD-10-CM | POA: Diagnosis not present

## 2020-01-09 DIAGNOSIS — E782 Mixed hyperlipidemia: Secondary | ICD-10-CM | POA: Diagnosis not present

## 2020-01-09 DIAGNOSIS — I10 Essential (primary) hypertension: Secondary | ICD-10-CM | POA: Diagnosis not present

## 2020-01-09 DIAGNOSIS — J449 Chronic obstructive pulmonary disease, unspecified: Secondary | ICD-10-CM | POA: Diagnosis not present

## 2020-02-10 DIAGNOSIS — I10 Essential (primary) hypertension: Secondary | ICD-10-CM | POA: Diagnosis not present

## 2020-02-10 DIAGNOSIS — J449 Chronic obstructive pulmonary disease, unspecified: Secondary | ICD-10-CM | POA: Diagnosis not present

## 2020-02-10 DIAGNOSIS — E782 Mixed hyperlipidemia: Secondary | ICD-10-CM | POA: Diagnosis not present

## 2020-02-19 DIAGNOSIS — Z23 Encounter for immunization: Secondary | ICD-10-CM | POA: Diagnosis not present

## 2020-02-24 DIAGNOSIS — F338 Other recurrent depressive disorders: Secondary | ICD-10-CM | POA: Diagnosis not present

## 2020-02-24 DIAGNOSIS — I10 Essential (primary) hypertension: Secondary | ICD-10-CM | POA: Diagnosis not present

## 2020-02-24 DIAGNOSIS — E782 Mixed hyperlipidemia: Secondary | ICD-10-CM | POA: Diagnosis not present

## 2020-02-24 DIAGNOSIS — I878 Other specified disorders of veins: Secondary | ICD-10-CM | POA: Diagnosis not present

## 2020-02-24 DIAGNOSIS — J449 Chronic obstructive pulmonary disease, unspecified: Secondary | ICD-10-CM | POA: Diagnosis not present

## 2020-03-02 DIAGNOSIS — M8580 Other specified disorders of bone density and structure, unspecified site: Secondary | ICD-10-CM | POA: Diagnosis not present

## 2020-03-02 DIAGNOSIS — D0512 Intraductal carcinoma in situ of left breast: Secondary | ICD-10-CM | POA: Diagnosis not present

## 2020-03-02 DIAGNOSIS — E559 Vitamin D deficiency, unspecified: Secondary | ICD-10-CM | POA: Diagnosis not present

## 2020-03-09 DIAGNOSIS — D0512 Intraductal carcinoma in situ of left breast: Secondary | ICD-10-CM | POA: Diagnosis not present

## 2020-03-09 DIAGNOSIS — Z79811 Long term (current) use of aromatase inhibitors: Secondary | ICD-10-CM | POA: Diagnosis not present

## 2020-03-09 DIAGNOSIS — N63 Unspecified lump in unspecified breast: Secondary | ICD-10-CM | POA: Diagnosis not present

## 2020-03-09 DIAGNOSIS — M8580 Other specified disorders of bone density and structure, unspecified site: Secondary | ICD-10-CM | POA: Diagnosis not present

## 2020-03-11 DIAGNOSIS — J449 Chronic obstructive pulmonary disease, unspecified: Secondary | ICD-10-CM | POA: Diagnosis not present

## 2020-03-11 DIAGNOSIS — F338 Other recurrent depressive disorders: Secondary | ICD-10-CM | POA: Diagnosis not present

## 2020-03-11 DIAGNOSIS — E782 Mixed hyperlipidemia: Secondary | ICD-10-CM | POA: Diagnosis not present

## 2020-03-11 DIAGNOSIS — I10 Essential (primary) hypertension: Secondary | ICD-10-CM | POA: Diagnosis not present

## 2020-03-18 DIAGNOSIS — R928 Other abnormal and inconclusive findings on diagnostic imaging of breast: Secondary | ICD-10-CM | POA: Diagnosis not present

## 2020-03-18 DIAGNOSIS — Z853 Personal history of malignant neoplasm of breast: Secondary | ICD-10-CM | POA: Diagnosis not present

## 2020-03-18 DIAGNOSIS — Z23 Encounter for immunization: Secondary | ICD-10-CM | POA: Diagnosis not present

## 2020-03-18 DIAGNOSIS — D0512 Intraductal carcinoma in situ of left breast: Secondary | ICD-10-CM | POA: Diagnosis not present

## 2020-04-07 DIAGNOSIS — J449 Chronic obstructive pulmonary disease, unspecified: Secondary | ICD-10-CM | POA: Diagnosis not present

## 2020-04-07 DIAGNOSIS — F338 Other recurrent depressive disorders: Secondary | ICD-10-CM | POA: Diagnosis not present

## 2020-04-07 DIAGNOSIS — E782 Mixed hyperlipidemia: Secondary | ICD-10-CM | POA: Diagnosis not present

## 2020-04-07 DIAGNOSIS — I10 Essential (primary) hypertension: Secondary | ICD-10-CM | POA: Diagnosis not present

## 2020-06-15 DIAGNOSIS — I10 Essential (primary) hypertension: Secondary | ICD-10-CM | POA: Diagnosis not present

## 2020-06-15 DIAGNOSIS — E782 Mixed hyperlipidemia: Secondary | ICD-10-CM | POA: Diagnosis not present

## 2020-06-15 DIAGNOSIS — J449 Chronic obstructive pulmonary disease, unspecified: Secondary | ICD-10-CM | POA: Diagnosis not present

## 2020-06-15 DIAGNOSIS — F338 Other recurrent depressive disorders: Secondary | ICD-10-CM | POA: Diagnosis not present

## 2020-07-13 DIAGNOSIS — E782 Mixed hyperlipidemia: Secondary | ICD-10-CM | POA: Diagnosis not present

## 2020-07-13 DIAGNOSIS — I10 Essential (primary) hypertension: Secondary | ICD-10-CM | POA: Diagnosis not present

## 2020-07-13 DIAGNOSIS — F338 Other recurrent depressive disorders: Secondary | ICD-10-CM | POA: Diagnosis not present

## 2020-07-13 DIAGNOSIS — J449 Chronic obstructive pulmonary disease, unspecified: Secondary | ICD-10-CM | POA: Diagnosis not present

## 2020-07-22 DIAGNOSIS — Z9889 Other specified postprocedural states: Secondary | ICD-10-CM | POA: Diagnosis not present

## 2020-07-22 DIAGNOSIS — R928 Other abnormal and inconclusive findings on diagnostic imaging of breast: Secondary | ICD-10-CM | POA: Diagnosis not present

## 2020-07-22 DIAGNOSIS — D0512 Intraductal carcinoma in situ of left breast: Secondary | ICD-10-CM | POA: Diagnosis not present

## 2020-08-18 DIAGNOSIS — E782 Mixed hyperlipidemia: Secondary | ICD-10-CM | POA: Diagnosis not present

## 2020-08-18 DIAGNOSIS — J449 Chronic obstructive pulmonary disease, unspecified: Secondary | ICD-10-CM | POA: Diagnosis not present

## 2020-08-18 DIAGNOSIS — I10 Essential (primary) hypertension: Secondary | ICD-10-CM | POA: Diagnosis not present

## 2020-08-18 DIAGNOSIS — F338 Other recurrent depressive disorders: Secondary | ICD-10-CM | POA: Diagnosis not present

## 2020-08-25 DIAGNOSIS — J449 Chronic obstructive pulmonary disease, unspecified: Secondary | ICD-10-CM | POA: Diagnosis not present

## 2020-08-25 DIAGNOSIS — E782 Mixed hyperlipidemia: Secondary | ICD-10-CM | POA: Diagnosis not present

## 2020-08-25 DIAGNOSIS — I878 Other specified disorders of veins: Secondary | ICD-10-CM | POA: Diagnosis not present

## 2020-08-25 DIAGNOSIS — I10 Essential (primary) hypertension: Secondary | ICD-10-CM | POA: Diagnosis not present

## 2020-08-25 DIAGNOSIS — F338 Other recurrent depressive disorders: Secondary | ICD-10-CM | POA: Diagnosis not present

## 2020-09-01 DIAGNOSIS — M8580 Other specified disorders of bone density and structure, unspecified site: Secondary | ICD-10-CM | POA: Diagnosis not present

## 2020-09-01 DIAGNOSIS — I252 Old myocardial infarction: Secondary | ICD-10-CM | POA: Diagnosis not present

## 2020-09-01 DIAGNOSIS — D0512 Intraductal carcinoma in situ of left breast: Secondary | ICD-10-CM | POA: Diagnosis not present

## 2020-09-01 DIAGNOSIS — J431 Panlobular emphysema: Secondary | ICD-10-CM | POA: Diagnosis not present

## 2020-09-10 DIAGNOSIS — J431 Panlobular emphysema: Secondary | ICD-10-CM | POA: Diagnosis not present

## 2020-09-10 DIAGNOSIS — I10 Essential (primary) hypertension: Secondary | ICD-10-CM | POA: Diagnosis not present

## 2020-09-10 DIAGNOSIS — E785 Hyperlipidemia, unspecified: Secondary | ICD-10-CM | POA: Diagnosis not present

## 2020-09-10 DIAGNOSIS — Z9229 Personal history of other drug therapy: Secondary | ICD-10-CM | POA: Diagnosis not present

## 2020-09-10 DIAGNOSIS — M8580 Other specified disorders of bone density and structure, unspecified site: Secondary | ICD-10-CM | POA: Diagnosis not present

## 2020-09-10 DIAGNOSIS — Z23 Encounter for immunization: Secondary | ICD-10-CM | POA: Diagnosis not present

## 2020-09-10 DIAGNOSIS — N63 Unspecified lump in unspecified breast: Secondary | ICD-10-CM | POA: Diagnosis not present

## 2020-09-10 DIAGNOSIS — J449 Chronic obstructive pulmonary disease, unspecified: Secondary | ICD-10-CM | POA: Diagnosis not present

## 2020-09-10 DIAGNOSIS — D0512 Intraductal carcinoma in situ of left breast: Secondary | ICD-10-CM | POA: Diagnosis not present

## 2020-09-10 DIAGNOSIS — Z79811 Long term (current) use of aromatase inhibitors: Secondary | ICD-10-CM | POA: Diagnosis not present

## 2020-09-10 DIAGNOSIS — I2510 Atherosclerotic heart disease of native coronary artery without angina pectoris: Secondary | ICD-10-CM | POA: Diagnosis not present

## 2020-09-11 DIAGNOSIS — F338 Other recurrent depressive disorders: Secondary | ICD-10-CM | POA: Diagnosis not present

## 2020-09-11 DIAGNOSIS — E782 Mixed hyperlipidemia: Secondary | ICD-10-CM | POA: Diagnosis not present

## 2020-09-11 DIAGNOSIS — J449 Chronic obstructive pulmonary disease, unspecified: Secondary | ICD-10-CM | POA: Diagnosis not present

## 2020-09-11 DIAGNOSIS — I10 Essential (primary) hypertension: Secondary | ICD-10-CM | POA: Diagnosis not present

## 2020-10-09 DIAGNOSIS — E782 Mixed hyperlipidemia: Secondary | ICD-10-CM | POA: Diagnosis not present

## 2020-10-09 DIAGNOSIS — J449 Chronic obstructive pulmonary disease, unspecified: Secondary | ICD-10-CM | POA: Diagnosis not present

## 2020-10-09 DIAGNOSIS — F338 Other recurrent depressive disorders: Secondary | ICD-10-CM | POA: Diagnosis not present

## 2020-10-09 DIAGNOSIS — I10 Essential (primary) hypertension: Secondary | ICD-10-CM | POA: Diagnosis not present

## 2020-11-05 DIAGNOSIS — I10 Essential (primary) hypertension: Secondary | ICD-10-CM | POA: Diagnosis not present

## 2020-11-05 DIAGNOSIS — E782 Mixed hyperlipidemia: Secondary | ICD-10-CM | POA: Diagnosis not present

## 2020-11-05 DIAGNOSIS — J449 Chronic obstructive pulmonary disease, unspecified: Secondary | ICD-10-CM | POA: Diagnosis not present

## 2020-11-05 DIAGNOSIS — F338 Other recurrent depressive disorders: Secondary | ICD-10-CM | POA: Diagnosis not present

## 2020-11-10 DIAGNOSIS — Z23 Encounter for immunization: Secondary | ICD-10-CM | POA: Diagnosis not present

## 2020-11-24 DIAGNOSIS — F338 Other recurrent depressive disorders: Secondary | ICD-10-CM | POA: Diagnosis not present

## 2020-11-24 DIAGNOSIS — E782 Mixed hyperlipidemia: Secondary | ICD-10-CM | POA: Diagnosis not present

## 2020-11-24 DIAGNOSIS — I878 Other specified disorders of veins: Secondary | ICD-10-CM | POA: Diagnosis not present

## 2020-11-24 DIAGNOSIS — J449 Chronic obstructive pulmonary disease, unspecified: Secondary | ICD-10-CM | POA: Diagnosis not present

## 2020-11-24 DIAGNOSIS — I10 Essential (primary) hypertension: Secondary | ICD-10-CM | POA: Diagnosis not present

## 2021-02-22 DIAGNOSIS — F331 Major depressive disorder, recurrent, moderate: Secondary | ICD-10-CM | POA: Diagnosis not present

## 2021-02-22 DIAGNOSIS — E782 Mixed hyperlipidemia: Secondary | ICD-10-CM | POA: Diagnosis not present

## 2021-02-22 DIAGNOSIS — I878 Other specified disorders of veins: Secondary | ICD-10-CM | POA: Diagnosis not present

## 2021-02-22 DIAGNOSIS — I10 Essential (primary) hypertension: Secondary | ICD-10-CM | POA: Diagnosis not present

## 2021-02-22 DIAGNOSIS — J449 Chronic obstructive pulmonary disease, unspecified: Secondary | ICD-10-CM | POA: Diagnosis not present

## 2021-03-08 DIAGNOSIS — M8580 Other specified disorders of bone density and structure, unspecified site: Secondary | ICD-10-CM | POA: Diagnosis not present

## 2021-03-08 DIAGNOSIS — D0512 Intraductal carcinoma in situ of left breast: Secondary | ICD-10-CM | POA: Diagnosis not present

## 2021-04-01 DIAGNOSIS — M8580 Other specified disorders of bone density and structure, unspecified site: Secondary | ICD-10-CM | POA: Diagnosis not present

## 2021-04-01 DIAGNOSIS — N63 Unspecified lump in unspecified breast: Secondary | ICD-10-CM | POA: Diagnosis not present

## 2021-04-01 DIAGNOSIS — Z1231 Encounter for screening mammogram for malignant neoplasm of breast: Secondary | ICD-10-CM | POA: Diagnosis not present

## 2021-04-01 DIAGNOSIS — Z79811 Long term (current) use of aromatase inhibitors: Secondary | ICD-10-CM | POA: Diagnosis not present

## 2021-04-01 DIAGNOSIS — D0512 Intraductal carcinoma in situ of left breast: Secondary | ICD-10-CM | POA: Diagnosis not present

## 2021-05-11 DIAGNOSIS — Z1379 Encounter for other screening for genetic and chromosomal anomalies: Secondary | ICD-10-CM | POA: Diagnosis not present

## 2021-05-11 DIAGNOSIS — Z803 Family history of malignant neoplasm of breast: Secondary | ICD-10-CM | POA: Diagnosis not present

## 2021-05-11 DIAGNOSIS — Z853 Personal history of malignant neoplasm of breast: Secondary | ICD-10-CM | POA: Diagnosis not present

## 2021-05-11 DIAGNOSIS — C50919 Malignant neoplasm of unspecified site of unspecified female breast: Secondary | ICD-10-CM | POA: Diagnosis not present

## 2021-05-17 DIAGNOSIS — I878 Other specified disorders of veins: Secondary | ICD-10-CM | POA: Diagnosis not present

## 2021-05-17 DIAGNOSIS — J449 Chronic obstructive pulmonary disease, unspecified: Secondary | ICD-10-CM | POA: Diagnosis not present

## 2021-05-17 DIAGNOSIS — E782 Mixed hyperlipidemia: Secondary | ICD-10-CM | POA: Diagnosis not present

## 2021-05-17 DIAGNOSIS — J440 Chronic obstructive pulmonary disease with acute lower respiratory infection: Secondary | ICD-10-CM | POA: Diagnosis not present

## 2021-05-17 DIAGNOSIS — N3091 Cystitis, unspecified with hematuria: Secondary | ICD-10-CM | POA: Diagnosis not present

## 2021-05-17 DIAGNOSIS — F331 Major depressive disorder, recurrent, moderate: Secondary | ICD-10-CM | POA: Diagnosis not present

## 2021-05-17 DIAGNOSIS — I10 Essential (primary) hypertension: Secondary | ICD-10-CM | POA: Diagnosis not present

## 2021-05-17 DIAGNOSIS — R4182 Altered mental status, unspecified: Secondary | ICD-10-CM | POA: Diagnosis not present

## 2021-05-25 ENCOUNTER — Encounter (HOSPITAL_COMMUNITY): Payer: Self-pay

## 2021-05-25 ENCOUNTER — Observation Stay (HOSPITAL_COMMUNITY): Payer: Medicare Other

## 2021-05-25 ENCOUNTER — Emergency Department (HOSPITAL_COMMUNITY): Payer: Medicare Other

## 2021-05-25 ENCOUNTER — Inpatient Hospital Stay (HOSPITAL_COMMUNITY)
Admission: EM | Admit: 2021-05-25 | Discharge: 2021-06-09 | DRG: 025 | Disposition: A | Discharge status: Skilled Nursing Facility | Payer: Medicare Other | Attending: Internal Medicine | Admitting: Internal Medicine

## 2021-05-25 ENCOUNTER — Other Ambulatory Visit: Payer: Self-pay

## 2021-05-25 DIAGNOSIS — E440 Moderate protein-calorie malnutrition: Secondary | ICD-10-CM | POA: Diagnosis present

## 2021-05-25 DIAGNOSIS — K5750 Diverticulosis of both small and large intestine without perforation or abscess without bleeding: Secondary | ICD-10-CM | POA: Diagnosis not present

## 2021-05-25 DIAGNOSIS — C7931 Secondary malignant neoplasm of brain: Principal | ICD-10-CM | POA: Diagnosis present

## 2021-05-25 DIAGNOSIS — R4182 Altered mental status, unspecified: Secondary | ICD-10-CM | POA: Diagnosis not present

## 2021-05-25 DIAGNOSIS — F419 Anxiety disorder, unspecified: Secondary | ICD-10-CM | POA: Diagnosis present

## 2021-05-25 DIAGNOSIS — I700 Atherosclerosis of aorta: Secondary | ICD-10-CM | POA: Diagnosis not present

## 2021-05-25 DIAGNOSIS — C3412 Malignant neoplasm of upper lobe, left bronchus or lung: Secondary | ICD-10-CM | POA: Diagnosis present

## 2021-05-25 DIAGNOSIS — F32A Depression, unspecified: Secondary | ICD-10-CM | POA: Diagnosis present

## 2021-05-25 DIAGNOSIS — R590 Localized enlarged lymph nodes: Secondary | ICD-10-CM | POA: Diagnosis not present

## 2021-05-25 DIAGNOSIS — L8990 Pressure ulcer of unspecified site, unspecified stage: Secondary | ICD-10-CM | POA: Insufficient documentation

## 2021-05-25 DIAGNOSIS — N179 Acute kidney failure, unspecified: Secondary | ICD-10-CM | POA: Diagnosis not present

## 2021-05-25 DIAGNOSIS — J439 Emphysema, unspecified: Secondary | ICD-10-CM | POA: Diagnosis not present

## 2021-05-25 DIAGNOSIS — G9340 Encephalopathy, unspecified: Secondary | ICD-10-CM | POA: Diagnosis present

## 2021-05-25 DIAGNOSIS — N200 Calculus of kidney: Secondary | ICD-10-CM | POA: Diagnosis not present

## 2021-05-25 DIAGNOSIS — R531 Weakness: Secondary | ICD-10-CM | POA: Diagnosis not present

## 2021-05-25 DIAGNOSIS — I10 Essential (primary) hypertension: Secondary | ICD-10-CM | POA: Diagnosis present

## 2021-05-25 DIAGNOSIS — N390 Urinary tract infection, site not specified: Secondary | ICD-10-CM | POA: Diagnosis present

## 2021-05-25 DIAGNOSIS — R0600 Dyspnea, unspecified: Secondary | ICD-10-CM

## 2021-05-25 DIAGNOSIS — J9811 Atelectasis: Secondary | ICD-10-CM | POA: Diagnosis not present

## 2021-05-25 DIAGNOSIS — Z853 Personal history of malignant neoplasm of breast: Secondary | ICD-10-CM

## 2021-05-25 DIAGNOSIS — E860 Dehydration: Secondary | ICD-10-CM | POA: Diagnosis present

## 2021-05-25 DIAGNOSIS — Z9889 Other specified postprocedural states: Secondary | ICD-10-CM

## 2021-05-25 DIAGNOSIS — J9621 Acute and chronic respiratory failure with hypoxia: Secondary | ICD-10-CM | POA: Diagnosis present

## 2021-05-25 DIAGNOSIS — Z6824 Body mass index (BMI) 24.0-24.9, adult: Secondary | ICD-10-CM

## 2021-05-25 DIAGNOSIS — Z20822 Contact with and (suspected) exposure to covid-19: Secondary | ICD-10-CM | POA: Diagnosis present

## 2021-05-25 DIAGNOSIS — J9622 Acute and chronic respiratory failure with hypercapnia: Secondary | ICD-10-CM | POA: Diagnosis present

## 2021-05-25 DIAGNOSIS — E43 Unspecified severe protein-calorie malnutrition: Secondary | ICD-10-CM | POA: Insufficient documentation

## 2021-05-25 DIAGNOSIS — G9341 Metabolic encephalopathy: Secondary | ICD-10-CM | POA: Diagnosis present

## 2021-05-25 DIAGNOSIS — Z87891 Personal history of nicotine dependence: Secondary | ICD-10-CM

## 2021-05-25 DIAGNOSIS — G936 Cerebral edema: Secondary | ICD-10-CM | POA: Diagnosis present

## 2021-05-25 DIAGNOSIS — R9431 Abnormal electrocardiogram [ECG] [EKG]: Secondary | ICD-10-CM | POA: Diagnosis not present

## 2021-05-25 DIAGNOSIS — I2510 Atherosclerotic heart disease of native coronary artery without angina pectoris: Secondary | ICD-10-CM | POA: Diagnosis present

## 2021-05-25 DIAGNOSIS — J431 Panlobular emphysema: Secondary | ICD-10-CM | POA: Diagnosis present

## 2021-05-25 DIAGNOSIS — G9389 Other specified disorders of brain: Secondary | ICD-10-CM

## 2021-05-25 DIAGNOSIS — Z882 Allergy status to sulfonamides status: Secondary | ICD-10-CM

## 2021-05-25 DIAGNOSIS — L304 Erythema intertrigo: Secondary | ICD-10-CM | POA: Diagnosis present

## 2021-05-25 DIAGNOSIS — G4700 Insomnia, unspecified: Secondary | ICD-10-CM | POA: Diagnosis present

## 2021-05-25 DIAGNOSIS — K5900 Constipation, unspecified: Secondary | ICD-10-CM | POA: Diagnosis present

## 2021-05-25 HISTORY — DX: Malignant neoplasm of unspecified site of unspecified female breast: C50.919

## 2021-05-25 LAB — COMPREHENSIVE METABOLIC PANEL
ALT: 14 U/L (ref 0–44)
AST: 34 U/L (ref 15–41)
Albumin: 4.2 g/dL (ref 3.5–5.0)
Alkaline Phosphatase: 56 U/L (ref 38–126)
Anion gap: 5 (ref 5–15)
BUN: 39 mg/dL — ABNORMAL HIGH (ref 8–23)
CO2: 35 mmol/L — ABNORMAL HIGH (ref 22–32)
Calcium: 9.2 mg/dL (ref 8.9–10.3)
Chloride: 95 mmol/L — ABNORMAL LOW (ref 98–111)
Creatinine, Ser: 1.76 mg/dL — ABNORMAL HIGH (ref 0.44–1.00)
GFR, Estimated: 30 mL/min — ABNORMAL LOW (ref >60)
Glucose, Bld: 122 mg/dL — ABNORMAL HIGH (ref 70–99)
Potassium: 4.4 mmol/L (ref 3.5–5.1)
Sodium: 135 mmol/L (ref 135–145)
Total Bilirubin: 0.7 mg/dL (ref 0.3–1.2)
Total Protein: 7.3 g/dL (ref 6.5–8.1)

## 2021-05-25 LAB — URINALYSIS, ROUTINE W REFLEX MICROSCOPIC
Bilirubin Urine: NEGATIVE
Glucose, UA: NEGATIVE mg/dL
Ketones, ur: 20 mg/dL — AB
Nitrite: NEGATIVE
Protein, ur: 30 mg/dL — AB
Specific Gravity, Urine: 1.013 (ref 1.005–1.030)
WBC, UA: >50 WBC/hpf — ABNORMAL HIGH (ref 0–5)
pH: 6 (ref 5.0–8.0)

## 2021-05-25 LAB — CBC WITH DIFFERENTIAL/PLATELET
Abs Immature Granulocytes: 0.04 10*3/uL (ref 0.00–0.07)
Basophils Absolute: 0 10*3/uL (ref 0.0–0.1)
Basophils Relative: 0 %
Eosinophils Absolute: 0 10*3/uL (ref 0.0–0.5)
Eosinophils Relative: 0 %
HCT: 44.2 % (ref 36.0–46.0)
Hemoglobin: 13.7 g/dL (ref 12.0–15.0)
Immature Granulocytes: 1 %
Lymphocytes Relative: 8 %
Lymphs Abs: 0.7 10*3/uL (ref 0.7–4.0)
MCH: 29.6 pg (ref 26.0–34.0)
MCHC: 31 g/dL (ref 30.0–36.0)
MCV: 95.5 fL (ref 80.0–100.0)
Monocytes Absolute: 0.5 10*3/uL (ref 0.1–1.0)
Monocytes Relative: 6 %
Neutro Abs: 7.4 10*3/uL (ref 1.7–7.7)
Neutrophils Relative %: 85 %
Platelets: 282 10*3/uL (ref 150–400)
RBC: 4.63 MIL/uL (ref 3.87–5.11)
RDW: 12.6 % (ref 11.5–15.5)
WBC: 8.8 10*3/uL (ref 4.0–10.5)
nRBC: 0 % (ref 0.0–0.2)

## 2021-05-25 LAB — BLOOD GAS, ARTERIAL
Acid-Base Excess: 4.9 mmol/L — ABNORMAL HIGH (ref 0.0–2.0)
Bicarbonate: 27.2 mmol/L (ref 20.0–28.0)
FIO2: 36
O2 Saturation: 98.2 %
Patient temperature: 36.6
pCO2 arterial: 68.6 mmHg (ref 32.0–48.0)
pH, Arterial: 7.279 — ABNORMAL LOW (ref 7.350–7.450)
pO2, Arterial: 126 mmHg — ABNORMAL HIGH (ref 83.0–108.0)

## 2021-05-25 LAB — BRAIN NATRIURETIC PEPTIDE: B Natriuretic Peptide: 68 pg/mL (ref 0.0–100.0)

## 2021-05-25 LAB — CBG MONITORING, ED: Glucose-Capillary: 101 mg/dL — ABNORMAL HIGH (ref 70–99)

## 2021-05-25 LAB — TROPONIN I (HIGH SENSITIVITY): Troponin I (High Sensitivity): 16 ng/L (ref <18)

## 2021-05-25 LAB — RESP PANEL BY RT-PCR (FLU A&B, COVID) ARPGX2
Influenza A by PCR: NEGATIVE
Influenza B by PCR: NEGATIVE
SARS Coronavirus 2 by RT PCR: NEGATIVE

## 2021-05-25 MED ORDER — SODIUM CHLORIDE 0.9 % IV SOLN
1.0000 g | INTRAVENOUS | Status: DC
  Administered 2021-05-25 – 2021-05-28 (×4): 1 g via INTRAVENOUS
  Filled 2021-05-25 (×4): qty 10

## 2021-05-25 MED ORDER — IPRATROPIUM BROMIDE 0.02 % IN SOLN
0.5000 mg | Freq: Four times a day (QID) | RESPIRATORY_TRACT | Status: DC | PRN
  Administered 2021-05-30: 0.5 mg via RESPIRATORY_TRACT
  Filled 2021-05-25: qty 2.5

## 2021-05-25 MED ORDER — BISACODYL 5 MG PO TBEC
5.0000 mg | DELAYED_RELEASE_TABLET | Freq: Every day | ORAL | Status: DC | PRN
  Administered 2021-06-09: 5 mg via ORAL
  Filled 2021-05-25: qty 1

## 2021-05-25 MED ORDER — ACETAMINOPHEN 325 MG PO TABS
650.0000 mg | ORAL_TABLET | Freq: Four times a day (QID) | ORAL | Status: DC | PRN
  Administered 2021-05-27 – 2021-06-07 (×8): 650 mg via ORAL
  Filled 2021-05-25 (×9): qty 2

## 2021-05-25 MED ORDER — CITALOPRAM HYDROBROMIDE 20 MG PO TABS
20.0000 mg | ORAL_TABLET | Freq: Every day | ORAL | Status: DC
  Administered 2021-05-25 – 2021-06-09 (×15): 20 mg via ORAL
  Filled 2021-05-25: qty 1
  Filled 2021-05-25 (×4): qty 2
  Filled 2021-05-25: qty 1
  Filled 2021-05-25 (×2): qty 2
  Filled 2021-05-25 (×2): qty 1
  Filled 2021-05-25 (×5): qty 2

## 2021-05-25 MED ORDER — HYDROMORPHONE HCL 1 MG/ML IJ SOLN: 0.5000 mg | INTRAMUSCULAR | Status: DC | PRN

## 2021-05-25 MED ORDER — ACETAMINOPHEN 650 MG RE SUPP: 650.0000 mg | Freq: Four times a day (QID) | RECTAL | Status: DC | PRN

## 2021-05-25 MED ORDER — DEXAMETHASONE SODIUM PHOSPHATE 10 MG/ML IJ SOLN
10.0000 mg | Freq: Once | INTRAMUSCULAR | Status: AC
  Administered 2021-05-25: 10 mg via INTRAVENOUS
  Filled 2021-05-25: qty 1

## 2021-05-25 MED ORDER — SENNOSIDES-DOCUSATE SODIUM 8.6-50 MG PO TABS: 1.0000 | ORAL_TABLET | Freq: Every evening | ORAL | Status: DC | PRN

## 2021-05-25 MED ORDER — OXYCODONE HCL 5 MG PO TABS: 5.0000 mg | ORAL_TABLET | ORAL | Status: DC | PRN

## 2021-05-25 MED ORDER — SODIUM CHLORIDE 0.9% FLUSH
3.0000 mL | Freq: Two times a day (BID) | INTRAVENOUS | Status: DC
  Administered 2021-05-26 – 2021-06-09 (×27): 3 mL via INTRAVENOUS

## 2021-05-25 MED ORDER — CLOPIDOGREL BISULFATE 75 MG PO TABS
75.0000 mg | ORAL_TABLET | Freq: Every day | ORAL | Status: DC
  Administered 2021-05-25 – 2021-05-26 (×2): 75 mg via ORAL
  Filled 2021-05-25 (×2): qty 1

## 2021-05-25 MED ORDER — LEVALBUTEROL HCL 0.63 MG/3ML IN NEBU
0.6300 mg | INHALATION_SOLUTION | Freq: Four times a day (QID) | RESPIRATORY_TRACT | Status: DC | PRN
  Administered 2021-05-25 – 2021-05-30 (×2): 0.63 mg via RESPIRATORY_TRACT
  Filled 2021-05-25 (×2): qty 3

## 2021-05-25 MED ORDER — LACTATED RINGERS IV BOLUS
1000.0000 mL | Freq: Once | INTRAVENOUS | Status: AC
  Administered 2021-05-25: 08:00:00 1000 mL via INTRAVENOUS

## 2021-05-25 MED ORDER — MAGNESIUM CITRATE PO SOLN: 1.0000 | Freq: Once | ORAL | Status: DC | PRN

## 2021-05-25 MED ORDER — DEXAMETHASONE SODIUM PHOSPHATE 4 MG/ML IJ SOLN
4.0000 mg | Freq: Four times a day (QID) | INTRAMUSCULAR | Status: DC
  Administered 2021-05-25 – 2021-06-07 (×49): 4 mg via INTRAVENOUS
  Filled 2021-05-25 (×49): qty 1

## 2021-05-25 MED ORDER — ADULT MULTIVITAMIN W/MINERALS CH
1.0000 | ORAL_TABLET | Freq: Every day | ORAL | Status: DC
  Administered 2021-05-25 – 2021-06-09 (×15): 1 via ORAL
  Filled 2021-05-25 (×15): qty 1

## 2021-05-25 MED ORDER — HYDRALAZINE HCL 20 MG/ML IJ SOLN
10.0000 mg | INTRAMUSCULAR | Status: DC | PRN
  Administered 2021-05-28 – 2021-06-03 (×4): 10 mg via INTRAVENOUS
  Filled 2021-05-25 (×4): qty 1

## 2021-05-25 MED ORDER — LORAZEPAM 2 MG/ML IJ SOLN
0.5000 mg | Freq: Four times a day (QID) | INTRAMUSCULAR | Status: DC | PRN
  Administered 2021-05-26 – 2021-05-30 (×6): 0.5 mg via INTRAVENOUS
  Filled 2021-05-25 (×7): qty 1

## 2021-05-25 MED ORDER — SODIUM CHLORIDE 0.9 % IV SOLN: 250.0000 mL | INTRAVENOUS | Status: DC | PRN

## 2021-05-25 MED ORDER — ONDANSETRON HCL 4 MG PO TABS: 4.0000 mg | ORAL_TABLET | Freq: Four times a day (QID) | ORAL | Status: DC | PRN

## 2021-05-25 MED ORDER — LACTATED RINGERS IV BOLUS: 500.0000 mL | Freq: Once | INTRAVENOUS | Status: DC

## 2021-05-25 MED ORDER — TRAZODONE HCL 50 MG PO TABS
25.0000 mg | ORAL_TABLET | Freq: Every evening | ORAL | Status: DC | PRN
  Administered 2021-05-30 – 2021-06-08 (×5): 25 mg via ORAL
  Filled 2021-05-25 (×6): qty 1

## 2021-05-25 MED ORDER — METOPROLOL TARTRATE 12.5 MG HALF TABLET
12.5000 mg | ORAL_TABLET | Freq: Two times a day (BID) | ORAL | Status: DC
  Administered 2021-05-25 – 2021-06-09 (×31): 12.5 mg via ORAL
  Filled 2021-05-25 (×31): qty 1

## 2021-05-25 MED ORDER — HEPARIN SODIUM (PORCINE) 5000 UNIT/ML IJ SOLN
5000.0000 [IU] | Freq: Three times a day (TID) | INTRAMUSCULAR | Status: AC
  Administered 2021-05-25 – 2021-06-03 (×28): 5000 [IU] via SUBCUTANEOUS
  Filled 2021-05-25 (×27): qty 1

## 2021-05-25 MED ORDER — SODIUM CHLORIDE 0.9% FLUSH: 3.0000 mL | INTRAVENOUS | Status: DC | PRN

## 2021-05-25 MED ORDER — SODIUM CHLORIDE 0.9 % IV SOLN: INTRAVENOUS | Status: AC

## 2021-05-25 MED ORDER — SODIUM CHLORIDE 0.9% FLUSH
3.0000 mL | Freq: Two times a day (BID) | INTRAVENOUS | Status: DC
  Administered 2021-05-26 – 2021-05-30 (×7): 3 mL via INTRAVENOUS

## 2021-05-25 MED ORDER — ONDANSETRON HCL 4 MG/2ML IJ SOLN: 4.0000 mg | Freq: Four times a day (QID) | INTRAMUSCULAR | Status: DC | PRN

## 2021-05-25 MED ORDER — SIMVASTATIN 20 MG PO TABS
20.0000 mg | ORAL_TABLET | Freq: Every day | ORAL | Status: DC
  Administered 2021-05-25 – 2021-06-09 (×16): 20 mg via ORAL
  Filled 2021-05-25 (×16): qty 1

## 2021-05-25 MED ORDER — GADOBUTROL 1 MMOL/ML IV SOLN
7.5000 mL | Freq: Once | INTRAVENOUS | Status: AC | PRN
  Administered 2021-05-25: 7.5 mL via INTRAVENOUS

## 2021-05-25 NOTE — ED Triage Notes (Signed)
Pt states that she has generalized weakness x 10 days post abx for UTI et post steroid dose pack for resp distress from pcp.

## 2021-05-25 NOTE — Progress Notes (Signed)
Patient arrived to 3w-28, Md Trenton Gammon, and [place] notified, vitals taken, patient in no distress.

## 2021-05-25 NOTE — Progress Notes (Signed)
OT Cancellation Note  Patient Details Name: Barbara Gibson MRN: [id number] DOB: Dec 05, 1945   Cancelled Treatment:    Reason Eval/Treat Not Completed: Patient not medically ready. Per RN, pt SOB lying supine in bed, requests therapy come back at later time  [facility] OT, MOT  Larey Seat 05/25/2021, 9:37 AM

## 2021-05-25 NOTE — Progress Notes (Signed)
**Note De-Identified  Obfuscation** RT note: ABG collected x2  for clot per lab

## 2021-05-25 NOTE — Consult Note (Signed)
[place] A. Merlene Laughter, MD     [email or web address]          Barbara Gibson is an 75 y.o. female.   ASSESSMENT/PLAN: Chart reviewed but pt not seen. Transferred to [facility]   Blood pressure (!) 133/44, pulse 75, temperature 97.6 F (36.4 C), temperature source Oral, resp. rate (!) 24, SpO2 98 %.  Past Medical History:  Diagnosis Date   Anxiety    COPD (chronic obstructive pulmonary disease) ([place])    Depression    High cholesterol    Hyperlipidemia    Myocardial infarction ([place]) 10/22/2014   "mild"   On home oxygen therapy    "2L; 24/7" (10/24/2014)   Pneumonia 04/2013    Past Surgical History:  Procedure Laterality Date   CARDIAC CATHETERIZATION  10/24/2014   LEFT HEART CATHETERIZATION WITH CORONARY ANGIOGRAM N/A 10/24/2014   Procedure: LEFT HEART CATHETERIZATION WITH CORONARY ANGIOGRAM;  Surgeon: Laverda Page, MD;  Location: [facility] CATH LAB;  Service: Cardiovascular;  Laterality: N/A;   TUBAL LIGATION  1980's    History reviewed. No pertinent family history.  Social History:  reports that she quit smoking about 8 years ago. Her smoking use included cigarettes. She has a 78.00 pack-year smoking history. She has never used smokeless tobacco. She reports that she does not drink alcohol and does not use drugs.  Allergies:  Allergies  Allergen Reactions   Sulfa Antibiotics Nausea And Vomiting    Medications: Prior to Admission medications   Medication Sig Start Date End Date Taking? Authorizing Provider  acetaZOLAMIDE (DIAMOX) 250 MG tablet Take 250 mg by mouth 2 (two) times daily. 05/20/21  Yes [provider]  ALPRAZolam Duanne Moron) 1 MG tablet Take 1 mg by mouth in the morning and at bedtime.   Yes [provider]  ANORO ELLIPTA 62.5-25 MCG/INH AEPB Inhale 1 puff into the lungs daily. 12/26/20  Yes [provider]  benazepril (LOTENSIN) 5 MG tablet Take 5 mg by mouth daily. 02/24/21  Yes [provider]  buPROPion  (WELLBUTRIN XL) 150 MG 24 hr tablet Take 150 mg by mouth daily. 05/17/21  Yes [provider]  Cholecalciferol 50 MCG (2000 UT) CAPS Take 1 capsule by mouth daily. 06/29/20  Yes [provider]  citalopram (CELEXA) 20 MG tablet Take 20 mg by mouth daily.   Yes [provider]  clopidogrel (PLAVIX) 75 MG tablet Take 1 tablet (75 mg total) by mouth daily. 10/24/14  Yes Adrian Prows, MD  metoprolol tartrate (LOPRESSOR) 25 MG tablet Take 0.5 tablets (12.5 mg total) by mouth 2 (two) times daily. Patient taking differently: Take 25 mg by mouth 2 (two) times daily. 10/24/14  Yes Adrian Prows, MD  Multiple Vitamin (MULTIVITAMIN WITH MINERALS) TABS tablet Take 1 tablet by mouth daily.   Yes [provider]  Probiotic Product (PROBIOTIC PO) Take 1 tablet by mouth in the morning and at bedtime.   Yes [provider]  simvastatin (ZOCOR) 20 MG tablet Take 20 mg by mouth daily. 01/30/17  Yes [provider]    Scheduled Meds:  citalopram  20 mg Oral Daily   clopidogrel  75 mg Oral Daily   dexamethasone (DECADRON) injection  10 mg Intravenous Once   dexamethasone (DECADRON) injection  4 mg Intravenous Q6H   heparin  5,000 Units Subcutaneous Q8H   metoprolol tartrate  12.5 mg Oral BID   multivitamin with minerals  1 tablet Oral Daily   simvastatin  20 mg Oral q1800   sodium  chloride flush  3 mL Intravenous Q12H   sodium chloride flush  3 mL Intravenous Q12H   Continuous Infusions:  sodium chloride 50 mL/hr at 05/25/21 1238   sodium chloride     cefTRIAXone (ROCEPHIN)  IV 1 g (05/25/21 1242)   PRN Meds:.sodium chloride, acetaminophen **OR** acetaminophen, bisacodyl, hydrALAZINE, HYDROmorphone (DILAUDID) injection, ipratropium, levalbuterol, LORazepam, magnesium citrate, ondansetron **OR** ondansetron (ZOFRAN) IV, oxyCODONE, senna-docusate, sodium chloride flush, traZODone     Results for orders placed or performed during the hospital encounter of 05/25/21  (from the past 48 hour(s))  Comprehensive metabolic panel     Status: Abnormal   Collection Time: 05/25/21  6:03 AM  Result Value Ref Range   Sodium 135 135 - 145 mmol/L   Potassium 4.4 3.5 - 5.1 mmol/L   Chloride 95 (L) 98 - 111 mmol/L   CO2 35 (H) 22 - 32 mmol/L   Glucose, Bld 122 (H) 70 - 99 mg/dL   BUN 39 (H) 8 - 23 mg/dL   Creatinine, Ser 1.76 (H) 0.44 - 1.00 mg/dL   Calcium 9.2 8.9 - 10.3 mg/dL   Total Protein 7.3 6.5 - 8.1 g/dL   Albumin 4.2 3.5 - 5.0 g/dL   AST 34 15 - 41 U/L   ALT 14 0 - 44 U/L   Alkaline Phosphatase 56 38 - 126 U/L   Total Bilirubin 0.7 0.3 - 1.2 mg/dL   GFR, Estimated 30 (L) >60 mL/min    Comment: (NOTE) Calculated using the CKD-EPI Creatinine Equation (2021)    Anion gap 5 5 - 15    Comment: Performed at [facility], [address], [place], [place] [address]  CBC with Differential     Status: None   Collection Time: 05/25/21  6:03 AM  Result Value Ref Range   WBC 8.8 4.0 - 10.5 K/uL   RBC 4.63 3.87 - 5.11 MIL/uL   Hemoglobin 13.7 12.0 - 15.0 g/dL   HCT 44.2 36.0 - 46.0 %   MCV 95.5 80.0 - 100.0 fL   MCH 29.6 26.0 - 34.0 pg   MCHC 31.0 30.0 - 36.0 g/dL   RDW 12.6 11.5 - 15.5 %   Platelets 282 150 - 400 K/uL   nRBC 0.0 0.0 - 0.2 %   Neutrophils Relative % 85 %   Neutro Abs 7.4 1.7 - 7.7 K/uL   Lymphocytes Relative 8 %   Lymphs Abs 0.7 0.7 - 4.0 K/uL   Monocytes Relative 6 %   Monocytes Absolute 0.5 0.1 - 1.0 K/uL   Eosinophils Relative 0 %   Eosinophils Absolute 0.0 0.0 - 0.5 K/uL   Basophils Relative 0 %   Basophils Absolute 0.0 0.0 - 0.1 K/uL   Immature Granulocytes 1 %   Abs Immature Granulocytes 0.04 0.00 - 0.07 K/uL    Comment: Performed at [facility], [address], [place], [place] [address]  Troponin I (High Sensitivity)     Status: None   Collection Time: 05/25/21  6:03 AM  Result Value Ref Range   Troponin I (High Sensitivity) 16 <18 ng/L    Comment: (NOTE) Elevated high sensitivity troponin I (hsTnI) values and  significant  changes across serial measurements may suggest ACS but many other  chronic and acute conditions are known to elevate hsTnI results.  Refer to the Links section for chest pain algorithms and additional  guidance. Performed at [facility], [address] [place], [place] [address]   Brain natriuretic peptide     Status:  None   Collection Time: 05/25/21  6:03 AM  Result Value Ref Range   B Natriuretic Peptide 68.0 0.0 - 100.0 pg/mL    Comment: Performed at [facility], [address] [place], [place] [address]  Resp Panel by RT-PCR (Flu A&B, Covid) Nasopharyngeal Swab     Status: None   Collection Time: 05/25/21  7:13 AM   Specimen: Nasopharyngeal Swab; Nasopharyngeal(NP) swabs in vial transport medium  Result Value Ref Range   SARS Coronavirus 2 by RT PCR NEGATIVE NEGATIVE    Comment: (NOTE) SARS-CoV-2 target nucleic acids are NOT DETECTED.  The SARS-CoV-2 RNA is generally detectable in upper respiratory specimens during the acute phase of infection. The lowest concentration of SARS-CoV-2 viral copies this assay can detect is 138 copies/mL. A negative result does not preclude SARS-Cov-2 infection and should not be used as the sole basis for treatment or other patient management decisions. A negative result may occur with  improper specimen collection/handling, submission of specimen other than nasopharyngeal swab, presence of viral mutation(s) within the areas targeted by this assay, and inadequate number of viral copies(<138 copies/mL). A negative result must be combined with clinical observations, patient history, and epidemiological information. The expected result is Negative.  Fact Sheet for Patients:  EntrepreneurPulse.com.au  Fact Sheet for Healthcare Providers:  IncredibleEmployment.be  This test is no t yet approved or cleared by the [place] FDA and  has been authorized for detection and/or diagnosis of SARS-CoV-2  by FDA under an Emergency Use Authorization (EUA). This EUA will remain  in effect (meaning this test can be used) for the duration of the COVID-19 declaration under Section 564(b)(1) of the Act, 21 U.S.C.section 360bbb-3(b)(1), unless the authorization is terminated  or revoked sooner.       Influenza A by PCR NEGATIVE NEGATIVE   Influenza B by PCR NEGATIVE NEGATIVE    Comment: (NOTE) The Xpert Xpress SARS-CoV-2/FLU/RSV plus assay is intended as an aid in the diagnosis of influenza from Nasopharyngeal swab specimens and should not be used as a sole basis for treatment. Nasal washings and aspirates are unacceptable for Xpert Xpress SARS-CoV-2/FLU/RSV testing.  Fact Sheet for Patients: EntrepreneurPulse.com.au  Fact Sheet for Healthcare Providers: IncredibleEmployment.be  This test is not yet approved or cleared by the [place] FDA and has been authorized for detection and/or diagnosis of SARS-CoV-2 by FDA under an Emergency Use Authorization (EUA). This EUA will remain in effect (meaning this test can be used) for the duration of the COVID-19 declaration under Section 564(b)(1) of the Act, 21 U.S.C. section 360bbb-3(b)(1), unless the authorization is terminated or revoked.  Performed at [facility], [address], [place], [place] [address]   CBG monitoring, ED     Status: Abnormal   Collection Time: 05/25/21  8:15 AM  Result Value Ref Range   Glucose-Capillary 101 (H) 70 - 99 mg/dL    Comment: Glucose reference range applies only to samples taken after fasting for at least 8 hours.  Urinalysis, Routine w reflex microscopic     Status: Abnormal   Collection Time: 05/25/21  8:58 AM  Result Value Ref Range   Color, Urine YELLOW YELLOW   APPearance HAZY (A) CLEAR   Specific Gravity, Urine 1.013 1.005 - 1.030   pH 6.0 5.0 - 8.0   Glucose, UA NEGATIVE NEGATIVE mg/dL   Hgb urine dipstick SMALL (A) NEGATIVE   Bilirubin Urine NEGATIVE  NEGATIVE   Ketones, ur 20 (A) NEGATIVE mg/dL   Protein, ur 30 (A) NEGATIVE  mg/dL   Nitrite NEGATIVE NEGATIVE   Leukocytes,Ua LARGE (A) NEGATIVE   RBC / HPF 0-5 0 - 5 RBC/hpf   WBC, UA >50 (H) 0 - 5 WBC/hpf   Bacteria, UA RARE (A) NONE SEEN   Squamous Epithelial / LPF 0-5 0 - 5   WBC Clumps PRESENT    Mucus PRESENT    Budding Yeast PRESENT    Hyaline Casts, UA PRESENT    Ca Oxalate Crys, UA PRESENT     Comment: Performed at [facility], [address] [place], [place] [address]  Blood gas, arterial     Status: Abnormal   Collection Time: 05/25/21  9:09 AM  Result Value Ref Range   FIO2 36.00    pH, Arterial 7.279 (L) 7.350 - 7.450   pCO2 arterial 68.6 (HH) 32.0 - 48.0 mmHg    Comment: CRITICAL RESULT CALLED TO, READ BACK BY AND VERIFIED WITH: DILDY @ 0924 ON [id number] BY HENDERSON L    pO2, Arterial 126 (H) 83.0 - 108.0 mmHg   Bicarbonate 27.2 20.0 - 28.0 mmol/L   Acid-Base Excess 4.9 (H) 0.0 - 2.0 mmol/L   O2 Saturation 98.2 %   Patient temperature 36.6    Allens test (pass/fail) PASS PASS    Comment: Performed at [facility], [address] [place], [place] [address]    Studies/Results:   BRAIN MRI  FINDINGS: Brain: There is a 2.7 x 2.7 cm rounded mass of the anteroinferior right frontal lobe with extensive surrounding edema. There is regional mass effect including leftward midline shift with subfalcine herniation measuring 1 cm. No significant central herniation.   No evidence of acute infarction or hemorrhage. Additional patchy T2 hyperintensity in the supratentorial and pontine white matter is nonspecific but probably reflects chronic microvascular ischemic changes. There is no extra-axial collection. There is no hydrocephalus.   Vascular: Major vessel flow voids at the skull base are preserved.   Skull and upper cervical spine: Normal marrow signal is preserved.   Sinuses/Orbits: Minor mucosal thickening.  Orbits are unremarkable.   Other: Sella is  unremarkable.  Mastoid air cells are clear.   IMPRESSION: 2.7 cm anterior inferior right frontal lobe mass with extensive surrounding edema. Metastasis is the primary differential consideration. There is 1 cm of leftward midline shift. No hydrocephalus.   Postcontrast imaging is recommended to evaluate for additional lesions.     The brain MRI is reviewed in person and shows a large mass involving the right frontal region with massive surrounding vasogenic edema causing midline shift and subfalcine herniation. Lesion is mostly isodense on T1 and T2. It is dark on DWI with surrounding hyper intensity. No hemorrhage appreciated. 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  Barbara Gibson A. Merlene Laughter, M.D.  Diplomate, Tax adviser of Psychiatry and Neurology ( Neurology). 05/25/2021, 2:25 PM

## 2021-05-25 NOTE — Therapy (Signed)
PT Cancellation Note  Patient Details Name: Barbara Gibson MRN: [id number] DOB: 05-23-1946   Cancelled Treatment:    Reason Eval/Treat Not Completed: Patient not medically ready. Per RN, pt SOB lying supine in bed, requests therapy come back at later time for PT eval. Will check back as schedule permits.    Talbot Grumbling PT, DPT 05/25/21, 8:34 AM

## 2021-05-25 NOTE — ED Provider Notes (Signed)
[facility] EMERGENCY DEPARTMENT Provider Note   CSN: [id number] Arrival date & time: 05/25/21  4270     History Chief Complaint  Patient presents with   Weakness    Barbara Gibson is a 75 y.o. female.  The history is provided by the patient.  Weakness She has history of hyperlipidemia, coronary artery disease, COPD and comes in complaining of weakness for the last 3 days.  She states that she had received a course of antibiotics for UTI and also a steroid Dosepak for COPD exacerbation.  She denies running a fever at home denies chills or sweats.  There has been no nausea or vomiting but she started having diarrhea yesterday.  She had 2 watery bowel movements.  She denies any blood or mucus in the stool.  She does admit to being slightly short of breath but denies any pain anywhere and specifically denies any chest pain or heaviness or tightness or pressure.   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   Coronary artery disease involving native coronary artery of native heart without angina pectoris 03/31/2016   NSTEMI, initial episode of care [facility]) 10/23/2014    Past Surgical History:  Procedure Laterality Date   CARDIAC CATHETERIZATION  10/24/2014   LEFT HEART CATHETERIZATION WITH CORONARY ANGIOGRAM N/A 10/24/2014   Procedure: LEFT HEART CATHETERIZATION WITH CORONARY ANGIOGRAM;  Surgeon: Laverda Page, MD;  Location: [facility] CATH LAB;  Service: Cardiovascular;  Laterality: N/A;   TUBAL LIGATION  1980's     OB History   No obstetric history on file.     History reviewed. No pertinent family history.  Social History    Tobacco Use   Smoking status: Former    Packs/day: 1.50    Years: 52.00    Pack years: 78.00    Types: Cigarettes    Quit date: 12/23/2012    Years since quitting: 8.4   Smokeless tobacco: Never  Substance Use Topics   Alcohol use: No    Alcohol/week: 0.0 standard drinks   Drug use: No    Home Medications Prior to Admission medications   Medication Sig Start Date End Date Taking? Authorizing Provider  albuterol (PROVENTIL) (2.5 MG/3ML) 0.083% nebulizer solution Take 2.5 mg by nebulization every 6 (six) hours as needed for wheezing or shortness of breath.    [provider]  ALPRAZolam Duanne Moron) 1 MG tablet Take 1 mg by mouth 3 (three) times daily as needed for anxiety.    [provider]  Biotin (BIOTIN MAXIMUM STRENGTH) 10 MG TABS Take 1 tablet by mouth daily.    [provider]  calcium gluconate 500 MG tablet Take 1 tablet by mouth daily.    [provider]  citalopram (CELEXA) 20 MG tablet Take 20 mg by mouth daily.    [provider]  clopidogrel (PLAVIX) 75 MG tablet Take 1 tablet (75 mg total) by mouth daily. 10/24/14   Adrian Prows, MD  metoprolol tartrate (LOPRESSOR) 25 MG tablet Take 0.5 tablets (12.5 mg total) by mouth 2 (two) times daily. 10/24/14   Adrian Prows, MD  Multiple Vitamin (MULTIVITAMIN WITH MINERALS) TABS tablet Take 1 tablet by mouth daily.    [provider]  Omega-3 Fatty Acids (FISH OIL) 1000 MG CAPS Take 1 capsule by mouth 2 (two) times daily.    [provider]  simvastatin (ZOCOR) 20 MG tablet  01/30/17   [provider]  Tiotropium Bromide Monohydrate 2.5 MCG/ACT AERS Inhale 18 mcg into the lungs 2 (two) times daily.     [provider]    Allergies    Sulfa antibiotics  Review of Systems   Review of Systems  Neurological:  Positive for weakness.  All other systems reviewed and are negative.  Physical Exam Updated Vital Signs BP (!) 137/59   Pulse 83   Temp 98.5 F  (36.9 C)   Resp (!) 22   SpO2 91%   Physical Exam Vitals and nursing note reviewed.  75 year old female, resting comfortably and in no acute distress. Vital signs are significant for slightly elevated respiratory rate. Oxygen saturation is 91%, which is normal. Head is normocephalic and atraumatic. PERRLA, EOMI. Oropharynx is clear. Neck is nontender and supple without adenopathy or JVD. Back is nontender and there is no CVA tenderness. Lungs are clear without rales, wheezes, or rhonchi. Chest is nontender. Heart has regular rate and rhythm without murmur. Abdomen is soft, flat, nontender without masses or hepatosplenomegaly and peristalsis is hypoactive. Extremities have no cyanosis or edema, full range of motion is present. Skin is warm and dry without rash. Neurologic: Mental status is normal, cranial nerves are intact, there are no motor or sensory deficits.  ED Results / Procedures / Treatments   Labs (all labs ordered are listed, but only abnormal results are displayed) Labs Reviewed  COMPREHENSIVE METABOLIC PANEL - Abnormal; Notable for the following components:      Result Value   Chloride 95 (*)    CO2 35 (*)    Glucose, Bld 122 (*)    BUN 39 (*)    Creatinine, Ser 1.76 (*)    GFR, Estimated 30 (*)    All other components within normal limits  C DIFFICILE QUICK SCREEN W PCR REFLEX    RESP PANEL BY RT-PCR (FLU A&B, COVID) ARPGX2  CBC WITH DIFFERENTIAL/PLATELET  URINALYSIS, ROUTINE W REFLEX MICROSCOPIC  BRAIN NATRIURETIC PEPTIDE  CBC  CREATININE, SERUM  URINALYSIS, ROUTINE W REFLEX MICROSCOPIC  TROPONIN I (HIGH SENSITIVITY)    EKG EKG Interpretation  Date/Time:  Tuesday May 25 2021 05:20:24 EDT Ventricular Rate:  82 PR Interval:  145 QRS Duration: 89 QT Interval:  356 QTC Calculation: 416 R Axis:   22 Text Interpretation: Sinus rhythm Right atrial enlargement Minimal ST depression, diffuse leads When compared with ECG of 10/24/2014, ST abnormality is now  present Confirmed by Delora Fuel ([id number]) on 05/25/2021 6:12:00 AM  Radiology DG Chest 2 View  Result Date: 05/25/2021 CLINICAL DATA:  Weakness. EXAM: CHEST - 2 VIEW COMPARISON:  Chest x-ray report 03/05/2016. FINDINGS: Mediastinum and hilar structures normal. Heart size normal. Low lung volumes with mild bibasilar atelectasis. Mild left base infiltrate cannot be excluded. No pleural effusion or pneumothorax. IMPRESSION: Low lung volumes with mild bibasilar atelectasis. Mild left base infiltrate cannot be excluded. Electronically Signed   By: Marcello Moores  Register   On: 05/25/2021 06:49    Procedures Procedures   Medications Ordered in ED Medications  lactated ringers bolus 1,000 mL (has no administration in time range)  sodium chloride flush (NS) 0.9 % injection 3 mL (has no administration  in time range)  0.9 %  sodium chloride infusion (has no administration in time range)  sodium chloride flush (NS) 0.9 % injection 3 mL (has no administration in time range)  sodium chloride flush (NS) 0.9 % injection 3 mL (has no administration in time range)  0.9 %  sodium chloride infusion (has no administration in time range)  acetaminophen (TYLENOL) tablet 650 mg (has no administration in time range)    Or  acetaminophen (TYLENOL) suppository 650 mg (has no administration in time range)  oxyCODONE (Oxy IR/ROXICODONE) immediate release tablet 5 mg (has no administration in time range)  HYDROmorphone (DILAUDID) injection 0.5-1 mg (has no administration in time range)  traZODone (DESYREL) tablet 25 mg (has no administration in time range)  senna-docusate (Senokot-S) tablet 1 tablet (has no administration in time range)  bisacodyl (DULCOLAX) EC tablet 5 mg (has no administration in time range)  magnesium citrate solution 1 Bottle (has no administration in time range)  ondansetron (ZOFRAN) tablet 4 mg (has no administration in time range)    Or  ondansetron (ZOFRAN) injection 4 mg (has no administration in  time range)  ipratropium (ATROVENT) nebulizer solution 0.5 mg (has no administration in time range)  levalbuterol (XOPENEX) nebulizer solution 0.63 mg (has no administration in time range)  hydrALAZINE (APRESOLINE) injection 10 mg (has no administration in time range)  heparin injection 5,000 Units (has no administration in time range)  lactated ringers bolus 500 mL (has no administration in time range)    ED Course  I have reviewed the triage vital signs and the nursing notes.  Pertinent labs & imaging results that were available during my care of the patient were reviewed by me and considered in my medical decision making (see chart for details).   MDM Rules/Calculators/A&P                         Generalized weakness of uncertain cause.  Consider occult infection such as UTI or pneumonia.  Consider electrolyte disturbance.  Consider angina equivalent.  Diarrhea in the setting of recent antibiotic administration, consider C. difficile.  Will check chest x-ray, screening labs, ECG.  Will check for C. difficile if she gives another stool sample.  Old records reviewed showing prior hospitalization for C. difficile enterocolitis.  Troponin is normal.  Electrolytes are unremarkable.  Renal function shows evidence of acute kidney injury with creatinine 1.76 compared with most recent value of 0.92 in April.  Chest x-ray is read as possible left lower lobe infiltrate.  I reviewed the images and did not feel there is significant infiltrate there.  WBC is normal but with a left shift.  Case is discussed with Dr. Roger Shelter of [facility] hospitalist who agrees to evaluate the patient for possible admission.  In the meantime, she is given IV fluids.  Of note, family member has arrived and states that she just completed her course of antibiotics yesterday and she had been on cefuroxime.  Family member does not know if the antibiotic was being given for respiratory infection or a urinary infection.  Final Clinical  Impression(s) / ED Diagnoses Final diagnoses:  Weakness  Acute kidney injury (nontraumatic) [facility])    Rx / DC Orders ED Discharge Orders     None        Delora Fuel, MD 82/50/53 [phone]

## 2021-05-25 NOTE — ED Notes (Signed)
Pt work of breathing noted to be labored and shallow. Pt RR is 26. VSS otherwise. MD made aware. Verbal order given for respiratory to do a ABG. Respiratory made aware. Will continue to monitor pt.

## 2021-05-25 NOTE — H&P (Addendum)
History and Physical   Patient: Barbara Gibson                            PCP: Barbara Burly, MD                    DOB: 08-06-1946            DOA: 05/25/2021 ZJI:[id number]             DOS: 05/25/2021, 2:34 PM  Patient coming from:   HOME  I have personally reviewed patient's medical records, in electronic medical records, including:  [place] link, and care everywhere.    Chief Complaint:   Chief Complaint  Patient presents with   Weakness    History of present illness:    Barbara Gibson is a 75 y.o. female with medical history significant of anxiety, COPD, depression, hyperlipidemia, CAD, history of MI, former smoker, history of recent UTI... Presenting with generalized weaknesses x3 days. Patient reports was diagnosed with UTI was treated with 3 days of antibiotics, also received some steroids for COPD exacerbation.  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 Mammogram on 02/03/2016 showed a 2.5 cm irregular mass with microcalcifications at the 3 to 4 pm position in the left breast. U/S confirmed that the mass was 5 cm from the nipple. U/S of the left axilla was negative. 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 She underwent needle localized lumpectomy with course complicated by abscesses that required repetetive IND and parenteral abx. She developed C diff colitis that required a 2 week hospitalization followed by SNF rehab. XRT was deferred due to cardiopulmonary and breast issues. Patient was placed on Letrazole 2.5 mg po daily in May 2017. Mammogram performed March 13th 2018 at [facility] was benign.    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 Began letrozole Letrazole 2.5 mg po daily in May 2017 which should be continued until May 2022.  Radiation has been deferred due to medical comorbidities. Mammogram in August 2021 BIRADS 2  .... "      Patient Denies having: Fever, Chills, Cough, SOB, Chest Pain, Abd pain, N/V/D, dizziness, lightheadedness,  Dysuria, Joint pain, rash, open wounds  ED Course:  Vitals: Temp 98.5, P 74, RR 25, 125/58, satting 100% on room air Abnormal labs; CBC within normal limits, CMP chloride 95 CO2 35 BUN 39 creatinine 1.76 glucose 122  Respiratory panel pending, UA pending,  Chest x-ray: IMPRESSION: Low lung volumes with mild bibasilar atelectasis. Mild left base infiltrate cannot be excluded.  ED provider requested patient to be admitted for close observation AKI, possible dehydration, undertreated UTI,  Review of Systems: As per HPI, otherwise 10 point review of systems were negative.   ----------------------------------------------------------------------------------------------------------------------  Allergies  Allergen Reactions   Sulfa Antibiotics Nausea And Vomiting    Home MEDs:  Prior to Admission medications   Medication Sig Start Date End Date Taking? Authorizing Provider  albuterol (PROVENTIL) (2.5 MG/3ML) 0.083% nebulizer solution Take 2.5 mg by nebulization every 6 (six) hours as needed for wheezing or shortness of breath.    [provider]  ALPRAZolam Duanne Moron) 1 MG tablet Take 1 mg by mouth 3 (three) times daily  as needed for anxiety.    [provider]  Biotin (BIOTIN MAXIMUM STRENGTH) 10 MG TABS Take 1 tablet by mouth daily.    [provider]  calcium gluconate 500 MG tablet Take 1 tablet by mouth daily.    [provider]  citalopram (CELEXA) 20 MG tablet Take 20 mg by mouth daily.    [provider]  clopidogrel (PLAVIX) 75 MG tablet Take 1 tablet (75 mg total) by mouth daily. 10/24/14   Barbara Prows, MD  metoprolol tartrate (LOPRESSOR) 25 MG tablet Take 0.5 tablets (12.5 mg total) by mouth 2 (two) times daily. 10/24/14    Barbara Prows, MD  Multiple Vitamin (MULTIVITAMIN WITH MINERALS) TABS tablet Take 1 tablet by mouth daily.    [provider]  Omega-3 Fatty Acids (FISH OIL) 1000 MG CAPS Take 1 capsule by mouth 2 (two) times daily.    [provider]  simvastatin (ZOCOR) 20 MG tablet  01/30/17   [provider]  Tiotropium Bromide Monohydrate 2.5 MCG/ACT AERS Inhale 18 mcg into the lungs 2 (two) times daily.     [provider]    PRN MEDs: sodium chloride, acetaminophen **OR** acetaminophen, bisacodyl, hydrALAZINE, HYDROmorphone (DILAUDID) injection, ipratropium, levalbuterol, LORazepam, magnesium citrate, ondansetron **OR** ondansetron (ZOFRAN) IV, oxyCODONE, senna-docusate, sodium chloride flush, traZODone  Past Medical History:  Diagnosis Date   Anxiety    COPD (chronic obstructive pulmonary disease) ([place])    Depression    High cholesterol    Hyperlipidemia    Myocardial infarction ([place]) 10/22/2014   "mild"   On home oxygen therapy    "2L; 24/7" (10/24/2014)   Pneumonia 04/2013    Past Surgical History:  Procedure Laterality Date   CARDIAC CATHETERIZATION  10/24/2014   LEFT HEART CATHETERIZATION WITH CORONARY ANGIOGRAM N/A 10/24/2014   Procedure: LEFT HEART CATHETERIZATION WITH CORONARY ANGIOGRAM;  Surgeon: Laverda Page, MD;  Location: [facility] CATH LAB;  Service: Cardiovascular;  Laterality: N/A;   TUBAL LIGATION  1980's     reports that she quit smoking about 8 years ago. Her smoking use included cigarettes. She has a 78.00 pack-year smoking history. She has never used smokeless tobacco. She reports that she does not drink alcohol and does not use drugs.   History reviewed. No pertinent family history.  Physical Exam:   Vitals:   05/25/21 0946 05/25/21 0958 05/25/21 1010 05/25/21 1213  BP: (!) 154/69   (!) 133/44  Pulse: 83   75  Resp:      Temp: 97.6 F (36.4 C)     TempSrc: Oral     SpO2: (!) 76% 98% 98%    Constitutional:, Easily arousable... On 4  L of oxygen satting 98% Shallow rapid breathing  ...  Eyes: PERRL, lids and conjunctivae normal ENMT: Mucous membranes are moist. Posterior pharynx clear of any exudate or lesions.Normal dentition.  Neck: normal, supple, no masses, no thyromegaly Respiratory: clear to auscultation bilaterally, no wheezing, no crackles. Normal respiratory effort. No accessory muscle use.  Cardiovascular: Regular rate and rhythm, no murmurs / rubs / gallops. No extremity edema. 2+ pedal pulses. No carotid bruits.  Abdomen: no tenderness, no masses palpated. No hepatosplenomegaly. Bowel sounds positive.  Musculoskeletal: no clubbing / cyanosis. No joint deformity upper and lower extremities. Good ROM, no contractures. Normal muscle tone.  Neurologic: CN II-XII grossly intact. Sensation intact, DTR normal. Strength 5/5 in all 4.  Psychiatric: Normal judgment and insight. Alert and oriented x 3. Normal mood.  Skin: no rashes, lesions,  ulcers. No induration  Wounds: per nursing documentation         Labs on admission:    I have personally reviewed following labs and imaging studies  CBC: Recent Labs  Lab 05/25/21 0603  WBC 8.8  NEUTROABS 7.4  HGB 13.7  HCT 44.2  MCV 95.5  PLT 950   Basic Metabolic Panel: Recent Labs  Lab 05/25/21 0603  NA 135  K 4.4  CL 95*  CO2 35*  GLUCOSE 122*  BUN 39*  CREATININE 1.76*  CALCIUM 9.2   GFR: CrCl cannot be calculated (Unknown ideal weight.). Liver Function Tests: Recent Labs  Lab 05/25/21 0603  AST 34  ALT 14  ALKPHOS 56  BILITOT 0.7  PROT 7.3  ALBUMIN 4.2   No results for input(s): LIPASE, AMYLASE in the last 168 hours. No results for input(s): AMMONIA in the last 168 hours. Coagulation Profile: No results for input(s): INR, PROTIME in the last 168 hours. Cardiac Enzymes: No results for input(s): CKTOTAL, CKMB, CKMBINDEX, TROPONINI in the last 168 hours. BNP (last 3 results) No results for input(s): PROBNP in the last 8760  hours. HbA1C: No results for input(s): HGBA1C in the last 72 hours. CBG: Recent Labs  Lab 05/25/21 0815  GLUCAP 101*   Lipid Profile: No results for input(s): CHOL, HDL, LDLCALC, TRIG, CHOLHDL, LDLDIRECT in the last 72 hours. Thyroid Function Tests: No results for input(s): TSH, T4TOTAL, FREET4, T3FREE, THYROIDAB in the last 72 hours. Anemia Panel: No results for input(s): VITAMINB12, FOLATE, FERRITIN, TIBC, IRON, RETICCTPCT in the last 72 hours. Urine analysis:    Component Value Date/Time   COLORURINE YELLOW 05/25/2021 0858   APPEARANCEUR HAZY (A) 05/25/2021 0858   LABSPEC 1.013 05/25/2021 0858   PHURINE 6.0 05/25/2021 0858   GLUCOSEU NEGATIVE 05/25/2021 0858   HGBUR SMALL (A) 05/25/2021 0858   BILIRUBINUR NEGATIVE 05/25/2021 0858   KETONESUR 20 (A) 05/25/2021 0858   PROTEINUR 30 (A) 05/25/2021 0858   NITRITE NEGATIVE 05/25/2021 0858   LEUKOCYTESUR LARGE (A) 05/25/2021 0858     Radiologic Exams on Admission:   DG Chest 2 View  Result Date: 05/25/2021 CLINICAL DATA:  Weakness. EXAM: CHEST - 2 VIEW COMPARISON:  Chest x-ray report 03/05/2016. FINDINGS: Mediastinum and hilar structures normal. Heart size normal. Low lung volumes with mild bibasilar atelectasis. Mild left base infiltrate cannot be excluded. No pleural effusion or pneumothorax. IMPRESSION: Low lung volumes with mild bibasilar atelectasis. Mild left base infiltrate cannot be excluded. Electronically Signed   By: Marcello Moores  Register   On: 05/25/2021 06:49   MR BRAIN WO CONTRAST  Result Date: 05/25/2021 CLINICAL DATA:  Delirium, prior history of DCIS left breast EXAM: MRI HEAD WITHOUT CONTRAST TECHNIQUE: Multiplanar, multiecho pulse sequences of the brain and surrounding structures were obtained without intravenous contrast. COMPARISON:  None. FINDINGS: Brain: There is a 2.7 x 2.7 cm rounded mass of the anteroinferior right frontal lobe with extensive surrounding edema. There is regional mass effect including leftward  midline shift with subfalcine herniation measuring 1 cm. No significant central herniation. No evidence of acute infarction or hemorrhage. Additional patchy T2 hyperintensity in the supratentorial and pontine white matter is nonspecific but probably reflects chronic microvascular ischemic changes. There is no extra-axial collection. There is no hydrocephalus. Vascular: Major vessel flow voids at the skull base are preserved. Skull and upper cervical spine: Normal marrow signal is preserved. Sinuses/Orbits: Minor mucosal thickening.  Orbits are unremarkable. Other: Sella is unremarkable.  Mastoid air cells are clear. IMPRESSION: 2.7 cm  anterior inferior right frontal lobe mass with extensive surrounding edema. Metastasis is the primary differential consideration. There is 1 cm of leftward midline shift. No hydrocephalus. Postcontrast imaging is recommended to evaluate for additional lesions. These results will be called to the ordering clinician or representative by the Radiologist Assistant, and communication documented in the PACS or [employer]. Electronically Signed   By: Macy Mis M.D.   On: 05/25/2021 12:27    EKG:   Independently reviewed.  Orders placed or performed during the hospital encounter of 05/25/21   ED EKG   ED EKG   EKG 12-Lead   EKG 12-Lead   EKG 12-Lead   EKG   ---------------------------------------------------------------------------------------------------------------------------------------    Assessment / Plan:   Principal Problem:   Brain mass Active Problems:   Encephalopathy   Weakness   Acute lower UTI   Dehydration   Panlobular emphysema (HCC)   Coronary artery disease involving native coronary artery of native heart without angina pectoris   AKI (acute kidney injury) (HCC)     Principal Problem:  New brain mass with edema/encephalopathy MRI of the brain revealed: 2.7 cm anterior inferior right frontal lobe mass with extensive surrounding  edema. Metastasis is the primary differential consideration. There is 1 cm of leftward midline shift. No hydrocephalus. Neurosurgery Dr. Trenton Gammon was consulted, recommended starting Decadron 10 mg x 1 then 4 mg every 6 hours -We will continue with neurochecks, aspiration precautions -Continuing IV fluids -Neurology Dr. Rory Percy consulted -Neurosurgery and neurology recommending MRI of the brain with contrast, possible CT of chest abdomen with contrast --- once kidney function improves  AKI (acute kidney injury) ([place]) -Admitted for close observation, -Baseline creatinine normal today BUN 39/creatinine 1.76 -Gentle IV fluid hydration, -Nephrotoxic  Acute respiratory failure -Tachypneic, on 4 L of oxygen (baseline home O2 demand 3 L) 98% now, she is dipping down to 70s with any exertion in bed -ABG on 4 L 7.27/68 point 6/126/20 7.2 -Continue O2 supplements, DuoNeb bronchodilators   lower UTI  -  Dehydration -gentle IV fluid hydration - Failed outpatient treatment -Antibiotic Rocephin, -Follow-up with urine cultures  Altered, confused -Patient's daughter stating she has not been the same for past 3 months mentally slow not paying her bills... Requested further work-up -No focal neurological findings at this time except for mild confusion, lethargic -We will obtain CT of brain    Active Problems: COPD/emphysema (HCC) - -As needed DuoNeb, bronchodilators, supplemental oxygen as needed -No signs of exacerbation  Coronary artery disease involving native coronary artery of native heart without angina pectoris -Resume home medication including Plavix, metoprolol, statins  Hyperlipidemia -continue home medication of simvastatin,   Weakness -PT OT, fall precaution,  Cultures:  -Urine cultures >>  Antimicrobial: -IV Rocephin  Consults called:   None -------------------------------------------------------------------------------------------------------------------------------------------- DVT prophylaxis: SCD/heparin Code Status:   Code Status: Full Code   Admission status: Patient will be admitted as Observation, with a less than 2 midnight length of stay. Level of care: Telemetry   Family Communication:  none at bedside  (The above findings and plan of care has been discussed with patient in detail, the patient expressed understanding and agreement of above plan)  --------------------------------------------------------------------------------------------------------------------------------------------------  Disposition Plan:  Anticipated 1-2 days Status is: Observation  The patient remains OBS appropriate and will d/c before 2 midnights.  Dispo: The patient is from: Home              Anticipated d/c is to: Home  Patient currently is not medically stable to d/c.   Difficult to place patient No    ---------------------------------------------------------------------------------------------------------------------------------------  Time spent: > than  66  Min.   SIGNED: Deatra James, MD, FHM. [facility],  Pager (Please use [email or web address] to page to text)  If 7PM-7AM, please contact night-coverage [email or web address],  05/25/2021, 2:34 PM

## 2021-05-26 ENCOUNTER — Observation Stay (HOSPITAL_COMMUNITY): Payer: Medicare Other

## 2021-05-26 DIAGNOSIS — R262 Difficulty in walking, not elsewhere classified: Secondary | ICD-10-CM | POA: Diagnosis not present

## 2021-05-26 DIAGNOSIS — R569 Unspecified convulsions: Secondary | ICD-10-CM | POA: Diagnosis not present

## 2021-05-26 DIAGNOSIS — F418 Other specified anxiety disorders: Secondary | ICD-10-CM | POA: Diagnosis not present

## 2021-05-26 DIAGNOSIS — Z483 Aftercare following surgery for neoplasm: Secondary | ICD-10-CM | POA: Diagnosis not present

## 2021-05-26 DIAGNOSIS — Z882 Allergy status to sulfonamides status: Secondary | ICD-10-CM | POA: Diagnosis not present

## 2021-05-26 DIAGNOSIS — J439 Emphysema, unspecified: Secondary | ICD-10-CM | POA: Diagnosis not present

## 2021-05-26 DIAGNOSIS — I672 Cerebral atherosclerosis: Secondary | ICD-10-CM | POA: Diagnosis not present

## 2021-05-26 DIAGNOSIS — D496 Neoplasm of unspecified behavior of brain: Secondary | ICD-10-CM | POA: Diagnosis not present

## 2021-05-26 DIAGNOSIS — G4700 Insomnia, unspecified: Secondary | ICD-10-CM | POA: Diagnosis present

## 2021-05-26 DIAGNOSIS — N390 Urinary tract infection, site not specified: Secondary | ICD-10-CM

## 2021-05-26 DIAGNOSIS — R627 Adult failure to thrive: Secondary | ICD-10-CM | POA: Diagnosis not present

## 2021-05-26 DIAGNOSIS — N179 Acute kidney failure, unspecified: Secondary | ICD-10-CM | POA: Diagnosis present

## 2021-05-26 DIAGNOSIS — Z853 Personal history of malignant neoplasm of breast: Secondary | ICD-10-CM | POA: Diagnosis not present

## 2021-05-26 DIAGNOSIS — R531 Weakness: Secondary | ICD-10-CM | POA: Diagnosis not present

## 2021-05-26 DIAGNOSIS — E785 Hyperlipidemia, unspecified: Secondary | ICD-10-CM | POA: Diagnosis not present

## 2021-05-26 DIAGNOSIS — Z6824 Body mass index (BMI) 24.0-24.9, adult: Secondary | ICD-10-CM | POA: Diagnosis not present

## 2021-05-26 DIAGNOSIS — J9611 Chronic respiratory failure with hypoxia: Secondary | ICD-10-CM | POA: Diagnosis not present

## 2021-05-26 DIAGNOSIS — I2510 Atherosclerotic heart disease of native coronary artery without angina pectoris: Secondary | ICD-10-CM | POA: Diagnosis present

## 2021-05-26 DIAGNOSIS — M8580 Other specified disorders of bone density and structure, unspecified site: Secondary | ICD-10-CM | POA: Diagnosis not present

## 2021-05-26 DIAGNOSIS — D432 Neoplasm of uncertain behavior of brain, unspecified: Secondary | ICD-10-CM | POA: Diagnosis not present

## 2021-05-26 DIAGNOSIS — G9340 Encephalopathy, unspecified: Secondary | ICD-10-CM | POA: Diagnosis not present

## 2021-05-26 DIAGNOSIS — C3490 Malignant neoplasm of unspecified part of unspecified bronchus or lung: Secondary | ICD-10-CM | POA: Diagnosis not present

## 2021-05-26 DIAGNOSIS — K5900 Constipation, unspecified: Secondary | ICD-10-CM | POA: Diagnosis present

## 2021-05-26 DIAGNOSIS — G9341 Metabolic encephalopathy: Secondary | ICD-10-CM | POA: Diagnosis present

## 2021-05-26 DIAGNOSIS — R41841 Cognitive communication deficit: Secondary | ICD-10-CM | POA: Diagnosis not present

## 2021-05-26 DIAGNOSIS — E440 Moderate protein-calorie malnutrition: Secondary | ICD-10-CM | POA: Diagnosis present

## 2021-05-26 DIAGNOSIS — R488 Other symbolic dysfunctions: Secondary | ICD-10-CM | POA: Diagnosis not present

## 2021-05-26 DIAGNOSIS — G9389 Other specified disorders of brain: Secondary | ICD-10-CM | POA: Diagnosis not present

## 2021-05-26 DIAGNOSIS — R0602 Shortness of breath: Secondary | ICD-10-CM | POA: Diagnosis not present

## 2021-05-26 DIAGNOSIS — R220 Localized swelling, mass and lump, head: Secondary | ICD-10-CM | POA: Diagnosis not present

## 2021-05-26 DIAGNOSIS — D431 Neoplasm of uncertain behavior of brain, infratentorial: Secondary | ICD-10-CM | POA: Diagnosis not present

## 2021-05-26 DIAGNOSIS — F411 Generalized anxiety disorder: Secondary | ICD-10-CM | POA: Diagnosis not present

## 2021-05-26 DIAGNOSIS — M6281 Muscle weakness (generalized): Secondary | ICD-10-CM | POA: Diagnosis not present

## 2021-05-26 DIAGNOSIS — D72829 Elevated white blood cell count, unspecified: Secondary | ICD-10-CM | POA: Diagnosis not present

## 2021-05-26 DIAGNOSIS — F419 Anxiety disorder, unspecified: Secondary | ICD-10-CM | POA: Diagnosis present

## 2021-05-26 DIAGNOSIS — G936 Cerebral edema: Secondary | ICD-10-CM | POA: Diagnosis present

## 2021-05-26 DIAGNOSIS — J431 Panlobular emphysema: Secondary | ICD-10-CM | POA: Diagnosis present

## 2021-05-26 DIAGNOSIS — J9622 Acute and chronic respiratory failure with hypercapnia: Secondary | ICD-10-CM | POA: Diagnosis present

## 2021-05-26 DIAGNOSIS — J9621 Acute and chronic respiratory failure with hypoxia: Secondary | ICD-10-CM | POA: Diagnosis present

## 2021-05-26 DIAGNOSIS — J9811 Atelectasis: Secondary | ICD-10-CM | POA: Diagnosis not present

## 2021-05-26 DIAGNOSIS — L304 Erythema intertrigo: Secondary | ICD-10-CM | POA: Diagnosis present

## 2021-05-26 DIAGNOSIS — R519 Headache, unspecified: Secondary | ICD-10-CM | POA: Diagnosis not present

## 2021-05-26 DIAGNOSIS — F32A Depression, unspecified: Secondary | ICD-10-CM | POA: Diagnosis present

## 2021-05-26 DIAGNOSIS — C7931 Secondary malignant neoplasm of brain: Secondary | ICD-10-CM | POA: Diagnosis present

## 2021-05-26 DIAGNOSIS — E860 Dehydration: Secondary | ICD-10-CM | POA: Diagnosis present

## 2021-05-26 DIAGNOSIS — R9431 Abnormal electrocardiogram [ECG] [EKG]: Secondary | ICD-10-CM | POA: Diagnosis not present

## 2021-05-26 DIAGNOSIS — C711 Malignant neoplasm of frontal lobe: Secondary | ICD-10-CM | POA: Diagnosis not present

## 2021-05-26 DIAGNOSIS — J449 Chronic obstructive pulmonary disease, unspecified: Secondary | ICD-10-CM | POA: Diagnosis not present

## 2021-05-26 DIAGNOSIS — J9612 Chronic respiratory failure with hypercapnia: Secondary | ICD-10-CM | POA: Diagnosis not present

## 2021-05-26 DIAGNOSIS — I10 Essential (primary) hypertension: Secondary | ICD-10-CM | POA: Diagnosis present

## 2021-05-26 DIAGNOSIS — I252 Old myocardial infarction: Secondary | ICD-10-CM | POA: Diagnosis not present

## 2021-05-26 DIAGNOSIS — Z87891 Personal history of nicotine dependence: Secondary | ICD-10-CM | POA: Diagnosis not present

## 2021-05-26 DIAGNOSIS — Z20822 Contact with and (suspected) exposure to covid-19: Secondary | ICD-10-CM | POA: Diagnosis present

## 2021-05-26 DIAGNOSIS — I959 Hypotension, unspecified: Secondary | ICD-10-CM | POA: Diagnosis not present

## 2021-05-26 DIAGNOSIS — C3412 Malignant neoplasm of upper lobe, left bronchus or lung: Secondary | ICD-10-CM | POA: Diagnosis present

## 2021-05-26 DIAGNOSIS — R2681 Unsteadiness on feet: Secondary | ICD-10-CM | POA: Diagnosis not present

## 2021-05-26 DIAGNOSIS — Z7401 Bed confinement status: Secondary | ICD-10-CM | POA: Diagnosis not present

## 2021-05-26 DIAGNOSIS — R634 Abnormal weight loss: Secondary | ICD-10-CM | POA: Diagnosis not present

## 2021-05-26 LAB — CBC
HCT: 37.7 % (ref 36.0–46.0)
Hemoglobin: 11.9 g/dL — ABNORMAL LOW (ref 12.0–15.0)
MCH: 28.7 pg (ref 26.0–34.0)
MCHC: 31.6 g/dL (ref 30.0–36.0)
MCV: 91.1 fL (ref 80.0–100.0)
Platelets: 258 10*3/uL (ref 150–400)
RBC: 4.14 MIL/uL (ref 3.87–5.11)
RDW: 12.5 % (ref 11.5–15.5)
WBC: 5.7 10*3/uL (ref 4.0–10.5)
nRBC: 0 % (ref 0.0–0.2)

## 2021-05-26 LAB — BASIC METABOLIC PANEL
Anion gap: 8 (ref 5–15)
BUN: 30 mg/dL — ABNORMAL HIGH (ref 8–23)
CO2: 33 mmol/L — ABNORMAL HIGH (ref 22–32)
Calcium: 9.6 mg/dL (ref 8.9–10.3)
Chloride: 95 mmol/L — ABNORMAL LOW (ref 98–111)
Creatinine, Ser: 1.18 mg/dL — ABNORMAL HIGH (ref 0.44–1.00)
GFR, Estimated: 48 mL/min — ABNORMAL LOW (ref >60)
Glucose, Bld: 206 mg/dL — ABNORMAL HIGH (ref 70–99)
Potassium: 4.2 mmol/L (ref 3.5–5.1)
Sodium: 136 mmol/L (ref 135–145)

## 2021-05-26 LAB — GLUCOSE, CAPILLARY: Glucose-Capillary: 120 mg/dL — ABNORMAL HIGH (ref 70–99)

## 2021-05-26 MED ORDER — LEVETIRACETAM 250 MG PO TABS
250.0000 mg | ORAL_TABLET | Freq: Two times a day (BID) | ORAL | Status: DC
  Administered 2021-05-26 – 2021-06-09 (×28): 250 mg via ORAL
  Filled 2021-05-26 (×32): qty 1

## 2021-05-26 MED ORDER — SODIUM CHLORIDE 0.9 % IV SOLN: 250.0000 mg | Freq: Two times a day (BID) | INTRAVENOUS | Status: DC

## 2021-05-26 MED ORDER — IOHEXOL 9 MG/ML PO SOLN
ORAL | Status: AC
  Filled 2021-05-26: qty 1000

## 2021-05-26 MED ORDER — SODIUM CHLORIDE 0.9 % IV SOLN: INTRAVENOUS | Status: DC

## 2021-05-26 NOTE — Procedures (Signed)
Patient Name: Barbara Gibson  MRN: [id number]  Epilepsy Attending: Lora Havens  Referring Physician/Provider: Dr Lesleigh Noe Date: 05/26/2021 Duration: 23.51 mins  Patient history: 75 year old female with history of breast cancer now with brain mets presented with weakness.  Patient's daughter also reported episodes of left side shaking followed by weakness on the left side.  EEG to evaluate for seizures.  Level of alertness: Awake  AEDs during EEG study: Keppra  Technical aspects: This EEG study was done with scalp electrodes positioned according to the 10-20 International system of electrode placement. Electrical activity was acquired at a sampling rate of 500Hz  and reviewed with a high frequency filter of 70Hz  and a low frequency filter of 1Hz . EEG data were recorded continuously and digitally stored.   Description: The posterior dominant rhythm consists of 8-9 Hz activity of moderate voltage (25-35 uV) seen predominantly in posterior head regions, symmetric and reactive to eye opening and eye closing. EEG showed continuous rhythmic 2 to 3 Hz sharply contoured delta slowing in her right frontal region. Hyperventilation and photic stimulation were not performed.     ABNORMALITY -Continuous rhythmic delta slow, right frontal region  IMPRESSION: This study is suggestive of cortical dysfunction in the right frontal region likely secondary to underlying mass.  Additionally, rhythmic delta slowing in frontal region is on the ictal-interictal continuum with low potential for seizures.  No seizures or definite epileptiform discharges were seen throughout the recording.  Juleen Sorrels Barbra Sarks

## 2021-05-26 NOTE — Consult Note (Signed)
Neurology Consultation Reason for Consult: Brain lesion  Requesting Physician: Skipper Cliche  CC: generalized weakness   History is obtained from: Patient and chart review and daughter Arby Barrette at beside  HPI: Barbara Gibson is a 75 y.o. 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 difficile colitis with deferment of radiation therapy due to cardiopulmonary and breast issues; COPD, hyperlipidemia, coronary artery disease s/p MI, prior smoking (78 pack years, quit 8 years ago), anxiety/depression.  Patient is fairly tired on my evaluation and is able to confirm some key details but most of the history was provided by the daughter.  Daughter reports she has been concerned about her mother's memory and initially thought that the patient's symptoms were secondary to depression in the setting of illness and eventually death in a close family member in February/March.  However the patient has been having increasing difficulty with executive function such as paying her bills and taking her medications.  Daughter also feels that in general the patient has been trying to hide the symptoms from her, for example patient tells me she has been having a headache which daughter reports was unknown to her.  Patient reports this headache is worse at the end of the day and that she also has blurry vision that is worse at night and has been ongoing for about a month but been overall stable in that timeframe.  Initially patient and daughter reported that the patient has had some full body trembling, but then on further history they reported her left side will shake for 5 to 10 minutes in particular, and this is nonsuppressible and associated with weakness of the left side afterwards that gradually recovers over time.   She does remain interactive during these episodes.  While she lives with a son, patient and daughter report he is frequently not home.  MRI brain was obtained for her cognitive symptoms, which revealed a brain mass for which neurology and neurosurgery have been consulted  ROS: All other review of systems was negative except as noted in the HPI, though caveat of delirium / confusion secondary to her brain mass and seizures   Past Medical History:  Diagnosis Date   Anxiety    COPD (chronic obstructive pulmonary disease) ([place])    Depression    High cholesterol    Hyperlipidemia    Myocardial infarction ([place]) 10/22/2014   "mild"   On home oxygen therapy    "2L; 24/7" (10/24/2014)   Pneumonia 04/2013   Past Surgical History:  Procedure Laterality Date   CARDIAC CATHETERIZATION  10/24/2014   LEFT HEART CATHETERIZATION WITH CORONARY ANGIOGRAM N/A 10/24/2014   Procedure: LEFT HEART CATHETERIZATION WITH CORONARY ANGIOGRAM;  Surgeon: Laverda Page, MD;  Location: [facility] CATH LAB;  Service: Cardiovascular;  Laterality: N/A;   TUBAL LIGATION  1980's   Current Outpatient Medications  Medication Instructions   acetaZOLAMIDE (DIAMOX) 250 mg, Oral, 2 times daily   ALPRAZolam (XANAX) 1 mg, Oral, 2 times daily   ANORO ELLIPTA 62.5-25 MCG/INH AEPB 1 puff, Inhalation, Daily   benazepril (LOTENSIN) 5 mg, Oral, Daily   buPROPion (WELLBUTRIN XL) 150 mg, Oral, Daily   Cholecalciferol 50 MCG (2000 UT) CAPS 1 capsule, Oral, Daily   citalopram (CELEXA) 20 mg, Oral, Daily   clopidogrel (PLAVIX) 75 mg, Oral, Daily   metoprolol tartrate (LOPRESSOR) 12.5 mg, Oral, 2  times daily   Multiple Vitamin (MULTIVITAMIN WITH MINERALS) TABS tablet 1 tablet, Oral, Daily   Probiotic Product (PROBIOTIC PO) 1 tablet, Oral, 2 times daily   simvastatin (ZOCOR) 20 mg, Oral, Daily   Meds ordered this encounter  Medications   lactated ringers bolus 1,000 mL   sodium chloride flush (NS) 0.9 % injection 3 mL   0.9 %  sodium  chloride infusion   sodium chloride flush (NS) 0.9 % injection 3 mL   sodium chloride flush (NS) 0.9 % injection 3 mL   0.9 %  sodium chloride infusion   OR Linked Order Group    acetaminophen (TYLENOL) tablet 650 mg    acetaminophen (TYLENOL) suppository 650 mg   oxyCODONE (Oxy IR/ROXICODONE) immediate release tablet 5 mg   HYDROmorphone (DILAUDID) injection 0.5-1 mg   traZODone (DESYREL) tablet 25 mg   senna-docusate (Senokot-S) tablet 1 tablet   bisacodyl (DULCOLAX) EC tablet 5 mg   magnesium citrate solution 1 Bottle   OR Linked Order Group    ondansetron (ZOFRAN) tablet 4 mg    ondansetron (ZOFRAN) injection 4 mg   ipratropium (ATROVENT) nebulizer solution 0.5 mg   levalbuterol (XOPENEX) nebulizer solution 0.63 mg   hydrALAZINE (APRESOLINE) injection 10 mg   heparin injection 5,000 Units   DISCONTD: lactated ringers bolus 500 mL   metoprolol tartrate (LOPRESSOR) tablet 12.5 mg   simvastatin (ZOCOR) tablet 20 mg   citalopram (CELEXA) tablet 20 mg   clopidogrel (PLAVIX) tablet 75 mg   multivitamin with minerals tablet 1 tablet   LORazepam (ATIVAN) injection 0.5 mg   cefTRIAXone (ROCEPHIN) 1 g in sodium chloride 0.9 % 100 mL IVPB    Order Specific Question:   Antibiotic Indication:    Answer:   UTI   dexamethasone (DECADRON) injection 10 mg   dexamethasone (DECADRON) injection 4 mg   gadobutrol (GADAVIST) 1 MMOL/ML injection 7.5 mL     History reviewed. No pertinent family history. No prior family history of seizures  Social History:  reports that she quit smoking about 8 years ago. Her smoking use included cigarettes. She has a 78.00 pack-year smoking history. She has never used smokeless tobacco. She reports that she does not drink alcohol and does not use drugs.  Exam: Current vital signs: BP (!) 134/54 (BP Location: Right Arm)   Pulse 67   Temp 97.7 F (36.5 C) (Axillary)   Resp 18   SpO2 100%  Vital signs in last 24 hours: Temp:  [97.6 F (36.4 C)-98.5 F  (36.9 C)] 97.7 F (36.5 C) (06/29 0109) Pulse Rate:  [67-83] 67 (06/29 0109) Resp:  [16-30] 18 (06/29 0109) BP: (124-154)/(44-81) 134/54 (06/29 0109) SpO2:  [76 %-100 %] 100 % (06/29 0109)   Physical Exam  Constitutional: Appears well-developed and well-nourished.  Psych: Affect appropriate to situation, calm and cooperative Eyes: No scleral injection HENT: No oropharyngeal obstruction.  Fair dentition MSK: no joint deformities.  Cardiovascular: Normal rate and regular rhythm.  Respiratory: Effort normal, non-labored breathing GI: Soft.  No distension. There is no tenderness.  Skin: Warm dry and intact visible skin, some swelling of the right upper extremity near an old IV site  Neuro: Mental Status: Patient is awake, alert, oriented to person, place, month, and situation, though she initially reports that she is 75 years old instead of 54 Patient is able to give limited history partially due to falling asleep No signs of aphasia or neglect Cranial Nerves: II: Visual Fields are full. Pupils are equal, round,  and reactive to light.  III,IV, VI: EOMI without ptosis, though she does report some mild blurring of her vision on left gaze.  V: Facial sensation is symmetric to temperature VII: Facial movement is symmetric.  VIII: hearing is intact to voice X: Uvula elevates symmetrically XI: Shoulder shrug is symmetric. XII: tongue is midline without atrophy or fasciculations.  Motor: Tone is normal. Bulk is normal. 5/5 strength was present throughout except eft-sided deltoid weakness 4/5, 4+/5 triceps and biceps, 4 -/5 hip flexion on the left, 4/5 hip flexion on the right, 4/5 knee flexion and extension on the left Sensory: Sensation is symmetric to light touch and temperature in the arms and legs. Deep Tendon Reflexes: 3+ and symmetric in the biceps and 2+ and symmetric in the patellae.  Cerebellar: FNF and HKS are intact bilaterally   I have reviewed labs in epic and the  results pertinent to this consultation are:  Creatinine 1.76 from 0.67 in 2018 (GFR 30)  CrCl cannot be calculated (Unknown ideal weight.).  UA with large leukocytes, negative nitrates, rare bacteria CBC within normal limits   I have reviewed the images obtained: MRI brain with and without contrast with personally reviewed and reviewed with family at bedside Agree with radiology read: 1. Right frontal mass measuring 3.2 x 2.6 cm, with large amount of surrounding edema and 8 mm leftward mid  Additionally CT chest/abdomen/pelvis revealing a pulmonary nodule of the left side  Impression: This is a 75 year old woman with past medical history significant for breast cancer now with brain mass concerning for metastasis and lung nodule concerning for neoplasm.  History family and patient provider also very concerning for focal seizures which would not be unexpected given the very cortical location of the lesion and the significant surrounding edema.  Will obtain EEG for further work-up and start Keppra due to high clinical index of suspicion for focal seizure activity  Recommendations:  # Brain mass with focal seizures - Appreciate neurosurgery, oncology and primary team's assessment for primary and assistance in management - Agree with dexamethasone, taper per neurosurgery over time - Routine EEG to complete work-up - Keppra 250 mg twice daily, may increase if needed for seizure control pending improvement in renal function as well -Please see seizure precautions below to include in discharge instructions when patient is ready to leave -Neurology will follow up EEG but otherwise will be available on an as-needed basis going forward, please reach out if any questions arise  Standard seizure precautions: Per [facility] statutes, patients with seizures are not allowed to drive until  they have been seizure-free for six months. Use caution when using heavy equipment or power tools. Avoid  working on ladders or at heights. Take showers instead of baths. Ensure the water temperature is not too high on the home water heater. Do not go swimming alone. When caring for infants or small children, sit down when holding, feeding, or changing them to minimize risk of injury to the child in the event you have a seizure.  To reduce risk of seizures, maintain good sleep hygiene avoid alcohol and illicit drug use, take all anti-seizure medications as prescribed.    Lesleigh Noe MD-PhD [facility] [phone]

## 2021-05-26 NOTE — Progress Notes (Signed)
Same-day chart review note EEG unremarkable for seizures but has rhythmic delta slowing in the right frontal region. Given the underlying structural abnormality, reasonable to continue Keppra 250 twice daily for now. If she has breakthrough seizure activity, can increase to Keppra 500 twice daily. Please feel free to recall at the time. Neurosurgery on board-patient will be scheduled for resection of the mass-please see neurosurgical consultation note from today.  Continue dexamethasone. Inpatient neurology will be available as needed. Plan communicated to Dr. Waldron Labs via secure chat.  -- Amie Portland, MD Neurologist [facility] Pager: [phone]

## 2021-05-26 NOTE — Evaluation (Signed)
Physical Therapy Evaluation Patient Details Name: Barbara Gibson MRN: [id number] DOB: 28-Nov-1946 Today's Date: 05/26/2021   History of Present Illness  75 y/o female presented to [facility] ED on 6/28 with generalized weakness x 10 days, difficulty with executive functioning, and headache. T/f to [facility]. 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 PMH: COPD, anxiety, depression, HLD, MI in 2015  Clinical Impression  PTA, patient essentially lived alone and reports ambulating without AD but requires assistance for ADLs over the last month. Patient currently requiring modA for mobility with HHAx1. Patient presents with generalized weakness, impaired balance, decreased activity tolerance, decreased safety awareness, and impaired cognition. Patient requiring increased time to process orientation questions as well as commands. At times, answering inappropriately. Patient will benefit from skilled PT services during acute stay to address listed deficits. Recommend SNF following discharge to maximize functional mobility and safety prior to returning home.     Follow Up Recommendations SNF;Supervision/Assistance - 24 hour    Equipment Recommendations  Rolling Regana Kemple with 5" wheels;3in1 (PT)    Recommendations for Other Services       Precautions / Restrictions Precautions Precautions: Fall Precaution Comments: 4L O2 @ baseline Restrictions Weight Bearing Restrictions: No      Mobility  Bed Mobility Overal bed mobility: Needs Assistance Bed Mobility: Supine to Sit;Sit to Supine     Supine to sit: Min assist Sit to supine: Min assist   General bed mobility comments: minA for trunk elevation and initiation of movement.    Transfers Overall transfer level: Needs assistance Equipment used: Rolling Darey Hershberger (2 wheeled);1 person hand held assist Transfers: Sit to/from Stand Sit to Stand: Mod assist Stand pivot transfers:  Mod assist       General transfer comment: trunk flexed position  Ambulation/Gait Ambulation/Gait assistance: Mod assist Gait Distance (Feet): 10 Feet (x10') Assistive device: Rolling Edelmiro Innocent (2 wheeled);1 person hand held assist Gait Pattern/deviations: Step-to pattern;Decreased stride length;Shuffle;Trunk flexed;Wide base of support Gait velocity: decreased   General Gait Details: ModA for balance and RW management. Improved balance with HHAx1 for last 10'  Stairs            Wheelchair Mobility    Modified Rankin (Stroke Patients Only)       Balance Overall balance assessment: Needs assistance Sitting-balance support: Feet supported Sitting balance-Leahy Scale: Fair Sitting balance - Comments: trunk flexed and kyphotic posture in sitting Postural control: Left lateral lean Standing balance support: Bilateral upper extremity supported Standing balance-Leahy Scale: Poor Standing balance comment: needing external assist                             Pertinent Vitals/Pain Pain Assessment: No/denies pain    Home Living Family/patient expects to be discharged to:: Private residence Living Arrangements: Children Available Help at Discharge: Family;Available 24 hours/day;Other (Comment) (Son lives with her, but is generally not around.  Daughter's can provide 24 hour assist.) Type of Home: House Home Access: Stairs to enter Entrance Stairs-Rails: None Entrance Stairs-Number of Steps: 2 Home Layout: One level Home Equipment: Bedside commode;Shower seat      Prior Function Level of Independence: Needs assistance   Gait / Transfers Assistance Needed: Walking without ues of an AD per daughter  ADL's / Homemaking Assistance Needed: Needing increasing assist over the last month per daughter for: bathing/dressing/home management, and bill payment.  Comments: daughter reports fall x 1 in  last 2-3 weeks     Hand Dominance   Dominant Hand: Right     Extremity/Trunk Assessment   Upper Extremity Assessment Upper Extremity Assessment: Defer to OT evaluation    Lower Extremity Assessment Lower Extremity Assessment: Generalized weakness (4 beats of clonus bilaterally)    Cervical / Trunk Assessment Cervical / Trunk Assessment: Kyphotic  Communication   Communication: No difficulties  Cognition Arousal/Alertness: Awake/alert Behavior During Therapy: Flat affect Overall Cognitive Status: Impaired/Different from baseline Area of Impairment: Orientation;Attention;Following commands;Safety/judgement;Problem solving;Memory                   Current Attention Level: Sustained Memory: Decreased short-term memory Following Commands: Follows one step commands with increased time Safety/Judgement: Decreased awareness of safety   Problem Solving: Slow processing;Decreased initiation;Requires verbal cues;Requires tactile cues General Comments: Needing increased time for orientation questions. Decreased awareness of safety with leaving RW behind with mobility      General Comments General comments (skin integrity, edema, etc.): VSS on 4L O2 [place]    Exercises     Assessment/Plan    PT Assessment Patient needs continued PT services  PT Problem List Decreased strength;Decreased activity tolerance;Decreased balance;Decreased mobility;Decreased cognition;Decreased knowledge of use of DME;Decreased safety awareness;Decreased knowledge of precautions;Cardiopulmonary status limiting activity       PT Treatment Interventions DME instruction;Gait training;Functional mobility training;Stair training;Therapeutic activities;Therapeutic exercise;Balance training;Patient/family education    PT Goals (Current goals can be found in the Care Plan section)  Acute Rehab PT Goals Patient Stated Goal: Hoping to go home PT Goal Formulation: With patient Time For Goal Achievement: 06/09/21 Potential to Achieve Goals: Fair    Frequency Min  2X/week   Barriers to discharge        Co-evaluation PT/OT/SLP Co-Evaluation/Treatment: Yes Reason for Co-Treatment: For patient/therapist safety;To address functional/ADL transfers PT goals addressed during session: Mobility/safety with mobility;Balance;Proper use of DME OT goals addressed during session: ADL's and self-care       AM-PAC PT "6 Clicks" Mobility  Outcome Measure Help needed turning from your back to your side while in a flat bed without using bedrails?: A Lot Help needed moving from lying on your back to sitting on the side of a flat bed without using bedrails?: A Lot Help needed moving to and from a bed to a chair (including a wheelchair)?: A Lot Help needed standing up from a chair using your arms (e.g., wheelchair or bedside chair)?: A Lot Help needed to walk in hospital room?: A Lot Help needed climbing 3-5 steps with a railing? : Total 6 Click Score: 11    End of Session Equipment Utilized During Treatment: Gait belt Activity Tolerance: Patient limited by fatigue Patient left: in bed;with call bell/phone within reach;with bed alarm set Nurse Communication: Mobility status PT Visit Diagnosis: Unsteadiness on feet (R26.81);Muscle weakness (generalized) (M62.81);History of falling (Z91.81);Difficulty in walking, not elsewhere classified (R26.2)    Time: 2025-4270 PT Time Calculation (min) (ACUTE ONLY): 33 min   Charges:   PT Evaluation $PT Eval Moderate Complexity: 1 Mod          Elianah Karis A. Gilford Rile PT, DPT Acute Rehabilitation Services Pager [phone] Office [phone]   Linna Hoff 05/26/2021, 12:49 PM

## 2021-05-26 NOTE — Progress Notes (Signed)
PROGRESS NOTE    Barbara Gibson  BHA:[id number] DOB: May 20, 1946 DOA: 05/25/2021 PCP: [name], MD    Chief Complaint  Patient presents with   Weakness    Brief Narrative:   Barbara Gibson is a 75 y.o. female with medical history significant of anxiety, COPD, depression, hyperlipidemia, CAD, history of MI, former smoker, history of recent UTI.  History of breast cancer status post lumpectomy, few years ago no radiation or chemo given her poor general health presenting with generalized weaknesses x3 days.  Her work-up significant for brain mass, likely meningioma per neurosurgery.  Assessment & Plan:   Principal Problem:   Brain mass Active Problems:   Panlobular emphysema (HCC)   Coronary artery disease involving native coronary artery of native heart without angina pectoris   AKI (acute kidney injury) ([place])   Weakness   Acute lower UTI   Dehydration   Encephalopathy   New brain mass with edema/encephalopathy MRI of the brain revealed 2.7 cm anterior inferior right frontal lobe mass with extensive surrounding edema.  -Neurosurgery input greatly appreciated, findings concerning for meningioma, plan for surgical resection, timing per neurosurgery, continue with IV Decadron given evidence of edema and midline shift. -Neurology input greatly appreciated, EEG unremarkable for seizures, but has rhythmic delta slowing in the right frontal region, for now continue with Keppra 250 mg p.o. twice daily, if she has any breakthrough seizures activity, then it can be increased to 500 mg p.o. twice daily -Lesion most likely meningioma, but given her history of breast cancer, will need to rule out malignancy, will proceed with CT chest/abdomen/pelvis once her creatinine has improved.   AKI (acute kidney injury) ([place]) -Continue with IV fluids, avoid nephrotoxic medications.  Likely will need to receive IV contrast, so we will continue with IV fluid, will increase to 75 cc/h.   Chronic  hypoxic respiratory failure - At baseline, on 4 L nasal cannula   lower UTI  -Continue with Rocephin, follow on urine cultures   Altered, confused due to acute metabolic encephalopathy  - in the setting of brain mass and UTI -Improving with IV steroids and antibiotics, daughter at bedside reports she is not back to her baseline    COPD/emphysema ([place]) - -As needed DuoNeb, bronchodilators, supplemental oxygen as needed -No signs of exacerbation   Coronary artery disease involving native coronary artery of native heart without angina pectoris -Resume home medication including metoprolol, statins, will hold Plavix in case she needs neurosurgery   Hyperlipidemia -continue home medication of simvastatin,     Weakness -PT OT, fall precaution  DVT prophylaxis: [place] heaprin Code Status: Full Family Communication: daughter at bedside Disposition:   Status is: Observation  The patient will require care spanning > 2 midnights and should be moved to inpatient because: IV treatments appropriate due to intensity of illness or inability to take PO  Dispo: The patient is from: Home              Anticipated d/c is to: Home              Patient currently is not medically stable to d/c.   Difficult to place patient No       Consultants:  Neurosurgery  Subjective: Patient reports she is feeling better, but still reports she is with poor appetite, still has generalized weakness and fatigue, daughter at bedside reports she is not at baseline.  Objective: Vitals:   05/26/21 0109 05/26/21 0415 05/26/21 0746 05/26/21 1125  BP: (!) 134/54 Marland Kitchen)  162/68 (!) 142/59 134/67  Pulse: 67 77 82 77  Resp: 18 (!) 22 20 20   Temp: 97.7 F (36.5 C) 97.6 F (36.4 C) 98.2 F (36.8 C) 97.9 F (36.6 C)  TempSrc: Axillary Oral Oral Oral  SpO2: 100% 98%  100%    Intake/Output Summary (Last 24 hours) at 05/26/2021 1153 Last data filed at 05/26/2021 0800 Gross per 24 hour  Intake 1215.28 ml  Output 1 ml   Net 1214.28 ml   There were no vitals filed for this visit.  Examination:  General exam: Appears calm and comfortable  Respiratory system: Clear to auscultation. Respiratory effort normal. Cardiovascular system: S1 & S2 heard, RRR. No JVD, murmurs, rubs, gallops or clicks. No pedal edema. Gastrointestinal system: Abdomen is nondistended, soft and nontender. No organomegaly or masses felt. Normal bowel sounds heard. Central nervous system: Alert and oriented. No focal neurological deficits. Extremities: Symmetric 5 x 5 power. Skin: No rashes, lesions or ulcers Psychiatry: She is pleasant, communicative, mildly confused.      Data Reviewed: I have personally reviewed following labs and imaging studies  CBC: Recent Labs  Lab 05/25/21 0603  WBC 8.8  NEUTROABS 7.4  HGB 13.7  HCT 44.2  MCV 95.5  PLT 751    Basic Metabolic Panel: Recent Labs  Lab 05/25/21 0603  NA 135  K 4.4  CL 95*  CO2 35*  GLUCOSE 122*  BUN 39*  CREATININE 1.76*  CALCIUM 9.2    GFR: CrCl cannot be calculated (Unknown ideal weight.).  Liver Function Tests: Recent Labs  Lab 05/25/21 0603  AST 34  ALT 14  ALKPHOS 56  BILITOT 0.7  PROT 7.3  ALBUMIN 4.2    CBG: Recent Labs  Lab 05/25/21 0815 05/26/21 0755  GLUCAP 101* 120*     Recent Results (from the past 240 hour(s))  Resp Panel by RT-PCR (Flu A&B, Covid) Nasopharyngeal Swab     Status: None   Collection Time: 05/25/21  7:13 AM   Specimen: Nasopharyngeal Swab; Nasopharyngeal(NP) swabs in vial transport medium  Result Value Ref Range Status   SARS Coronavirus 2 by RT PCR NEGATIVE NEGATIVE Final    Comment: (NOTE) SARS-CoV-2 target nucleic acids are NOT DETECTED.  The SARS-CoV-2 RNA is generally detectable in upper respiratory specimens during the acute phase of infection. The lowest concentration of SARS-CoV-2 viral copies this assay can detect is 138 copies/mL. A negative result does not preclude SARS-Cov-2 infection and  should not be used as the sole basis for treatment or other patient management decisions. A negative result may occur with  improper specimen collection/handling, submission of specimen other than nasopharyngeal swab, presence of viral mutation(s) within the areas targeted by this assay, and inadequate number of viral copies(<138 copies/mL). A negative result must be combined with clinical observations, patient history, and epidemiological information. The expected result is Negative.  Fact Sheet for Patients:  EntrepreneurPulse.com.au  Fact Sheet for Healthcare Providers:  IncredibleEmployment.be  This test is no t yet approved or cleared by the [place] FDA and  has been authorized for detection and/or diagnosis of SARS-CoV-2 by FDA under an Emergency Use Authorization (EUA). This EUA will remain  in effect (meaning this test can be used) for the duration of the COVID-19 declaration under Section 564(b)(1) of the Act, 21 U.S.C.section 360bbb-3(b)(1), unless the authorization is terminated  or revoked sooner.       Influenza A by PCR NEGATIVE NEGATIVE Final   Influenza B by PCR NEGATIVE NEGATIVE Final  Comment: (NOTE) The Xpert Xpress SARS-CoV-2/FLU/RSV plus assay is intended as an aid in the diagnosis of influenza from Nasopharyngeal swab specimens and should not be used as a sole basis for treatment. Nasal washings and aspirates are unacceptable for Xpert Xpress SARS-CoV-2/FLU/RSV testing.  Fact Sheet for Patients: EntrepreneurPulse.com.au  Fact Sheet for Healthcare Providers: IncredibleEmployment.be  This test is not yet approved or cleared by the [place] FDA and has been authorized for detection and/or diagnosis of SARS-CoV-2 by FDA under an Emergency Use Authorization (EUA). This EUA will remain in effect (meaning this test can be used) for the duration of the COVID-19 declaration  under Section 564(b)(1) of the Act, 21 U.S.C. section 360bbb-3(b)(1), unless the authorization is terminated or revoked.  Performed at [facility], [address], [place], [place] [address]          Radiology Studies: DG Chest 2 View  Result Date: 05/25/2021 CLINICAL DATA:  Weakness. EXAM: CHEST - 2 VIEW COMPARISON:  Chest x-ray report 03/05/2016. FINDINGS: Mediastinum and hilar structures normal. Heart size normal. Low lung volumes with mild bibasilar atelectasis. Mild left base infiltrate cannot be excluded. No pleural effusion or pneumothorax. IMPRESSION: Low lung volumes with mild bibasilar atelectasis. Mild left base infiltrate cannot be excluded. Electronically Signed   By: Marcello Moores  Register   On: 05/25/2021 06:49   MR BRAIN WO CONTRAST  Result Date: 05/25/2021 CLINICAL DATA:  Delirium, prior history of DCIS left breast EXAM: MRI HEAD WITHOUT CONTRAST TECHNIQUE: Multiplanar, multiecho pulse sequences of the brain and surrounding structures were obtained without intravenous contrast. COMPARISON:  None. FINDINGS: Brain: There is a 2.7 x 2.7 cm rounded mass of the anteroinferior right frontal lobe with extensive surrounding edema. There is regional mass effect including leftward midline shift with subfalcine herniation measuring 1 cm. No significant central herniation. No evidence of acute infarction or hemorrhage. Additional patchy T2 hyperintensity in the supratentorial and pontine white matter is nonspecific but probably reflects chronic microvascular ischemic changes. There is no extra-axial collection. There is no hydrocephalus. Vascular: Major vessel flow voids at the skull base are preserved. Skull and upper cervical spine: Normal marrow signal is preserved. Sinuses/Orbits: Minor mucosal thickening.  Orbits are unremarkable. Other: Sella is unremarkable.  Mastoid air cells are clear. IMPRESSION: 2.7 cm anterior inferior right frontal lobe mass with extensive surrounding edema. Metastasis  is the primary differential consideration. There is 1 cm of leftward midline shift. No hydrocephalus. Postcontrast imaging is recommended to evaluate for additional lesions. These results will be called to the ordering clinician or representative by the Radiologist Assistant, and communication documented in the PACS or [employer]. Electronically Signed   By: Macy Mis M.D.   On: 05/25/2021 12:27   MR BRAIN W WO CONTRAST  Result Date: 05/26/2021 CLINICAL DATA:  Intracranial metastatic disease. Generalized weakness. EXAM: MRI HEAD WITHOUT AND WITH CONTRAST TECHNIQUE: Multiplanar, multiecho pulse sequences of the brain and surrounding structures were obtained without and with intravenous contrast. CONTRAST:  7.8mL GADAVIST GADOBUTROL 1 MMOL/ML IV SOLN COMPARISON:  Brain MRI 05/25/2021 at 11:06 a.m. FINDINGS: Brain: Unchanged appearance of right frontal lesion with large amount of surrounding edema. Unchanged leftward midline shift of 8 mm. Right frontal mass shows heterogeneous contrast enhancement and measures 3.2 x 2.6 cm. There are no other contrast-enhancing lesions. There is no acute or chronic hemorrhage. Normal white matter signal, parenchymal volume and CSF spaces. The midline structures are normal. Vascular: Major flow voids are preserved. Skull and upper cervical spine: Normal calvarium and  skull base. Visualized upper cervical spine and soft tissues are normal. Sinuses/Orbits:No paranasal sinus fluid levels or advanced mucosal thickening. No mastoid or middle ear effusion. Normal orbits. IMPRESSION: 1. Right frontal mass measuring 3.2 x 2.6 cm, with large amount of surrounding edema and 8 mm leftward midline shift. 2. No other metastatic lesions. Electronically Signed   By: Ulyses Jarred M.D.   On: 05/26/2021 00:04   EEG adult  Result Date: 05/26/2021 Lora Havens, MD     05/26/2021 10:06 AM Patient Name: Barbara Gibson MRN: [id number] Epilepsy Attending: Lora Havens Referring  Physician/Provider: Dr Lesleigh Noe Date: 05/26/2021 Duration: 23.51 mins Patient history: 75 year old female with history of breast cancer now with brain mets presented with weakness.  Patient's daughter also reported episodes of left side shaking followed by weakness on the left side.  EEG to evaluate for seizures. Level of alertness: Awake AEDs during EEG study: Keppra Technical aspects: This EEG study was done with scalp electrodes positioned according to the 10-20 International system of electrode placement. Electrical activity was acquired at a sampling rate of 500Hz  and reviewed with a high frequency filter of 70Hz  and a low frequency filter of 1Hz . EEG data were recorded continuously and digitally stored. Description: The posterior dominant rhythm consists of 8-9 Hz activity of moderate voltage (25-35 uV) seen predominantly in posterior head regions, symmetric and reactive to eye opening and eye closing. EEG showed continuous rhythmic 2 to 3 Hz sharply contoured delta slowing in her right frontal region. Hyperventilation and photic stimulation were not performed.   ABNORMALITY -Continuous rhythmic delta slow, right frontal region IMPRESSION: This study is suggestive of cortical dysfunction in the right frontal region likely secondary to underlying mass.  Additionally, rhythmic delta slowing in frontal region is on the ictal-interictal continuum with low potential for seizures.  No seizures or definite epileptiform discharges were seen throughout the recording. Priyanka Barbra Sarks   CT CHEST ABDOMEN PELVIS WO CONTRAST  Result Date: 05/25/2021 CLINICAL DATA:  75 year old female with evaluation for metastatic disease. EXAM: CT CHEST, ABDOMEN AND PELVIS WITHOUT CONTRAST TECHNIQUE: Multidetector CT imaging of the chest, abdomen and pelvis was performed following the standard protocol without IV contrast. COMPARISON:  None. FINDINGS: Evaluation of this exam is limited in the absence of intravenous contrast. CT  CHEST FINDINGS Cardiovascular: There is no cardiomegaly or pericardial effusion. Three-vessel coronary vascular calcification. Advanced atherosclerotic calcification of the thoracic aorta. No aneurysmal dilatation. The central pulmonary arteries are grossly unremarkable. Mediastinum/Nodes: No hilar or mediastinal adenopathy. The esophagus is grossly unremarkable. Enlarged thyroid gland with multiple nodules, likely multinodular goiter. No mediastinal fluid collection. Lungs/Pleura: Background of emphysema. There is a 1.7 x 1.0 cm left upper lobe subpleural nodule with irregular margins suspicious for a neoplastic process (32/7). Correlation with history of known malignancy and/or multidisciplinary consult and further evaluation with PET-CT is recommended. There is a 7 mm nodule with irregular margins in the left upper lobe (21/7). Apparent small nodular densities in the right upper lobe likely confluence of interstitial markings. There is however a 3 mm right upper lobe nodule (30/7). No lobar consolidation, pleural effusion, or pneumothorax. The central airways are patent. Musculoskeletal: Old healed right posterior rib fractures. No acute osseous pathology. Osteopenia with degenerative changes of the spine. CT ABDOMEN PELVIS FINDINGS No intra-abdominal free air or free fluid. Hepatobiliary: The liver is unremarkable. No intrahepatic biliary dilatation. The gallbladder is unremarkable. Pancreas: Unremarkable. No pancreatic ductal dilatation or surrounding inflammatory changes. Spleen: Normal in size without focal  abnormality. Adrenals/Urinary Tract: The adrenal glands are unremarkable. There is a 2 mm nonobstructing right renal upper pole calculus. No hydronephrosis. The left kidney is unremarkable. The visualized ureters and urinary bladder are unremarkable. Stomach/Bowel: There is severe sigmoid diverticulosis without active inflammatory changes. There is no bowel obstruction or active inflammation. The appendix  is normal. Vascular/Lymphatic: Advanced aortoiliac atherosclerotic disease. The IVC is unremarkable. No portal venous gas. There is no adenopathy. Reproductive: The uterus and ovaries are grossly unremarkable. No adnexal masses. Other: None Musculoskeletal: Osteopenia with degenerative changes of the spine. No acute osseous pathology. IMPRESSION: 1. Left upper lobe 1.7 x 1.0 cm subpleural nodule suspicious for a neoplastic process. Additional smaller pulmonary nodules as above. Correlation with history of known malignancy or multidisciplinary consult and further evaluation with PET-CT is recommended. 2. No evidence of metastatic disease in the abdomen and pelvis. 3. Severe sigmoid diverticulosis. No bowel obstruction. Normal appendix. 4. Multinodular goiter. 5. Aortic Atherosclerosis (ICD10-I70.0) and Emphysema (ICD10-J43.9). Electronically Signed   By: Anner Crete M.D.   On: 05/25/2021 23:53        Scheduled Meds:  citalopram  20 mg Oral Daily   dexamethasone (DECADRON) injection  4 mg Intravenous Q6H   heparin  5,000 Units Subcutaneous Q8H   levETIRAcetam  250 mg Oral BID   metoprolol tartrate  12.5 mg Oral BID   multivitamin with minerals  1 tablet Oral Daily   simvastatin  20 mg Oral q1800   sodium chloride flush  3 mL Intravenous Q12H   sodium chloride flush  3 mL Intravenous Q12H   Continuous Infusions:  sodium chloride     cefTRIAXone (ROCEPHIN)  IV 1 g (05/25/21 1242)     LOS: 0 days      Phillips Climes, MD [facility]   To contact the attending provider between 7A-7P or the covering provider during after hours 7P-7A, please log into the web site [email or web address] and access using universal [place] password for that web site. If you do not have the password, please call the hospital operator.  05/26/2021, 11:53 AM

## 2021-05-26 NOTE — Evaluation (Signed)
Occupational Therapy Evaluation Patient Details Name: Barbara Gibson MRN: [id number] DOB: Mar 29, 1946 Today's Date: 05/26/2021    History of Present Illness 75 year old female admitted for evaluation of newly discovered right frontal mass.  PMH includes: Anxiety, COPD, Depression, Hyperlipidemia, Myocardial infarction.   Clinical Impression   Patient admitted for the diagnosis above.  She is currently being worked up for surgery.  At home, she lives with her son, but he is generally not there.  Her daughter admits to declining functional independence with ADL and IADL completion.  Barriers are listed below, and she is needing up to Mod A for basic mobility and ADL completion currently.  OT to continue to follow in the acute setting, SNF is recommended post acute, but further refinement may be needed depending on surgical outcome.      Follow Up Recommendations  SNF    Equipment Recommendations  Tub/shower seat    Recommendations for Other Services       Precautions / Restrictions Precautions Precautions: Fall Restrictions Weight Bearing Restrictions: No      Mobility Bed Mobility Overal bed mobility: Needs Assistance Bed Mobility: Supine to Sit;Sit to Supine     Supine to sit: Min assist Sit to supine: Min assist     Patient Response: Flat affect  Transfers Overall transfer level: Needs assistance Equipment used: Rolling walker (2 wheeled);1 person hand held assist Transfers: Sit to/from [facility] Sit to Stand: Mod assist Stand pivot transfers: Mod assist       General transfer comment: hunched position    Balance Overall balance assessment: Needs assistance Sitting-balance support: Feet supported Sitting balance-Leahy Scale: Fair   Postural control: Left lateral lean Standing balance support: Bilateral upper extremity supported Standing balance-Leahy Scale: Poor Standing balance comment: needing external assist                            ADL either performed or assessed with clinical judgement   ADL Overall ADL's : Needs assistance/impaired     Grooming: Minimal assistance;Standing           Upper Body Dressing : Minimal assistance;Sitting   Lower Body Dressing: Moderate assistance;Sitting/lateral leans   Toilet Transfer: Moderate assistance;RW   Toileting- Clothing Manipulation and Hygiene: Minimal assistance;Sit to/from stand       Functional mobility during ADLs: Moderate assistance;Rolling walker       Vision Patient Visual Report: No change from baseline       Perception     Praxis      Pertinent Vitals/Pain Pain Assessment: No/denies pain     Hand Dominance Right   Extremity/Trunk Assessment Upper Extremity Assessment Upper Extremity Assessment: Overall WFL for tasks assessed (Mild strength difference between L and R UE)           Communication Communication Communication: No difficulties   Cognition Arousal/Alertness: Awake/alert Behavior During Therapy: Flat affect Overall Cognitive Status: Impaired/Different from baseline Area of Impairment: Orientation;Attention;Following commands;Safety/judgement;Problem solving;Memory                   Current Attention Level: Sustained   Following Commands: Follows one step commands with increased time Safety/Judgement: Decreased awareness of safety   Problem Solving: Slow processing;Decreased initiation;Requires verbal cues;Requires tactile cues General Comments: Needing increased time for orientation questions   General Comments       Exercises     Shoulder Instructions      Home Living Family/patient expects to be discharged to:: Private  residence Living Arrangements: Children Available Help at Discharge: Family;Available 24 hours/day;Other (Comment) (Son lives with her, but is generally not around.  Daughter's can provide 24 hour assist.) Type of Home: House Home Access: Stairs to enter [employer] of Steps: 2 Entrance Stairs-Rails: None Home Layout: One level     Bathroom Shower/Tub: Teacher, early years/pre: Standard Bathroom Accessibility: Yes How Accessible: Accessible via walker Home Equipment: Bedside commode          Prior Functioning/Environment Level of Independence: Needs assistance  Gait / Transfers Assistance Needed: Walking without ues of an AD per daughter ADL's / Homemaking Assistance Needed: Needing increasing assist over the last month per daughter for: bathing/dressing/home management, and bill payment.            OT Problem List: Decreased strength;Decreased activity tolerance;Impaired balance (sitting and/or standing);Decreased knowledge of use of DME or AE;Decreased safety awareness;Decreased cognition      OT Treatment/Interventions: Self-care/ADL training;Therapeutic exercise;DME and/or AE instruction;Balance training;Therapeutic activities;Cognitive remediation/compensation    OT Goals(Current goals can be found in the care plan section) Acute Rehab OT Goals Patient Stated Goal: Hoping to go home OT Goal Formulation: With patient Time For Goal Achievement: 06/09/21 Potential to Achieve Goals: Good ADL Goals Pt Will Perform Grooming: with supervision;sitting;standing Pt Will Perform Upper Body Bathing: with set-up;sitting Pt Will Perform Lower Body Bathing: with min guard assist;sit to/from stand Pt Will Perform Upper Body Dressing: with supervision;sitting Pt Will Perform Lower Body Dressing: with min guard assist;sit to/from stand Pt Will Transfer to Toilet: with min assist;ambulating Pt Will Perform Toileting - Clothing Manipulation and hygiene: with min guard assist;sit to/from stand  OT Frequency: Min 2X/week   Barriers to D/C:    none noted       Co-evaluation PT/OT/SLP Co-Evaluation/Treatment: Yes Reason for Co-Treatment: For patient/therapist safety;Complexity of the patient's impairments (multi-system  involvement)   OT goals addressed during session: ADL's and self-care      AM-PAC OT "6 Clicks" Daily Activity     Outcome Measure Help from another person eating meals?: None Help from another person taking care of personal grooming?: A Little Help from another person toileting, which includes using toliet, bedpan, or urinal?: A Lot Help from another person bathing (including washing, rinsing, drying)?: A Lot Help from another person to put on and taking off regular upper body clothing?: A Little Help from another person to put on and taking off regular lower body clothing?: A Lot 6 Click Score: 16   End of Session Equipment Utilized During Treatment: Gait belt;Rolling walker Nurse Communication: Mobility status  Activity Tolerance: Patient limited by lethargy Patient left: in bed;with call bell/phone within reach;with bed alarm set  OT Visit Diagnosis: Unsteadiness on feet (R26.81);Muscle weakness (generalized) (M62.81);Other symptoms and signs involving cognitive function                Time: 0258-5277 OT Time Calculation (min): 27 min Charges:  OT General Charges $OT Visit: 1 Visit OT Evaluation $OT Eval Moderate Complexity: 1 Mod  05/26/2021  Rich, OTR/L  Acute Rehabilitation Services  Office:  [phone]   Metta Clines 05/26/2021, 11:55 AM

## 2021-05-26 NOTE — Progress Notes (Signed)
EEG Completed; Results Pending  

## 2021-05-26 NOTE — Consult Note (Signed)
Reason for Consult: Brain tumor Referring Physician: Medicine  Barbara Gibson is an 75 y.o. female.  HPI: 75 year old female admitted for evaluation of newly discovered right frontal mass.  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  Patient with a greater than 1 month history of increasing headaches.  Patient with some personality changes and progressive memory issues.  No definite weakness.  No sensory loss.  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  No history of weight loss or weight gain.  Past Medical History:  Diagnosis Date   Anxiety    COPD (chronic obstructive pulmonary disease) ([place])    Depression    High cholesterol    Hyperlipidemia    Myocardial infarction ([place]) 10/22/2014   "mild"   On home oxygen therapy    "2L; 24/7" (10/24/2014)   Pneumonia 04/2013    Past Surgical History:  Procedure Laterality Date   CARDIAC CATHETERIZATION  10/24/2014   LEFT HEART CATHETERIZATION WITH CORONARY ANGIOGRAM N/A 10/24/2014   Procedure: LEFT HEART CATHETERIZATION WITH CORONARY ANGIOGRAM;  Surgeon: Laverda Page, MD;  Location: [facility] CATH LAB;  Service: Cardiovascular;  Laterality: N/A;   TUBAL LIGATION  1980's    History reviewed. No pertinent family history.  Social History:  reports that she quit smoking about 8 years ago. Her smoking use included cigarettes. She has a 78.00 pack-year smoking history. She has never used smokeless tobacco. She reports that she does not drink alcohol and does not use drugs.  Allergies:  Allergies  Allergen Reactions   Sulfa Antibiotics Nausea And Vomiting    Medications: I have reviewed the patient's current medications.  Results for orders placed or performed during the hospital encounter of 05/25/21 (from the past 48 hour(s))  Comprehensive metabolic panel     Status: Abnormal   Collection Time: 05/25/21  6:03 AM  Result Value Ref Range    Sodium 135 135 - 145 mmol/L   Potassium 4.4 3.5 - 5.1 mmol/L   Chloride 95 (L) 98 - 111 mmol/L   CO2 35 (H) 22 - 32 mmol/L   Glucose, Bld 122 (H) 70 - 99 mg/dL   BUN 39 (H) 8 - 23 mg/dL   Creatinine, Ser 1.76 (H) 0.44 - 1.00 mg/dL   Calcium 9.2 8.9 - 10.3 mg/dL   Total Protein 7.3 6.5 - 8.1 g/dL   Albumin 4.2 3.5 - 5.0 g/dL   AST 34 15 - 41 U/L   ALT 14 0 - 44 U/L   Alkaline Phosphatase 56 38 - 126 U/L   Total Bilirubin 0.7 0.3 - 1.2 mg/dL   GFR, Estimated 30 (L) >60 mL/min    Comment: (NOTE) Calculated using the CKD-EPI Creatinine Equation (2021)    Anion gap 5 5 - 15    Comment: Performed at [facility], [address], [place], [place] [address]  CBC with Differential     Status: None   Collection Time: 05/25/21  6:03 AM  Result Value Ref Range   WBC 8.8 4.0 - 10.5 K/uL   RBC 4.63 3.87 - 5.11 MIL/uL   Hemoglobin 13.7 12.0 - 15.0 g/dL   HCT 44.2 36.0 - 46.0 %   MCV 95.5 80.0 - 100.0 fL   MCH 29.6 26.0 - 34.0 pg   MCHC 31.0 30.0 - 36.0 g/dL   RDW 12.6 11.5 - 15.5 %   Platelets 282 150 - 400 K/uL   nRBC 0.0 0.0 -  0.2 %   Neutrophils Relative % 85 %   Neutro Abs 7.4 1.7 - 7.7 K/uL   Lymphocytes Relative 8 %   Lymphs Abs 0.7 0.7 - 4.0 K/uL   Monocytes Relative 6 %   Monocytes Absolute 0.5 0.1 - 1.0 K/uL   Eosinophils Relative 0 %   Eosinophils Absolute 0.0 0.0 - 0.5 K/uL   Basophils Relative 0 %   Basophils Absolute 0.0 0.0 - 0.1 K/uL   Immature Granulocytes 1 %   Abs Immature Granulocytes 0.04 0.00 - 0.07 K/uL    Comment: Performed at [facility], [address], [place], [place] [address]  Troponin I (High Sensitivity)     Status: None   Collection Time: 05/25/21  6:03 AM  Result Value Ref Range   Troponin I (High Sensitivity) 16 <18 ng/L    Comment: (NOTE) Elevated high sensitivity troponin I (hsTnI) values and significant  changes across serial measurements may suggest ACS but many other  chronic and acute conditions are known to elevate hsTnI results.  Refer  to the Links section for chest pain algorithms and additional  guidance. Performed at [facility], [address], [place], [place] [address]   Brain natriuretic peptide     Status: None   Collection Time: 05/25/21  6:03 AM  Result Value Ref Range   B Natriuretic Peptide 68.0 0.0 - 100.0 pg/mL    Comment: Performed at [facility], [address] [place], [place] [address]  Resp Panel by RT-PCR (Flu A&B, Covid) Nasopharyngeal Swab     Status: None   Collection Time: 05/25/21  7:13 AM   Specimen: Nasopharyngeal Swab; Nasopharyngeal(NP) swabs in vial transport medium  Result Value Ref Range   SARS Coronavirus 2 by RT PCR NEGATIVE NEGATIVE    Comment: (NOTE) SARS-CoV-2 target nucleic acids are NOT DETECTED.  The SARS-CoV-2 RNA is generally detectable in upper respiratory specimens during the acute phase of infection. The lowest concentration of SARS-CoV-2 viral copies this assay can detect is 138 copies/mL. A negative result does not preclude SARS-Cov-2 infection and should not be used as the sole basis for treatment or other patient management decisions. A negative result may occur with  improper specimen collection/handling, submission of specimen other than nasopharyngeal swab, presence of viral mutation(s) within the areas targeted by this assay, and inadequate number of viral copies(<138 copies/mL). A negative result must be combined with clinical observations, patient history, and epidemiological information. The expected result is Negative.  Fact Sheet for Patients:  EntrepreneurPulse.com.au  Fact Sheet for Healthcare Providers:  IncredibleEmployment.be  This test is no t yet approved or cleared by the [place] FDA and  has been authorized for detection and/or diagnosis of SARS-CoV-2 by FDA under an Emergency Use Authorization (EUA). This EUA will remain  in effect (meaning this test can be used) for the duration of the COVID-19  declaration under Section 564(b)(1) of the Act, 21 U.S.C.section 360bbb-3(b)(1), unless the authorization is terminated  or revoked sooner.       Influenza A by PCR NEGATIVE NEGATIVE   Influenza B by PCR NEGATIVE NEGATIVE    Comment: (NOTE) The Xpert Xpress SARS-CoV-2/FLU/RSV plus assay is intended as an aid in the diagnosis of influenza from Nasopharyngeal swab specimens and should not be used as a sole basis for treatment. Nasal washings and aspirates are unacceptable for Xpert Xpress SARS-CoV-2/FLU/RSV testing.  Fact Sheet for Patients: EntrepreneurPulse.com.au  Fact Sheet for Healthcare Providers: IncredibleEmployment.be  This test is not yet approved or  cleared by the [place] and has been authorized for detection and/or diagnosis of SARS-CoV-2 by FDA under an Emergency Use Authorization (EUA). This EUA will remain in effect (meaning this test can be used) for the duration of the COVID-19 declaration under Section 564(b)(1) of the Act, 21 U.S.C. section 360bbb-3(b)(1), unless the authorization is terminated or revoked.  Performed at [facility], [address] [place], [place] [address]   CBG monitoring, ED     Status: Abnormal   Collection Time: 05/25/21  8:15 AM  Result Value Ref Range   Glucose-Capillary 101 (H) 70 - 99 mg/dL    Comment: Glucose reference range applies only to samples taken after fasting for at least 8 hours.  Urinalysis, Routine w reflex microscopic Urine, Random     Status: Abnormal   Collection Time: 05/25/21  8:58 AM  Result Value Ref Range   Color, Urine YELLOW YELLOW   APPearance HAZY (A) CLEAR   Specific Gravity, Urine 1.013 1.005 - 1.030   pH 6.0 5.0 - 8.0   Glucose, UA NEGATIVE NEGATIVE mg/dL   Hgb urine dipstick SMALL (A) NEGATIVE   Bilirubin Urine NEGATIVE NEGATIVE   Ketones, ur 20 (A) NEGATIVE mg/dL   Protein, ur 30 (A) NEGATIVE mg/dL   Nitrite NEGATIVE NEGATIVE   Leukocytes,Ua LARGE (A)  NEGATIVE   RBC / HPF 0-5 0 - 5 RBC/hpf   WBC, UA >50 (H) 0 - 5 WBC/hpf   Bacteria, UA RARE (A) NONE SEEN   Squamous Epithelial / LPF 0-5 0 - 5   WBC Clumps PRESENT    Mucus PRESENT    Budding Yeast PRESENT    Hyaline Casts, UA PRESENT    Ca Oxalate Crys, UA PRESENT     Comment: Performed at [facility], [address] [place], [place] [address]  Blood gas, arterial     Status: Abnormal   Collection Time: 05/25/21  9:09 AM  Result Value Ref Range   FIO2 36.00    pH, Arterial 7.279 (L) 7.350 - 7.450   pCO2 arterial 68.6 (HH) 32.0 - 48.0 mmHg    Comment: CRITICAL RESULT CALLED TO, READ BACK BY AND VERIFIED WITH: DILDY @ 0924 ON [id number] BY HENDERSON L    pO2, Arterial 126 (H) 83.0 - 108.0 mmHg   Bicarbonate 27.2 20.0 - 28.0 mmol/L   Acid-Base Excess 4.9 (H) 0.0 - 2.0 mmol/L   O2 Saturation 98.2 %   Patient temperature 36.6    Allens test (pass/fail) PASS PASS    Comment: Performed at [facility], [address], [place], [place] [address]  Glucose, capillary     Status: Abnormal   Collection Time: 05/26/21  7:55 AM  Result Value Ref Range   Glucose-Capillary 120 (H) 70 - 99 mg/dL    Comment: Glucose reference range applies only to samples taken after fasting for at least 8 hours.    DG Chest 2 View  Result Date: 05/25/2021 CLINICAL DATA:  Weakness. EXAM: CHEST - 2 VIEW COMPARISON:  Chest x-ray report 03/05/2016. FINDINGS: Mediastinum and hilar structures normal. Heart size normal. Low lung volumes with mild bibasilar atelectasis. Mild left base infiltrate cannot be excluded. No pleural effusion or pneumothorax. IMPRESSION: Low lung volumes with mild bibasilar atelectasis. Mild left base infiltrate cannot be excluded. Electronically Signed   By: Marcello Moores  Register   On: 05/25/2021 06:49   MR BRAIN WO CONTRAST  Result Date: 05/25/2021 CLINICAL DATA:  Delirium, prior history of DCIS left breast EXAM: MRI HEAD  WITHOUT CONTRAST TECHNIQUE: Multiplanar, multiecho pulse sequences of the  brain and surrounding structures were obtained without intravenous contrast. COMPARISON:  None. FINDINGS: Brain: There is a 2.7 x 2.7 cm rounded mass of the anteroinferior right frontal lobe with extensive surrounding edema. There is regional mass effect including leftward midline shift with subfalcine herniation measuring 1 cm. No significant central herniation. No evidence of acute infarction or hemorrhage. Additional patchy T2 hyperintensity in the supratentorial and pontine white matter is nonspecific but probably reflects chronic microvascular ischemic changes. There is no extra-axial collection. There is no hydrocephalus. Vascular: Major vessel flow voids at the skull base are preserved. Skull and upper cervical spine: Normal marrow signal is preserved. Sinuses/Orbits: Minor mucosal thickening.  Orbits are unremarkable. Other: Sella is unremarkable.  Mastoid air cells are clear. IMPRESSION: 2.7 cm anterior inferior right frontal lobe mass with extensive surrounding edema. Metastasis is the primary differential consideration. There is 1 cm of leftward midline shift. No hydrocephalus. Postcontrast imaging is recommended to evaluate for additional lesions. These results will be called to the ordering clinician or representative by the Radiologist Assistant, and communication documented in the PACS or [employer]. Electronically Signed   By: Macy Mis M.D.   On: 05/25/2021 12:27   MR BRAIN W WO CONTRAST  Result Date: 05/26/2021 CLINICAL DATA:  Intracranial metastatic disease. Generalized weakness. EXAM: MRI HEAD WITHOUT AND WITH CONTRAST TECHNIQUE: Multiplanar, multiecho pulse sequences of the brain and surrounding structures were obtained without and with intravenous contrast. CONTRAST:  7.26mL GADAVIST GADOBUTROL 1 MMOL/ML IV SOLN COMPARISON:  Brain MRI 05/25/2021 at 11:06 a.m. FINDINGS: Brain: Unchanged appearance of right frontal lesion with large amount of surrounding edema. Unchanged leftward  midline shift of 8 mm. Right frontal mass shows heterogeneous contrast enhancement and measures 3.2 x 2.6 cm. There are no other contrast-enhancing lesions. There is no acute or chronic hemorrhage. Normal white matter signal, parenchymal volume and CSF spaces. The midline structures are normal. Vascular: Major flow voids are preserved. Skull and upper cervical spine: Normal calvarium and skull base. Visualized upper cervical spine and soft tissues are normal. Sinuses/Orbits:No paranasal sinus fluid levels or advanced mucosal thickening. No mastoid or middle ear effusion. Normal orbits. IMPRESSION: 1. Right frontal mass measuring 3.2 x 2.6 cm, with large amount of surrounding edema and 8 mm leftward midline shift. 2. No other metastatic lesions. Electronically Signed   By: Ulyses Jarred M.D.   On: 05/26/2021 00:04   CT CHEST ABDOMEN PELVIS WO CONTRAST  Result Date: 05/25/2021 CLINICAL DATA:  75 year old female with evaluation for metastatic disease. EXAM: CT CHEST, ABDOMEN AND PELVIS WITHOUT CONTRAST TECHNIQUE: Multidetector CT imaging of the chest, abdomen and pelvis was performed following the standard protocol without IV contrast. COMPARISON:  None. FINDINGS: Evaluation of this exam is limited in the absence of intravenous contrast. CT CHEST FINDINGS Cardiovascular: There is no cardiomegaly or pericardial effusion. Three-vessel coronary vascular calcification. Advanced atherosclerotic calcification of the thoracic aorta. No aneurysmal dilatation. The central pulmonary arteries are grossly unremarkable. Mediastinum/Nodes: No hilar or mediastinal adenopathy. The esophagus is grossly unremarkable. Enlarged thyroid gland with multiple nodules, likely multinodular goiter. No mediastinal fluid collection. Lungs/Pleura: Background of emphysema. There is a 1.7 x 1.0 cm left upper lobe subpleural nodule with irregular margins suspicious for a neoplastic process (32/7). Correlation with history of known malignancy  and/or multidisciplinary consult and further evaluation with PET-CT is recommended. There is a 7 mm nodule with irregular margins in the left upper lobe (21/7). Apparent small nodular  densities in the right upper lobe likely confluence of interstitial markings. There is however a 3 mm right upper lobe nodule (30/7). No lobar consolidation, pleural effusion, or pneumothorax. The central airways are patent. Musculoskeletal: Old healed right posterior rib fractures. No acute osseous pathology. Osteopenia with degenerative changes of the spine. CT ABDOMEN PELVIS FINDINGS No intra-abdominal free air or free fluid. Hepatobiliary: The liver is unremarkable. No intrahepatic biliary dilatation. The gallbladder is unremarkable. Pancreas: Unremarkable. No pancreatic ductal dilatation or surrounding inflammatory changes. Spleen: Normal in size without focal abnormality. Adrenals/Urinary Tract: The adrenal glands are unremarkable. There is a 2 mm nonobstructing right renal upper pole calculus. No hydronephrosis. The left kidney is unremarkable. The visualized ureters and urinary bladder are unremarkable. Stomach/Bowel: There is severe sigmoid diverticulosis without active inflammatory changes. There is no bowel obstruction or active inflammation. The appendix is normal. Vascular/Lymphatic: Advanced aortoiliac atherosclerotic disease. The IVC is unremarkable. No portal venous gas. There is no adenopathy. Reproductive: The uterus and ovaries are grossly unremarkable. No adnexal masses. Other: None Musculoskeletal: Osteopenia with degenerative changes of the spine. No acute osseous pathology. IMPRESSION: 1. Left upper lobe 1.7 x 1.0 cm subpleural nodule suspicious for a neoplastic process. Additional smaller pulmonary nodules as above. Correlation with history of known malignancy or multidisciplinary consult and further evaluation with PET-CT is recommended. 2. No evidence of metastatic disease in the abdomen and pelvis. 3. Severe  sigmoid diverticulosis. No bowel obstruction. Normal appendix. 4. Multinodular goiter. 5. Aortic Atherosclerosis (ICD10-I70.0) and Emphysema (ICD10-J43.9). Electronically Signed   By: Anner Crete M.D.   On: 05/25/2021 23:53    Patient is awake and aware.  She is somewhat blunted.  Speech is reasonably fluent and clear.  Memory is poor.  She is oriented however.  Cranial nerve function normal bilateral.  Motor examination 5/5 bilaterally without evidence of pronator drift.  Sensory examination nonfocal.  Gait and posture not tested.  Examination head ears eyes nose throat is unremarked.  Chest and abdomen are benign.  Extremities are free from injury or deformity. Blood pressure (!) 142/59, pulse 82, temperature 98.2 F (36.8 C), temperature source Oral, resp. rate 20, SpO2 98 %.   Assessment/Plan: Newly discovered right inferior frontal lobe mass which appears to be dural based with surrounding significant edema.  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  I discussed situation with the patient.  My recommendation would be for surgical resection both for establishing tumor type but hopeful improvement of her symptoms.  I unfortunately am scheduled to undergo surgery myself later today and will need to past this case off to one of my partners.  Mallie Mussel A Kleber Crean 05/26/2021, 8:17 AM

## 2021-05-27 ENCOUNTER — Encounter (HOSPITAL_COMMUNITY): Payer: Self-pay | Admitting: Internal Medicine

## 2021-05-27 DIAGNOSIS — Z87891 Personal history of nicotine dependence: Secondary | ICD-10-CM

## 2021-05-27 DIAGNOSIS — C3412 Malignant neoplasm of upper lobe, left bronchus or lung: Secondary | ICD-10-CM | POA: Diagnosis not present

## 2021-05-27 DIAGNOSIS — N179 Acute kidney failure, unspecified: Secondary | ICD-10-CM | POA: Diagnosis not present

## 2021-05-27 DIAGNOSIS — C7931 Secondary malignant neoplasm of brain: Secondary | ICD-10-CM | POA: Diagnosis not present

## 2021-05-27 DIAGNOSIS — R627 Adult failure to thrive: Secondary | ICD-10-CM | POA: Diagnosis not present

## 2021-05-27 DIAGNOSIS — N390 Urinary tract infection, site not specified: Secondary | ICD-10-CM | POA: Diagnosis not present

## 2021-05-27 LAB — BASIC METABOLIC PANEL
Anion gap: 6 (ref 5–15)
BUN: 25 mg/dL — ABNORMAL HIGH (ref 8–23)
CO2: 34 mmol/L — ABNORMAL HIGH (ref 22–32)
Calcium: 9 mg/dL (ref 8.9–10.3)
Chloride: 100 mmol/L (ref 98–111)
Creatinine, Ser: 0.79 mg/dL (ref 0.44–1.00)
GFR, Estimated: >60 mL/min (ref >60)
Glucose, Bld: 137 mg/dL — ABNORMAL HIGH (ref 70–99)
Potassium: 4.6 mmol/L (ref 3.5–5.1)
Sodium: 140 mmol/L (ref 135–145)

## 2021-05-27 LAB — CBC
HCT: 34.1 % — ABNORMAL LOW (ref 36.0–46.0)
Hemoglobin: 10.6 g/dL — ABNORMAL LOW (ref 12.0–15.0)
MCH: 28.3 pg (ref 26.0–34.0)
MCHC: 31.1 g/dL (ref 30.0–36.0)
MCV: 91.2 fL (ref 80.0–100.0)
Platelets: 241 10*3/uL (ref 150–400)
RBC: 3.74 MIL/uL — ABNORMAL LOW (ref 3.87–5.11)
RDW: 12.5 % (ref 11.5–15.5)
WBC: 6 10*3/uL (ref 4.0–10.5)
nRBC: 0 % (ref 0.0–0.2)

## 2021-05-27 LAB — GLUCOSE, CAPILLARY: Glucose-Capillary: 141 mg/dL — ABNORMAL HIGH (ref 70–99)

## 2021-05-27 MED ORDER — PANTOPRAZOLE SODIUM 40 MG PO TBEC
40.0000 mg | DELAYED_RELEASE_TABLET | Freq: Every day | ORAL | Status: DC
  Administered 2021-05-27 – 2021-06-09 (×13): 40 mg via ORAL
  Filled 2021-05-27 (×13): qty 1

## 2021-05-27 MED ORDER — FUROSEMIDE 10 MG/ML IJ SOLN
20.0000 mg | Freq: Once | INTRAMUSCULAR | Status: AC
  Administered 2021-05-27: 20 mg via INTRAVENOUS
  Filled 2021-05-27: qty 4

## 2021-05-27 MED ORDER — NYSTATIN 100000 UNIT/GM EX POWD
1.0000 "application " | Freq: Two times a day (BID) | CUTANEOUS | Status: DC
  Administered 2021-05-27 – 2021-06-09 (×25): 1 via TOPICAL
  Filled 2021-05-27 (×2): qty 15

## 2021-05-27 NOTE — Plan of Care (Signed)

## 2021-05-27 NOTE — Progress Notes (Signed)
PROGRESS NOTE    Barbara Gibson  ERX:[id number] DOB: 12-Mar-1946 DOA: 05/25/2021 PCP: [name], MD    Chief Complaint  Patient presents with   Weakness    Brief Narrative:   Tyshawn Keel is a 75 y.o. female with medical history significant of anxiety, COPD, depression, hyperlipidemia, CAD, history of MI, former smoker, history of recent UTI.  History of breast cancer status post lumpectomy, few years ago no radiation or chemo given her poor general health presenting with generalized weaknesses x3 days.  Her work-up significant for brain mass, likely meningioma per neurosurgery.  Assessment & Plan:   Principal Problem:   Brain mass Active Problems:   Panlobular emphysema (HCC)   Coronary artery disease involving native coronary artery of native heart without angina pectoris   AKI (acute kidney injury) ([place])   Weakness   Acute lower UTI   Dehydration   Encephalopathy   New brain mass with edema/encephalopathy -Possible meningioma versus metastasis MRI of the brain revealed 2.7 cm anterior inferior right frontal lobe mass with extensive surrounding edema.  -Neurosurgery input greatly appreciated, findings concerning for meningioma, plan for surgical resection, timing per neurosurgery, she will need to remain 7 days of Plavix, so surgery likely early next week, meanwhile continue with IV Decadron given evidence of edema and midline shift. -Neurology input greatly appreciated, EEG unremarkable for seizures, but has rhythmic delta slowing in the right frontal region, for now continue with Keppra 250 mg p.o. twice daily, if she has any breakthrough seizures activity, then it can be increased to 500 mg p.o. twice daily  History of breast cancer -Status post lumpectomy, no radiation, CT chest/abdomen/pelvis significant for left upper lobe 1.7 x 1 cm subpleural nodule, and smaller pulmonary nodules, have consulted oncology regarding further recommendations.   AKI (acute kidney  injury) ([place]) -Solved with IV fluids, she is with some trace edema today, I will stop her fluids and give low dose of lasix.   Chronic hypoxic respiratory failure - At baseline, on 4 L nasal cannula   lower UTI  -Continue with Rocephin   Altered, confused due to acute metabolic encephalopathy  - in the setting of brain mass and UTI -Improving with IV steroids and antibiotics, daughter at bedside reports she is not back to her baseline  COPD/emphysema ([place]) - -As needed DuoNeb, bronchodilators, supplemental oxygen as needed -No signs of exacerbation   Coronary artery disease involving native coronary artery of native heart without angina pectoris -Resume home medication including metoprolol, statins, will hold Plavix in case she needs neurosurgery   Hyperlipidemia -continue home medication of simvastatin,   Weakness -PT OT, fall precaution  DVT prophylaxis: Harrison heaprin Code Status: Full Family Communication: None at bedside Disposition:   Status is: inpatient  The patient will require care spanning > 2 midnights and should be moved to inpatient because: IV treatments appropriate due to intensity of illness or inability to take PO  Dispo: The patient is from: Home              Anticipated d/c is to: Home              Patient currently is not medically stable to d/c.   Difficult to place patient No       Consultants:  Neurosurgery Oncology   Subjective:  He does report mild dyspnea, she denies any chest pain, reports generalized weakness and fatigue.   Objective: Vitals:   05/27/21 0354 05/27/21 0628 05/27/21 0812 05/27/21 1130  BP: Marland Kitchen)  133/115 (!) 144/69 (!) 165/57 (!) 157/84  Pulse: 73 74 91 67  Resp: 16  18 18   Temp: 98.7 F (37.1 C)  98.4 F (36.9 C) 97.8 F (36.6 C)  TempSrc: Oral  Oral Oral  SpO2: 98%  96% 100%  Weight:        Intake/Output Summary (Last 24 hours) at 05/27/2021 1212 Last data filed at 05/27/2021 0900 Gross per 24 hour  Intake  1309.97 ml  Output 600 ml  Net 709.97 ml   Filed Weights   05/27/21 0332  Weight: 74.9 kg    Examination:  Awake Alert, Oriented X 3, No new F.N deficits, Normal affect Symmetrical Chest wall movement, Good air movement bilaterally, CTAB RRR,No Gallops,Rubs or new Murmurs, No Parasternal Heave +ve B.Sounds, Abd Soft, No tenderness, No rebound - guarding or rigidity. No Cyanosis, Clubbing , trace edema, No new Rash or bruise       Data Reviewed: I have personally reviewed following labs and imaging studies  CBC: Recent Labs  Lab 05/25/21 0603 05/26/21 1113 05/27/21 0602  WBC 8.8 5.7 6.0  NEUTROABS 7.4  --   --   HGB 13.7 11.9* 10.6*  HCT 44.2 37.7 34.1*  MCV 95.5 91.1 91.2  PLT 282 258 132    Basic Metabolic Panel: Recent Labs  Lab 05/25/21 0603 05/26/21 1113 05/27/21 0602  NA 135 136 140  K 4.4 4.2 4.6  CL 95* 95* 100  CO2 35* 33* 34*  GLUCOSE 122* 206* 137*  BUN 39* 30* 25*  CREATININE 1.76* 1.18* 0.79  CALCIUM 9.2 9.6 9.0    GFR: CrCl cannot be calculated (Unknown ideal weight.).  Liver Function Tests: Recent Labs  Lab 05/25/21 0603  AST 34  ALT 14  ALKPHOS 56  BILITOT 0.7  PROT 7.3  ALBUMIN 4.2    CBG: Recent Labs  Lab 05/25/21 0815 05/26/21 0755 05/27/21 0627  GLUCAP 101* 120* 141*     Recent Results (from the past 240 hour(s))  Resp Panel by RT-PCR (Flu A&B, Covid) Nasopharyngeal Swab     Status: None   Collection Time: 05/25/21  7:13 AM   Specimen: Nasopharyngeal Swab; Nasopharyngeal(NP) swabs in vial transport medium  Result Value Ref Range Status   SARS Coronavirus 2 by RT PCR NEGATIVE NEGATIVE Final    Comment: (NOTE) SARS-CoV-2 target nucleic acids are NOT DETECTED.  The SARS-CoV-2 RNA is generally detectable in upper respiratory specimens during the acute phase of infection. The lowest concentration of SARS-CoV-2 viral copies this assay can detect is 138 copies/mL. A negative result does not preclude  SARS-Cov-2 infection and should not be used as the sole basis for treatment or other patient management decisions. A negative result may occur with  improper specimen collection/handling, submission of specimen other than nasopharyngeal swab, presence of viral mutation(s) within the areas targeted by this assay, and inadequate number of viral copies(<138 copies/mL). A negative result must be combined with clinical observations, patient history, and epidemiological information. The expected result is Negative.  Fact Sheet for Patients:  EntrepreneurPulse.com.au  Fact Sheet for Healthcare Providers:  IncredibleEmployment.be  This test is no t yet approved or cleared by the [place] FDA and  has been authorized for detection and/or diagnosis of SARS-CoV-2 by FDA under an Emergency Use Authorization (EUA). This EUA will remain  in effect (meaning this test can be used) for the duration of the COVID-19 declaration under Section 564(b)(1) of the Act, 21 U.S.C.section 360bbb-3(b)(1), unless the authorization is terminated  or revoked sooner.       Influenza A by PCR NEGATIVE NEGATIVE Final   Influenza B by PCR NEGATIVE NEGATIVE Final    Comment: (NOTE) The Xpert Xpress SARS-CoV-2/FLU/RSV plus assay is intended as an aid in the diagnosis of influenza from Nasopharyngeal swab specimens and should not be used as a sole basis for treatment. Nasal washings and aspirates are unacceptable for Xpert Xpress SARS-CoV-2/FLU/RSV testing.  Fact Sheet for Patients: EntrepreneurPulse.com.au  Fact Sheet for Healthcare Providers: IncredibleEmployment.be  This test is not yet approved or cleared by the [place] FDA and has been authorized for detection and/or diagnosis of SARS-CoV-2 by FDA under an Emergency Use Authorization (EUA). This EUA will remain in effect (meaning this test can be used) for the duration of  the COVID-19 declaration under Section 564(b)(1) of the Act, 21 U.S.C. section 360bbb-3(b)(1), unless the authorization is terminated or revoked.  Performed at [facility], [address], [place], [place] [address]          Radiology Studies: MR BRAIN W WO CONTRAST  Result Date: 05/26/2021 CLINICAL DATA:  Intracranial metastatic disease. Generalized weakness. EXAM: MRI HEAD WITHOUT AND WITH CONTRAST TECHNIQUE: Multiplanar, multiecho pulse sequences of the brain and surrounding structures were obtained without and with intravenous contrast. CONTRAST:  7.45mL GADAVIST GADOBUTROL 1 MMOL/ML IV SOLN COMPARISON:  Brain MRI 05/25/2021 at 11:06 a.m. FINDINGS: Brain: Unchanged appearance of right frontal lesion with large amount of surrounding edema. Unchanged leftward midline shift of 8 mm. Right frontal mass shows heterogeneous contrast enhancement and measures 3.2 x 2.6 cm. There are no other contrast-enhancing lesions. There is no acute or chronic hemorrhage. Normal white matter signal, parenchymal volume and CSF spaces. The midline structures are normal. Vascular: Major flow voids are preserved. Skull and upper cervical spine: Normal calvarium and skull base. Visualized upper cervical spine and soft tissues are normal. Sinuses/Orbits:No paranasal sinus fluid levels or advanced mucosal thickening. No mastoid or middle ear effusion. Normal orbits. IMPRESSION: 1. Right frontal mass measuring 3.2 x 2.6 cm, with large amount of surrounding edema and 8 mm leftward midline shift. 2. No other metastatic lesions. Electronically Signed   By: Ulyses Jarred M.D.   On: 05/26/2021 00:04   EEG adult  Result Date: 05/26/2021 Lora Havens, MD     05/26/2021 10:06 AM Patient Name: Ayline Dingus MRN: [id number] Epilepsy Attending: Lora Havens Referring Physician/Provider: Dr Lesleigh Noe Date: 05/26/2021 Duration: 23.51 mins Patient history: 75 year old female with history of breast cancer now with brain mets  presented with weakness.  Patient's daughter also reported episodes of left side shaking followed by weakness on the left side.  EEG to evaluate for seizures. Level of alertness: Awake AEDs during EEG study: Keppra Technical aspects: This EEG study was done with scalp electrodes positioned according to the 10-20 International system of electrode placement. Electrical activity was acquired at a sampling rate of 500Hz  and reviewed with a high frequency filter of 70Hz  and a low frequency filter of 1Hz . EEG data were recorded continuously and digitally stored. Description: The posterior dominant rhythm consists of 8-9 Hz activity of moderate voltage (25-35 uV) seen predominantly in posterior head regions, symmetric and reactive to eye opening and eye closing. EEG showed continuous rhythmic 2 to 3 Hz sharply contoured delta slowing in her right frontal region. Hyperventilation and photic stimulation were not performed.   ABNORMALITY -Continuous rhythmic delta slow, right frontal region IMPRESSION: This study is suggestive of cortical dysfunction in the right frontal region  likely secondary to underlying mass.  Additionally, rhythmic delta slowing in frontal region is on the ictal-interictal continuum with low potential for seizures.  No seizures or definite epileptiform discharges were seen throughout the recording. Priyanka Barbra Sarks   CT CHEST ABDOMEN PELVIS WO CONTRAST  Result Date: 05/25/2021 CLINICAL DATA:  75 year old female with evaluation for metastatic disease. EXAM: CT CHEST, ABDOMEN AND PELVIS WITHOUT CONTRAST TECHNIQUE: Multidetector CT imaging of the chest, abdomen and pelvis was performed following the standard protocol without IV contrast. COMPARISON:  None. FINDINGS: Evaluation of this exam is limited in the absence of intravenous contrast. CT CHEST FINDINGS Cardiovascular: There is no cardiomegaly or pericardial effusion. Three-vessel coronary vascular calcification. Advanced atherosclerotic  calcification of the thoracic aorta. No aneurysmal dilatation. The central pulmonary arteries are grossly unremarkable. Mediastinum/Nodes: No hilar or mediastinal adenopathy. The esophagus is grossly unremarkable. Enlarged thyroid gland with multiple nodules, likely multinodular goiter. No mediastinal fluid collection. Lungs/Pleura: Background of emphysema. There is a 1.7 x 1.0 cm left upper lobe subpleural nodule with irregular margins suspicious for a neoplastic process (32/7). Correlation with history of known malignancy and/or multidisciplinary consult and further evaluation with PET-CT is recommended. There is a 7 mm nodule with irregular margins in the left upper lobe (21/7). Apparent small nodular densities in the right upper lobe likely confluence of interstitial markings. There is however a 3 mm right upper lobe nodule (30/7). No lobar consolidation, pleural effusion, or pneumothorax. The central airways are patent. Musculoskeletal: Old healed right posterior rib fractures. No acute osseous pathology. Osteopenia with degenerative changes of the spine. CT ABDOMEN PELVIS FINDINGS No intra-abdominal free air or free fluid. Hepatobiliary: The liver is unremarkable. No intrahepatic biliary dilatation. The gallbladder is unremarkable. Pancreas: Unremarkable. No pancreatic ductal dilatation or surrounding inflammatory changes. Spleen: Normal in size without focal abnormality. Adrenals/Urinary Tract: The adrenal glands are unremarkable. There is a 2 mm nonobstructing right renal upper pole calculus. No hydronephrosis. The left kidney is unremarkable. The visualized ureters and urinary bladder are unremarkable. Stomach/Bowel: There is severe sigmoid diverticulosis without active inflammatory changes. There is no bowel obstruction or active inflammation. The appendix is normal. Vascular/Lymphatic: Advanced aortoiliac atherosclerotic disease. The IVC is unremarkable. No portal venous gas. There is no adenopathy.  Reproductive: The uterus and ovaries are grossly unremarkable. No adnexal masses. Other: None Musculoskeletal: Osteopenia with degenerative changes of the spine. No acute osseous pathology. IMPRESSION: 1. Left upper lobe 1.7 x 1.0 cm subpleural nodule suspicious for a neoplastic process. Additional smaller pulmonary nodules as above. Correlation with history of known malignancy or multidisciplinary consult and further evaluation with PET-CT is recommended. 2. No evidence of metastatic disease in the abdomen and pelvis. 3. Severe sigmoid diverticulosis. No bowel obstruction. Normal appendix. 4. Multinodular goiter. 5. Aortic Atherosclerosis (ICD10-I70.0) and Emphysema (ICD10-J43.9). Electronically Signed   By: Anner Crete M.D.   On: 05/25/2021 23:53        Scheduled Meds:  citalopram  20 mg Oral Daily   dexamethasone (DECADRON) injection  4 mg Intravenous Q6H   heparin  5,000 Units Subcutaneous Q8H   levETIRAcetam  250 mg Oral BID   metoprolol tartrate  12.5 mg Oral BID   multivitamin with minerals  1 tablet Oral Daily   simvastatin  20 mg Oral q1800   sodium chloride flush  3 mL Intravenous Q12H   sodium chloride flush  3 mL Intravenous Q12H   Continuous Infusions:  sodium chloride     cefTRIAXone (ROCEPHIN)  IV 1 g (05/26/21 1223)  LOS: 1 day      Phillips Climes, MD [facility]   To contact the attending provider between 7A-7P or the covering provider during after hours 7P-7A, please log into the web site [email or web address] and access using universal [place] password for that web site. If you do not have the password, please call the hospital operator.  05/27/2021, 12:12 PM

## 2021-05-27 NOTE — Consult Note (Addendum)
[place]  [phone] [phone]   MEDICAL ONCOLOGY - INITIAL CONSULTATION  Referral MD: Dr. 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  She presented to the emergency department with generalized weakness x3 days.  She recently had a UTI was treated with 3 days of antibiotics and she also received some steroids for COPD exacerbation.  Labs on admission notable for a glucose of 122, BUN 39, creatinine 1.76.  She had an MRI of the brain without contrast which showed a 2.7 cm anterior inferior right frontal lobe mass with extensive surrounding edema, metastasis is the primary differential consideration, 1 cm of leftward midline shift, no hydrocephalus.  CT chest/abdomen/pelvis performed without contrast showed a left upper lobe 1.7 x 1.0 cm subpleural nodule suspicious for a neoplastic process, no evidence of static disease in the abdomen and pelvis.  She then had an MRI of the brain with and without contrast which showed a right frontal mass measuring 3.2 x 2.6 cm with a large amount of surrounding edema and 8 mm leftward midline shift, no other metastatic lesions. The patient was seen by neurology due to the brain mass and concern for focal seizures.  She has been started on dexamethasone and Keppra.  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  Neurosurgery recommends surgical resection.  The patient does have a history of left breast DCIS which was initially diagnosed in March 2017.  She is currently followed at [facility] and according to their last note "abnormal mammogram  in 01/2016 that lead to dx of left sided DCIS. Patient underwent left lumpectomy in 02/2016 with course complicated by abscess requiring IND and IV Abx and further complicated by C diff colitis and debilitation. XRT to the left breast was deferred owing to medical comorbidities. She is currently on Letrazole 2.5 mg daily. Mammogram on March 02 2018 was read as BIRADS 1.  Most recent mammogram was performed 07/22/2020 and read as BI-RADS Category 2: Benign.  Last visit with medical oncology was on 04/01/2021 and she was advised to discontinue treatment with letrozole at the end of May 2022 due to completing 5 years of therapy.   I met with Ms. Barbara Gibson and her 2 daughters in her hospital room.  The patient tells me that she has been having weakness for about 2 months.  She also had a fall in her bathroom prior to admission.  She was recently treated for a UTI with cefuroxime and also received a Medrol Dosepak for her COPD exacerbation.  Initially, when she was started on high-dose steroids she felt much better but then started develop worsening weakness.  She reports having a headache daily for about 2 months.  She has had a poor appetite and has lost weight.  She has her baseline shortness of breath and wears O2 at 4 L/min continuously at baseline.  She denies chest pain.  She has noticed an increased cough since hospital admission.  Cough is nonproductive and she denies hemoptysis.  Denies abdominal pain, nausea, vomiting.  She reports that she has loose stools.  Family has noticed tremors in her arms  and legs but no evidence of grand mal seizure activity. The patient's daughter states that she has not been feeling well overall since about March 2022.  Prior to this time, she was able to independently perform ADLs and IADLs.  The patient is widowed.  She has 2 daughters and twin boys.  Denies history of alcohol use.  She previously smoked 1 to 1.5 packs of cigarettes daily for about 52 years.  Family history significant  for a father who died from cancer, mother who had breast cancer in her 23s, sister who had breast cancer in her 17s, and multiple maternal aunts with cancer including breast cancer with at least one of them having breast cancer in her 65s.  Medical oncology was asked to see the patient for recommendations regarding her brain mass and lung lesion.  Past Medical History:  Diagnosis Date   Anxiety    COPD (chronic obstructive pulmonary disease) ([place])    Depression    High cholesterol    Hyperlipidemia    Myocardial infarction ([place]) 10/22/2014   "mild"   On home oxygen therapy    "2L; 24/7" (10/24/2014)   Pneumonia 04/2013  :   Past Surgical History:  Procedure Laterality Date   CARDIAC CATHETERIZATION  10/24/2014   LEFT HEART CATHETERIZATION WITH CORONARY ANGIOGRAM N/A 10/24/2014   Procedure: LEFT HEART CATHETERIZATION WITH CORONARY ANGIOGRAM;  Surgeon: Laverda Page, MD;  Location: [facility] CATH LAB;  Service: Cardiovascular;  Laterality: N/A;   TUBAL LIGATION  1980's  :   Current Facility-Administered Medications  Medication Dose Route Frequency Provider Last Rate Last Admin   0.9 %  sodium chloride infusion  250 mL Intravenous PRN Shahmehdi, Seyed A, MD       acetaminophen (TYLENOL) tablet 650 mg  650 mg Oral Q6H PRN Skipper Cliche A, MD   650 mg at 05/27/21 0800   Or   acetaminophen (TYLENOL) suppository 650 mg  650 mg Rectal Q6H PRN Shahmehdi, Seyed A, MD       bisacodyl (DULCOLAX) EC tablet 5 mg  5 mg Oral Daily PRN Shahmehdi, Seyed A, MD       cefTRIAXone (ROCEPHIN) 1 g in sodium chloride 0.9 % 100 mL IVPB  1 g Intravenous Q24H Shahmehdi, Seyed A, MD 200 mL/hr at 05/26/21 1223 1 g at 05/26/21 1223   citalopram (CELEXA) tablet 20 mg  20 mg Oral Daily Shahmehdi, Seyed A, MD   20 mg at 05/27/21 0800   dexamethasone (DECADRON) injection 4 mg  4 mg Intravenous Q6H Shahmehdi, Seyed A, MD   4 mg at 05/27/21 0801   heparin injection 5,000 Units  5,000 Units Subcutaneous Q8H Shahmehdi,  Seyed A, MD   5,000 Units at 05/27/21 9509   hydrALAZINE (APRESOLINE) injection 10 mg  10 mg Intravenous Q4H PRN Shahmehdi, Seyed A, MD       HYDROmorphone (DILAUDID) injection 0.5-1 mg  0.5-1 mg Intravenous Q2H PRN Shahmehdi, Seyed A, MD       ipratropium (ATROVENT) nebulizer solution 0.5 mg  0.5 mg Nebulization Q6H PRN Shahmehdi, Seyed A, MD       levalbuterol (XOPENEX) nebulizer solution 0.63 mg  0.63 mg Nebulization Q6H PRN Shahmehdi, Seyed A, MD   0.63 mg at 05/25/21 1010   levETIRAcetam (KEPPRA) tablet 250 mg  250 mg Oral BID Bhagat, Srishti L, MD   250 mg at 05/27/21 0800   LORazepam (ATIVAN) injection 0.5 mg  0.5 mg Intravenous Q6H PRN Deatra James, MD   0.5  mg at 05/26/21 1803   magnesium citrate solution 1 Bottle  1 Bottle Oral Once PRN Skipper Cliche A, MD       metoprolol tartrate (LOPRESSOR) tablet 12.5 mg  12.5 mg Oral BID Shahmehdi, Seyed A, MD   12.5 mg at 05/27/21 0800   multivitamin with minerals tablet 1 tablet  1 tablet Oral Daily Shahmehdi, Seyed A, MD   1 tablet at 05/27/21 0801   ondansetron (ZOFRAN) tablet 4 mg  4 mg Oral Q6H PRN Shahmehdi, Valeria Batman, MD       Or   ondansetron (ZOFRAN) injection 4 mg  4 mg Intravenous Q6H PRN Shahmehdi, Seyed A, MD       oxyCODONE (Oxy IR/ROXICODONE) immediate release tablet 5 mg  5 mg Oral Q4H PRN Shahmehdi, Seyed A, MD       senna-docusate (Senokot-S) tablet 1 tablet  1 tablet Oral QHS PRN Shahmehdi, Seyed A, MD       simvastatin (ZOCOR) tablet 20 mg  20 mg Oral q1800 Shahmehdi, Seyed A, MD   20 mg at 05/26/21 1804   sodium chloride flush (NS) 0.9 % injection 3 mL  3 mL Intravenous Q12H Shahmehdi, Seyed A, MD   3 mL at 05/27/21 0802   sodium chloride flush (NS) 0.9 % injection 3 mL  3 mL Intravenous Q12H Shahmehdi, Seyed A, MD   3 mL at 05/27/21 0802   sodium chloride flush (NS) 0.9 % injection 3 mL  3 mL Intravenous PRN Shahmehdi, Seyed A, MD       traZODone (DESYREL) tablet 25 mg  25 mg Oral QHS PRN Shahmehdi, Seyed A, MD           Allergies  Allergen Reactions   Sulfa Antibiotics Nausea And Vomiting  :  History reviewed. No pertinent family history.:   Social History   Socioeconomic History   Marital status: Widowed    Spouse name: Not on file   Number of children: Not on file   Years of education: Not on file   Highest education level: Not on file  Occupational History   Not on file  Tobacco Use   Smoking status: Former    Packs/day: 1.50    Years: 52.00    Pack years: 78.00    Types: Cigarettes    Quit date: 12/23/2012    Years since quitting: 8.4   Smokeless tobacco: Never  Substance and Sexual Activity   Alcohol use: No    Alcohol/week: 0.0 standard drinks   Drug use: No   Sexual activity: Never  Other Topics Concern   Not on file  Social History Narrative   Not on file   Social Determinants of Health   Financial Resource Strain: Not on file  Food Insecurity: Not on file  Transportation Needs: Not on file  Physical Activity: Not on file  Stress: Not on file  Social Connections: Not on file  Intimate Partner Violence: Not on file  :  Review of Systems: A comprehensive 14 point review of systems was negative except as noted in the HPI.  Exam: Patient Vitals for the past 24 hrs:  BP Temp Temp src Pulse Resp SpO2 Weight  05/27/21 0812 (!) 165/57 98.4 F (36.9 C) Oral 91 18 96 % --  05/27/21 0628 (!) 144/69 -- -- 74 -- -- --  05/27/21 0354 (!) 133/115 98.7 F (37.1 C) Oral 73 16 98 % --  05/27/21 0332 -- -- -- -- -- -- 74.9 kg  05/26/21 2338 Marland Kitchen)  142/60 98.7 F (37.1 C) Oral 69 18 100 % --  05/26/21 2118 (!) 156/66 98.3 F (36.8 C) Oral 81 18 100 % --  05/26/21 1547 (!) 149/57 98 F (36.7 C) Oral 79 20 100 % --  05/26/21 1125 134/67 97.9 F (36.6 C) Oral 77 20 100 % --    General: Awake and alert, no distress.. Eyes: PERRL, EOMI, no scleral icterus.   ENT:  There were no oropharyngeal lesions.  .   Lymphatics:  Negative cervical, supraclavicular, axillary, or  inguinal adenopathy.   Respiratory: lungs were clear bilaterally without wheezing or crackles.   Breasts: No palpable masses or nipple discharge. Cardiovascular:  Regular rate and rhythm, S1/S2, without murmur, rub or gallop.  There was no pedal edema.   GI:  abdomen was soft, flat, nontender, nondistended, without organomegaly.   Musculoskeletal: Strength symmetrical in the upper and lower extremities. Skin: Yeast noted in the skin folds below the breasts, abdominal skin folds, and groin Neuro exam was nonfocal. Patient was alert and oriented.  Attention was good.   Language was appropriate.  Mood was normal without depression.  Speech was not pressured.  Thought content was not tangential.     Lab Results  Component Value Date   WBC 6.0 05/27/2021   HGB 10.6 (L) 05/27/2021   HCT 34.1 (L) 05/27/2021   PLT 241 05/27/2021   GLUCOSE 137 (H) 05/27/2021   CHOL 121 10/23/2014   TRIG 73 10/23/2014   HDL 49 10/23/2014   LDLCALC 57 10/23/2014   ALT 14 05/25/2021   AST 34 05/25/2021   NA 140 05/27/2021   K 4.6 05/27/2021   CL 100 05/27/2021   CREATININE 0.79 05/27/2021   BUN 25 (H) 05/27/2021   CO2 34 (H) 05/27/2021    DG Chest 2 View  Result Date: 05/25/2021 CLINICAL DATA:  Weakness. EXAM: CHEST - 2 VIEW COMPARISON:  Chest x-ray report 03/05/2016. FINDINGS: Mediastinum and hilar structures normal. Heart size normal. Low lung volumes with mild bibasilar atelectasis. Mild left base infiltrate cannot be excluded. No pleural effusion or pneumothorax. IMPRESSION: Low lung volumes with mild bibasilar atelectasis. Mild left base infiltrate cannot be excluded. Electronically Signed   By: Marcello Moores  Register   On: 05/25/2021 06:49   MR BRAIN WO CONTRAST  Result Date: 05/25/2021 CLINICAL DATA:  Delirium, prior history of DCIS left breast EXAM: MRI HEAD WITHOUT CONTRAST TECHNIQUE: Multiplanar, multiecho pulse sequences of the brain and surrounding structures were obtained without intravenous contrast.  COMPARISON:  None. FINDINGS: Brain: There is a 2.7 x 2.7 cm rounded mass of the anteroinferior right frontal lobe with extensive surrounding edema. There is regional mass effect including leftward midline shift with subfalcine herniation measuring 1 cm. No significant central herniation. No evidence of acute infarction or hemorrhage. Additional patchy T2 hyperintensity in the supratentorial and pontine white matter is nonspecific but probably reflects chronic microvascular ischemic changes. There is no extra-axial collection. There is no hydrocephalus. Vascular: Major vessel flow voids at the skull base are preserved. Skull and upper cervical spine: Normal marrow signal is preserved. Sinuses/Orbits: Minor mucosal thickening.  Orbits are unremarkable. Other: Sella is unremarkable.  Mastoid air cells are clear. IMPRESSION: 2.7 cm anterior inferior right frontal lobe mass with extensive surrounding edema. Metastasis is the primary differential consideration. There is 1 cm of leftward midline shift. No hydrocephalus. Postcontrast imaging is recommended to evaluate for additional lesions. These results will be called to the ordering clinician or representative by the Radiologist Assistant, and  communication documented in the PACS or [employer]. Electronically Signed   By: Macy Mis M.D.   On: 05/25/2021 12:27   MR BRAIN W WO CONTRAST  Result Date: 05/26/2021 CLINICAL DATA:  Intracranial metastatic disease. Generalized weakness. EXAM: MRI HEAD WITHOUT AND WITH CONTRAST TECHNIQUE: Multiplanar, multiecho pulse sequences of the brain and surrounding structures were obtained without and with intravenous contrast. CONTRAST:  7.8m GADAVIST GADOBUTROL 1 MMOL/ML IV SOLN COMPARISON:  Brain MRI 05/25/2021 at 11:06 a.m. FINDINGS: Brain: Unchanged appearance of right frontal lesion with large amount of surrounding edema. Unchanged leftward midline shift of 8 mm. Right frontal mass shows heterogeneous contrast  enhancement and measures 3.2 x 2.6 cm. There are no other contrast-enhancing lesions. There is no acute or chronic hemorrhage. Normal white matter signal, parenchymal volume and CSF spaces. The midline structures are normal. Vascular: Major flow voids are preserved. Skull and upper cervical spine: Normal calvarium and skull base. Visualized upper cervical spine and soft tissues are normal. Sinuses/Orbits:No paranasal sinus fluid levels or advanced mucosal thickening. No mastoid or middle ear effusion. Normal orbits. IMPRESSION: 1. Right frontal mass measuring 3.2 x 2.6 cm, with large amount of surrounding edema and 8 mm leftward midline shift. 2. No other metastatic lesions. Electronically Signed   By: KUlyses JarredM.D.   On: 05/26/2021 00:04   EEG adult  Result Date: 05/26/2021 YLora Havens MD     05/26/2021 10:06 AM Patient Name: Barbara KolanderMRN: [id number] Attending: PLora HavensReferring Physician/Provider: Dr SLesleigh NoeDate: 05/26/2021 Duration: 23.51 mins Patient history: 75year old female with history of breast cancer now with brain mets presented with weakness.  Patient's daughter also reported episodes of left side shaking followed by weakness on the left side.  EEG to evaluate for seizures. Level of alertness: Awake AEDs during EEG study: Keppra Technical aspects: This EEG study was done with scalp electrodes positioned according to the 10-20 International system of electrode placement. Electrical activity was acquired at a sampling rate of 500Hz  and reviewed with a high frequency filter of 70Hz  and a low frequency filter of 1Hz . EEG data were recorded continuously and digitally stored. Description: The posterior dominant rhythm consists of 8-9 Hz activity of moderate voltage (25-35 uV) seen predominantly in posterior head regions, symmetric and reactive to eye opening and eye closing. EEG showed continuous rhythmic 2 to 3 Hz sharply contoured delta slowing in her right  frontal region. Hyperventilation and photic stimulation were not performed.   ABNORMALITY -Continuous rhythmic delta slow, right frontal region IMPRESSION: This study is suggestive of cortical dysfunction in the right frontal region likely secondary to underlying mass.  Additionally, rhythmic delta slowing in frontal region is on the ictal-interictal continuum with low potential for seizures.  No seizures or definite epileptiform discharges were seen throughout the recording. Priyanka OBarbra Sarks  CT CHEST ABDOMEN PELVIS WO CONTRAST  Result Date: 05/25/2021 CLINICAL DATA:  75year old female with evaluation for metastatic disease. EXAM: CT CHEST, ABDOMEN AND PELVIS WITHOUT CONTRAST TECHNIQUE: Multidetector CT imaging of the chest, abdomen and pelvis was performed following the standard protocol without IV contrast. COMPARISON:  None. FINDINGS: Evaluation of this exam is limited in the absence of intravenous contrast. CT CHEST FINDINGS Cardiovascular: There is no cardiomegaly or pericardial effusion. Three-vessel coronary vascular calcification. Advanced atherosclerotic calcification of the thoracic aorta. No aneurysmal dilatation. The central pulmonary arteries are grossly unremarkable. Mediastinum/Nodes: No hilar or mediastinal adenopathy. The esophagus is grossly unremarkable. Enlarged thyroid gland with multiple nodules,  likely multinodular goiter. No mediastinal fluid collection. Lungs/Pleura: Background of emphysema. There is a 1.7 x 1.0 cm left upper lobe subpleural nodule with irregular margins suspicious for a neoplastic process (32/7). Correlation with history of known malignancy and/or multidisciplinary consult and further evaluation with PET-CT is recommended. There is a 7 mm nodule with irregular margins in the left upper lobe (21/7). Apparent small nodular densities in the right upper lobe likely confluence of interstitial markings. There is however a 3 mm right upper lobe nodule (30/7). No lobar  consolidation, pleural effusion, or pneumothorax. The central airways are patent. Musculoskeletal: Old healed right posterior rib fractures. No acute osseous pathology. Osteopenia with degenerative changes of the spine. CT ABDOMEN PELVIS FINDINGS No intra-abdominal free air or free fluid. Hepatobiliary: The liver is unremarkable. No intrahepatic biliary dilatation. The gallbladder is unremarkable. Pancreas: Unremarkable. No pancreatic ductal dilatation or surrounding inflammatory changes. Spleen: Normal in size without focal abnormality. Adrenals/Urinary Tract: The adrenal glands are unremarkable. There is a 2 mm nonobstructing right renal upper pole calculus. No hydronephrosis. The left kidney is unremarkable. The visualized ureters and urinary bladder are unremarkable. Stomach/Bowel: There is severe sigmoid diverticulosis without active inflammatory changes. There is no bowel obstruction or active inflammation. The appendix is normal. Vascular/Lymphatic: Advanced aortoiliac atherosclerotic disease. The IVC is unremarkable. No portal venous gas. There is no adenopathy. Reproductive: The uterus and ovaries are grossly unremarkable. No adnexal masses. Other: None Musculoskeletal: Osteopenia with degenerative changes of the spine. No acute osseous pathology. IMPRESSION: 1. Left upper lobe 1.7 x 1.0 cm subpleural nodule suspicious for a neoplastic process. Additional smaller pulmonary nodules as above. Correlation with history of known malignancy or multidisciplinary consult and further evaluation with PET-CT is recommended. 2. No evidence of metastatic disease in the abdomen and pelvis. 3. Severe sigmoid diverticulosis. No bowel obstruction. Normal appendix. 4. Multinodular goiter. 5. Aortic Atherosclerosis (ICD10-I70.0) and Emphysema (ICD10-J43.9). Electronically Signed   By: Anner Crete M.D.   On: 05/25/2021 23:53     DG Chest 2 View  Result Date: 05/25/2021 CLINICAL DATA:  Weakness. EXAM: CHEST - 2 VIEW  COMPARISON:  Chest x-ray report 03/05/2016. FINDINGS: Mediastinum and hilar structures normal. Heart size normal. Low lung volumes with mild bibasilar atelectasis. Mild left base infiltrate cannot be excluded. No pleural effusion or pneumothorax. IMPRESSION: Low lung volumes with mild bibasilar atelectasis. Mild left base infiltrate cannot be excluded. Electronically Signed   By: Marcello Moores  Register   On: 05/25/2021 06:49   MR BRAIN WO CONTRAST  Result Date: 05/25/2021 CLINICAL DATA:  Delirium, prior history of DCIS left breast EXAM: MRI HEAD WITHOUT CONTRAST TECHNIQUE: Multiplanar, multiecho pulse sequences of the brain and surrounding structures were obtained without intravenous contrast. COMPARISON:  None. FINDINGS: Brain: There is a 2.7 x 2.7 cm rounded mass of the anteroinferior right frontal lobe with extensive surrounding edema. There is regional mass effect including leftward midline shift with subfalcine herniation measuring 1 cm. No significant central herniation. No evidence of acute infarction or hemorrhage. Additional patchy T2 hyperintensity in the supratentorial and pontine white matter is nonspecific but probably reflects chronic microvascular ischemic changes. There is no extra-axial collection. There is no hydrocephalus. Vascular: Major vessel flow voids at the skull base are preserved. Skull and upper cervical spine: Normal marrow signal is preserved. Sinuses/Orbits: Minor mucosal thickening.  Orbits are unremarkable. Other: Sella is unremarkable.  Mastoid air cells are clear. IMPRESSION: 2.7 cm anterior inferior right frontal lobe mass with extensive surrounding edema. Metastasis is the primary differential  consideration. There is 1 cm of leftward midline shift. No hydrocephalus. Postcontrast imaging is recommended to evaluate for additional lesions. These results will be called to the ordering clinician or representative by the Radiologist Assistant, and communication documented in the PACS or  [employer]. Electronically Signed   By: Macy Mis M.D.   On: 05/25/2021 12:27   MR BRAIN W WO CONTRAST  Result Date: 05/26/2021 CLINICAL DATA:  Intracranial metastatic disease. Generalized weakness. EXAM: MRI HEAD WITHOUT AND WITH CONTRAST TECHNIQUE: Multiplanar, multiecho pulse sequences of the brain and surrounding structures were obtained without and with intravenous contrast. CONTRAST:  7.72m GADAVIST GADOBUTROL 1 MMOL/ML IV SOLN COMPARISON:  Brain MRI 05/25/2021 at 11:06 a.m. FINDINGS: Brain: Unchanged appearance of right frontal lesion with large amount of surrounding edema. Unchanged leftward midline shift of 8 mm. Right frontal mass shows heterogeneous contrast enhancement and measures 3.2 x 2.6 cm. There are no other contrast-enhancing lesions. There is no acute or chronic hemorrhage. Normal white matter signal, parenchymal volume and CSF spaces. The midline structures are normal. Vascular: Major flow voids are preserved. Skull and upper cervical spine: Normal calvarium and skull base. Visualized upper cervical spine and soft tissues are normal. Sinuses/Orbits:No paranasal sinus fluid levels or advanced mucosal thickening. No mastoid or middle ear effusion. Normal orbits. IMPRESSION: 1. Right frontal mass measuring 3.2 x 2.6 cm, with large amount of surrounding edema and 8 mm leftward midline shift. 2. No other metastatic lesions. Electronically Signed   By: KUlyses JarredM.D.   On: 05/26/2021 00:04   EEG adult  Result Date: 05/26/2021 YLora Havens MD     05/26/2021 10:06 AM Patient Name: FWilhemenia CambaMRN: [id number] Attending: PLora HavensReferring Physician/Provider: Dr SLesleigh NoeDate: 05/26/2021 Duration: 23.51 mins Patient history: 75year old female with history of breast cancer now with brain mets presented with weakness.  Patient's daughter also reported episodes of left side shaking followed by weakness on the left side.  EEG to evaluate for seizures. Level  of alertness: Awake AEDs during EEG study: Keppra Technical aspects: This EEG study was done with scalp electrodes positioned according to the 10-20 International system of electrode placement. Electrical activity was acquired at a sampling rate of 500Hz  and reviewed with a high frequency filter of 70Hz  and a low frequency filter of 1Hz . EEG data were recorded continuously and digitally stored. Description: The posterior dominant rhythm consists of 8-9 Hz activity of moderate voltage (25-35 uV) seen predominantly in posterior head regions, symmetric and reactive to eye opening and eye closing. EEG showed continuous rhythmic 2 to 3 Hz sharply contoured delta slowing in her right frontal region. Hyperventilation and photic stimulation were not performed.   ABNORMALITY -Continuous rhythmic delta slow, right frontal region IMPRESSION: This study is suggestive of cortical dysfunction in the right frontal region likely secondary to underlying mass.  Additionally, rhythmic delta slowing in frontal region is on the ictal-interictal continuum with low potential for seizures.  No seizures or definite epileptiform discharges were seen throughout the recording. Priyanka OBarbra Sarks  CT CHEST ABDOMEN PELVIS WO CONTRAST  Result Date: 05/25/2021 CLINICAL DATA:  75year old female with evaluation for metastatic disease. EXAM: CT CHEST, ABDOMEN AND PELVIS WITHOUT CONTRAST TECHNIQUE: Multidetector CT imaging of the chest, abdomen and pelvis was performed following the standard protocol without IV contrast. COMPARISON:  None. FINDINGS: Evaluation of this exam is limited in the absence of intravenous contrast. CT CHEST FINDINGS Cardiovascular: There is no cardiomegaly or pericardial effusion. Three-vessel coronary  vascular calcification. Advanced atherosclerotic calcification of the thoracic aorta. No aneurysmal dilatation. The central pulmonary arteries are grossly unremarkable. Mediastinum/Nodes: No hilar or mediastinal adenopathy.  The esophagus is grossly unremarkable. Enlarged thyroid gland with multiple nodules, likely multinodular goiter. No mediastinal fluid collection. Lungs/Pleura: Background of emphysema. There is a 1.7 x 1.0 cm left upper lobe subpleural nodule with irregular margins suspicious for a neoplastic process (32/7). Correlation with history of known malignancy and/or multidisciplinary consult and further evaluation with PET-CT is recommended. There is a 7 mm nodule with irregular margins in the left upper lobe (21/7). Apparent small nodular densities in the right upper lobe likely confluence of interstitial markings. There is however a 3 mm right upper lobe nodule (30/7). No lobar consolidation, pleural effusion, or pneumothorax. The central airways are patent. Musculoskeletal: Old healed right posterior rib fractures. No acute osseous pathology. Osteopenia with degenerative changes of the spine. CT ABDOMEN PELVIS FINDINGS No intra-abdominal free air or free fluid. Hepatobiliary: The liver is unremarkable. No intrahepatic biliary dilatation. The gallbladder is unremarkable. Pancreas: Unremarkable. No pancreatic ductal dilatation or surrounding inflammatory changes. Spleen: Normal in size without focal abnormality. Adrenals/Urinary Tract: The adrenal glands are unremarkable. There is a 2 mm nonobstructing right renal upper pole calculus. No hydronephrosis. The left kidney is unremarkable. The visualized ureters and urinary bladder are unremarkable. Stomach/Bowel: There is severe sigmoid diverticulosis without active inflammatory changes. There is no bowel obstruction or active inflammation. The appendix is normal. Vascular/Lymphatic: Advanced aortoiliac atherosclerotic disease. The IVC is unremarkable. No portal venous gas. There is no adenopathy. Reproductive: The uterus and ovaries are grossly unremarkable. No adnexal masses. Other: None Musculoskeletal: Osteopenia with degenerative changes of the spine. No acute osseous  pathology. IMPRESSION: 1. Left upper lobe 1.7 x 1.0 cm subpleural nodule suspicious for a neoplastic process. Additional smaller pulmonary nodules as above. Correlation with history of known malignancy or multidisciplinary consult and further evaluation with PET-CT is recommended. 2. No evidence of metastatic disease in the abdomen and pelvis. 3. Severe sigmoid diverticulosis. No bowel obstruction. Normal appendix. 4. Multinodular goiter. 5. Aortic Atherosclerosis (ICD10-I70.0) and Emphysema (ICD10-J43.9). Electronically Signed   By: Anner Crete M.D.   On: 05/25/2021 23:53    Assessment and Plan:  1.  Left upper lobe lung lesion concerning for neoplastic process -CT chest/abdomen/pelvis without contrast 05/25/2021-left upper lobe 1.7 x 1.0 cm subpleural nodule suspicious for neoplastic process 2.  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 3.  Focal seizures 4.  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  AKI 6.  COPD with chronic hypoxic respiratory failure 7.  CAD 8.  Hyperlipidemia 9.  History of MI 10.  Depression 11.  Anxiety 12.  History of tobacco dependence 13.  Normocytic anemia 14.  Candida in skin folds  Ms. Barbara Gibson presented to the hospital with progressive weakness for at least the past 2 months.  MRI on admission showed a right frontal mass which may represent a metastatic deposit.  The patient has been seen by neurosurgery with plans for surgical resection next week as she needs to be off Plavix x7 days.  She is on dexamethasone and has been started on Keppra.  CT chest, abdomen, pelvis results  have been discussed with the patient and her daughters.  She was found to have a left upper lobe lung nodule which is suspicious for neoplasm.  She does have a strong history of smoking. Lung nodule concerning for primary lung cancer.  Agree with proceeding with surgical resection which will provide a biopsy.  We plan to follow-up on the surgical pathology results.  Discussed with the patient that her brain mass could represent a primary CNS tumor versus metastasis.  Again, metastatic lung cancer a possibility given her smoking history.  Recurrent, metastatic breast cancer thought to be less likely.  Recommendations:  1.  Proceed with resection of her brain tumor per neurosurgery. 2.  We will follow-up on the surgical path results. 3.  Nystatin powder ordered for fungal rash in skin folds.  Thank you for this referral.   Mikey Bussing, DNP, AGPCNP-BC, AOCNP   Ms. Schou was interviewed and examined.  I reviewed the CT and the MRI images.  She presents with failure to thrive, likely related to a right frontal mass.  We discussed the differential diagnosis with her including a benign lesion such as meningioma, a metastasis, and a primary brain tumor.  I agree with the plan for surgical resection for symptom management and to establish a diagnosis.  It is unlikely the brain mass is related to the history of left breast DCIS.  We will follow-up on the pathology from the brain resection and make treatment recommendations.  She indicates that she will plan to follow-up with her oncologist in the evening.  I was present for greater than 50% of today's visit.  I performed medical decision making.  Julieanne Manson, MD

## 2021-05-27 NOTE — NC FL2 (Signed)
[place] LEVEL OF CARE SCREENING TOOL     IDENTIFICATION  Patient Name: Barbara Gibson Birthdate: [birth year] Sex: female Admission Date (Current Location): 05/25/2021  [facility] and [place] Number:  [employer] and Address:  [facility] [place], [place] [address] [place], [place] [address]      Provider Number: [id number]  Attending Physician Name and Address:  Elgergawy, Silver Huguenin, MD  Relative Name and Phone Number:       Current Level of Care: Hospital Recommended Level of Care: [place] Prior Approval Number:    Date Approved/Denied:   PASRR Number: [id number]  Discharge Plan: SNF    Current Diagnoses: Patient Active Problem List   Diagnosis Date Noted   AKI (acute kidney injury) ([place]) 05/25/2021   Weakness 05/25/2021   Acute lower UTI 05/25/2021   Dehydration 05/25/2021   Brain mass 05/25/2021   Encephalopathy 05/25/2021   Osteopenia 03/31/2016   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   Coronary artery disease involving native coronary artery of native heart without angina pectoris 03/31/2016   NSTEMI, initial episode of care [facility]) 10/23/2014    Orientation RESPIRATION BLADDER Height & Weight     Self, Time, Place  O2 ([place] 2L) Incontinent Weight: 165 lb 2 oz (74.9 kg) Height:     BEHAVIORAL SYMPTOMS/MOOD NEUROLOGICAL BOWEL NUTRITION STATUS      Continent Diet (see DC summary)  AMBULATORY STATUS COMMUNICATION OF NEEDS Skin   Limited Assist Verbally Surgical wounds                       Personal Care Assistance Level of Assistance  Bathing, Feeding, Dressing Bathing Assistance: Limited assistance Feeding assistance: Limited assistance Dressing Assistance: Limited assistance     Functional Limitations Info  Sight Sight Info: Impaired        SPECIAL CARE FACTORS FREQUENCY  PT (By licensed PT), OT (By licensed OT)     PT Frequency: 5x/wk OT Frequency: 5x/wk            Contractures  Contractures Info: Not present    Additional Factors Info  Code Status, Allergies, Psychotropic Code Status Info: Full Allergies Info: Sulfa Antibiotics Psychotropic Info: Celexa 20mg  daily         Current Medications (05/27/2021):  This is the current hospital active medication list Current Facility-Administered Medications  Medication Dose Route Frequency Provider Last Rate Last Admin   0.9 %  sodium chloride infusion  250 mL Intravenous PRN Shahmehdi, Seyed A, MD       acetaminophen (TYLENOL) tablet 650 mg  650 mg Oral Q6H PRN Skipper Cliche A, MD   650 mg at 05/27/21 0800   Or   acetaminophen (TYLENOL) suppository 650 mg  650 mg Rectal Q6H PRN Shahmehdi, Seyed A, MD       bisacodyl (DULCOLAX) EC tablet 5 mg  5 mg Oral Daily PRN Shahmehdi, Seyed A, MD       cefTRIAXone (ROCEPHIN) 1 g in sodium chloride 0.9 % 100 mL IVPB  1 g Intravenous Q24H Shahmehdi, Seyed A, MD 200 mL/hr at 05/26/21 1223 1 g at 05/26/21 1223   citalopram (CELEXA) tablet 20 mg  20 mg Oral Daily Shahmehdi, Seyed A, MD   20 mg at 05/27/21 0800   dexamethasone (DECADRON) injection 4 mg  4 mg Intravenous Q6H Shahmehdi, Seyed A, MD   4 mg at 05/27/21 0801   heparin injection 5,000 Units  5,000 Units Subcutaneous Q8H Shahmehdi, Seyed  A, MD   5,000 Units at 05/27/21 0526   hydrALAZINE (APRESOLINE) injection 10 mg  10 mg Intravenous Q4H PRN Shahmehdi, Seyed A, MD       HYDROmorphone (DILAUDID) injection 0.5-1 mg  0.5-1 mg Intravenous Q2H PRN Shahmehdi, Seyed A, MD       ipratropium (ATROVENT) nebulizer solution 0.5 mg  0.5 mg Nebulization Q6H PRN Shahmehdi, Seyed A, MD       levalbuterol (XOPENEX) nebulizer solution 0.63 mg  0.63 mg Nebulization Q6H PRN Shahmehdi, Seyed A, MD   0.63 mg at 05/25/21 1010   levETIRAcetam (KEPPRA) tablet 250 mg  250 mg Oral BID Bhagat, Srishti L, MD   250 mg at 05/27/21 0800   LORazepam (ATIVAN) injection 0.5 mg  0.5 mg Intravenous Q6H PRN Skipper Cliche A, MD   0.5 mg at 05/26/21 1803    magnesium citrate solution 1 Bottle  1 Bottle Oral Once PRN Skipper Cliche A, MD       metoprolol tartrate (LOPRESSOR) tablet 12.5 mg  12.5 mg Oral BID Shahmehdi, Seyed A, MD   12.5 mg at 05/27/21 0800   multivitamin with minerals tablet 1 tablet  1 tablet Oral Daily Shahmehdi, Seyed A, MD   1 tablet at 05/27/21 0801   ondansetron (ZOFRAN) tablet 4 mg  4 mg Oral Q6H PRN Shahmehdi, Seyed A, MD       Or   ondansetron (ZOFRAN) injection 4 mg  4 mg Intravenous Q6H PRN Shahmehdi, Seyed A, MD       oxyCODONE (Oxy IR/ROXICODONE) immediate release tablet 5 mg  5 mg Oral Q4H PRN Shahmehdi, Seyed A, MD       senna-docusate (Senokot-S) tablet 1 tablet  1 tablet Oral QHS PRN Shahmehdi, Seyed A, MD       simvastatin (ZOCOR) tablet 20 mg  20 mg Oral q1800 Shahmehdi, Seyed A, MD   20 mg at 05/26/21 1804   sodium chloride flush (NS) 0.9 % injection 3 mL  3 mL Intravenous Q12H Shahmehdi, Seyed A, MD   3 mL at 05/27/21 0802   sodium chloride flush (NS) 0.9 % injection 3 mL  3 mL Intravenous Q12H Shahmehdi, Seyed A, MD   3 mL at 05/27/21 0802   sodium chloride flush (NS) 0.9 % injection 3 mL  3 mL Intravenous PRN Shahmehdi, Seyed A, MD       traZODone (DESYREL) tablet 25 mg  25 mg Oral QHS PRN Deatra James, MD         Discharge Medications: Please see discharge summary for a list of discharge medications.  Relevant Imaging Results:  Relevant Lab Results:   Additional Information SS#: [id number]  Geralynn Ochs, LCSW

## 2021-05-27 NOTE — Progress Notes (Signed)
   Providing Compassionate, Quality Care - Together  NEUROSURGERY PROGRESS NOTE   S: No issues overnight. No new complaints  O: EXAM:  BP (!) 165/57 (BP Location: Right Arm)   Pulse 91   Temp 98.4 F (36.9 C) (Oral)   Resp 18   Wt 74.9 kg   SpO2 96%   BMI 26.65 kg/m   Awake, alert, oriented x3, ? Hallucinations overnight PERRLA EOMI Speech fluent, appropriate  CNs grossly intact  5/5 BUE/BLE  No drift  ASSESSMENT:  75 y.o. female with    Right frontal anterior skull base mass  -Possible meningioma versus metastasis  PLAN: -Planned surgical resection next week, needs to be off Plavix x7 days -Continue PT/OT -DVT and seizure prophylaxis -I have discussed with the patient and both daughters at bedside, they agreed with surgical intervention at this time    Thank you for allowing me to participate in this patient's care.  Please do not hesitate to call with questions or concerns.   Elwin Sleight, [facility] Cell: [phone]

## 2021-05-28 ENCOUNTER — Inpatient Hospital Stay (HOSPITAL_COMMUNITY): Payer: Medicare Other

## 2021-05-28 DIAGNOSIS — N390 Urinary tract infection, site not specified: Secondary | ICD-10-CM | POA: Diagnosis not present

## 2021-05-28 LAB — CBC
HCT: 37 % (ref 36.0–46.0)
Hemoglobin: 11.7 g/dL — ABNORMAL LOW (ref 12.0–15.0)
MCH: 28.8 pg (ref 26.0–34.0)
MCHC: 31.6 g/dL (ref 30.0–36.0)
MCV: 91.1 fL (ref 80.0–100.0)
Platelets: 224 10*3/uL (ref 150–400)
RBC: 4.06 MIL/uL (ref 3.87–5.11)
RDW: 12.5 % (ref 11.5–15.5)
WBC: 8.3 10*3/uL (ref 4.0–10.5)
nRBC: 0 % (ref 0.0–0.2)

## 2021-05-28 LAB — BASIC METABOLIC PANEL
Anion gap: 7 (ref 5–15)
BUN: 21 mg/dL (ref 8–23)
CO2: 37 mmol/L — ABNORMAL HIGH (ref 22–32)
Calcium: 9.5 mg/dL (ref 8.9–10.3)
Chloride: 95 mmol/L — ABNORMAL LOW (ref 98–111)
Creatinine, Ser: 0.73 mg/dL (ref 0.44–1.00)
GFR, Estimated: >60 mL/min (ref >60)
Glucose, Bld: 136 mg/dL — ABNORMAL HIGH (ref 70–99)
Potassium: 4.3 mmol/L (ref 3.5–5.1)
Sodium: 139 mmol/L (ref 135–145)

## 2021-05-28 LAB — GLUCOSE, CAPILLARY: Glucose-Capillary: 117 mg/dL — ABNORMAL HIGH (ref 70–99)

## 2021-05-28 MED ORDER — METHYLPREDNISOLONE SODIUM SUCC 125 MG IJ SOLR: 80.0000 mg | Freq: Three times a day (TID) | INTRAMUSCULAR | Status: DC

## 2021-05-28 MED ORDER — METHYLPREDNISOLONE SODIUM SUCC 125 MG IJ SOLR
80.0000 mg | Freq: Once | INTRAMUSCULAR | Status: AC
  Administered 2021-05-28: 80 mg via INTRAVENOUS
  Filled 2021-05-28: qty 2

## 2021-05-28 NOTE — Progress Notes (Signed)
PROGRESS NOTE    Barbara Gibson  YBO:[id number] DOB: November 10, 1946 DOA: 05/25/2021 PCP: [name], MD    Chief Complaint  Patient presents with   Weakness    Brief Narrative:   Barbara Gibson is a 75 y.o. female with medical history significant of anxiety, COPD, depression, hyperlipidemia, CAD, history of MI, former smoker, history of recent UTI.  History of breast cancer status post lumpectomy, few years ago no radiation or chemo given her poor general health presenting with generalized weaknesses x3 days.  Her work-up significant for brain mass, likely meningioma per neurosurgery.  Assessment & Plan:   Principal Problem:   Brain mass Active Problems:   Panlobular emphysema (HCC)   Coronary artery disease involving native coronary artery of native heart without angina pectoris   AKI (acute kidney injury) ([place])   Weakness   Acute lower UTI   Dehydration   Encephalopathy   New brain mass with edema/encephalopathy -Possible meningioma versus metastasis MRI of the brain revealed 2.7 cm anterior inferior right frontal lobe mass with extensive surrounding edema.  -Neurosurgery input greatly appreciated, findings concerning for meningioma, as well primary malignancy versus metastasis cannot be ruled out, so recommendation is for [facility] intervention, for resection, where as well will be able to obtain biopsy to rule out malignancy, meanwhile continue with IV Decadron given evidence of edema and midline shift. -Neurology input greatly appreciated, EEG unremarkable for seizures, but has rhythmic delta slowing in the right frontal region, for now continue with Keppra 250 mg p.o. twice daily, if she has any breakthrough seizures activity, then it can be increased to 500 mg p.o. twice daily  History of breast cancer -Status post lumpectomy, no radiation, CT chest/abdomen/pelvis significant for left upper lobe 1.7 x 1 cm subpleural nodule, and smaller pulmonary nodules, have consulted  oncology regarding further recommendations.   AKI (acute kidney injury) ([place]) -Solved with IV fluids, she is with some trace edema today, I will stop her fluids and give low dose of lasix.   Chronic hypoxic respiratory failure - At baseline, on 4 L nasal cannula   lower UTI  -treated with Rocephin   Altered, confused due to acute metabolic encephalopathy  - in the setting of brain mass and UTI -Improving with IV steroids and antibiotics, daughter at bedside reports she is not back to her baseline  COPD/emphysema ([place]) - -As needed DuoNeb, bronchodilators, supplemental oxygen as needed -Patient reports some dyspnea today, chest x-ray with no acute findings, she is at baseline on 4 L, I will give 1 dose of IV Solu-Medrol today.  (Since she was already on scheduled Decadron).   Coronary artery disease involving native coronary artery of native heart without angina pectoris -Resume home medication including metoprolol, statins, will hold Plavix in case she needs neurosurgery   Hyperlipidemia -continue home medication of simvastatin,   Weakness -PT OT, fall precaution  DVT prophylaxis: [place] heaprin Code Status: Full Family Communication: None at bedside Disposition:   Status is: inpatient  The patient will require care spanning > 2 midnights and should be moved to inpatient because: IV treatments appropriate due to intensity of illness or inability to take PO  Dispo: The patient is from: Home              Anticipated d/c is to: Home              Patient currently is not medically stable to d/c.   Difficult to place patient No  Consultants:  Neurosurgery Oncology   Subjective:  A, she denies any chest pain, she reports neurolysed weakness and fatigue.  Objective: Vitals:   05/28/21 0500 05/28/21 0749 05/28/21 1203 05/28/21 1318  BP:  (!) 140/93 (!) 176/66   Pulse:  83 83   Resp:  20 18   Temp:  98.7 F (37.1 C) (!) 97.4 F (36.3 C)   TempSrc:   Oral   SpO2:   100% 100% 97%  Weight: 73.6 kg       Intake/Output Summary (Last 24 hours) at 05/28/2021 1407 Last data filed at 05/27/2021 1800 Gross per 24 hour  Intake 320 ml  Output 600 ml  Net -280 ml   Filed Weights   05/27/21 0332 05/28/21 0500  Weight: 74.9 kg 73.6 kg    Examination:  Awake Alert, Oriented X 3, frail, deconditioned Symmetrical Chest wall movement, air entry at the bases, but no wheezing. RRR,No Gallops,Rubs or new Murmurs, No Parasternal Heave +ve B.Sounds, Abd Soft, No tenderness, No rebound - guarding or rigidity. No Cyanosis, Clubbing or edema, No new Rash or bruise        Data Reviewed: I have personally reviewed following labs and imaging studies  CBC: Recent Labs  Lab 05/25/21 0603 05/26/21 1113 05/27/21 0602 05/28/21 0330  WBC 8.8 5.7 6.0 8.3  NEUTROABS 7.4  --   --   --   HGB 13.7 11.9* 10.6* 11.7*  HCT 44.2 37.7 34.1* 37.0  MCV 95.5 91.1 91.2 91.1  PLT 282 258 241 462    Basic Metabolic Panel: Recent Labs  Lab 05/25/21 0603 05/26/21 1113 05/27/21 0602 05/28/21 0330  NA 135 136 140 139  K 4.4 4.2 4.6 4.3  CL 95* 95* 100 95*  CO2 35* 33* 34* 37*  GLUCOSE 122* 206* 137* 136*  BUN 39* 30* 25* 21  CREATININE 1.76* 1.18* 0.79 0.73  CALCIUM 9.2 9.6 9.0 9.5    GFR: CrCl cannot be calculated (Unknown ideal weight.).  Liver Function Tests: Recent Labs  Lab 05/25/21 0603  AST 34  ALT 14  ALKPHOS 56  BILITOT 0.7  PROT 7.3  ALBUMIN 4.2    CBG: Recent Labs  Lab 05/25/21 0815 05/26/21 0755 05/27/21 0627 05/28/21 0620  GLUCAP 101* 120* 141* 117*     Recent Results (from the past 240 hour(s))  Resp Panel by RT-PCR (Flu A&B, Covid) Nasopharyngeal Swab     Status: None   Collection Time: 05/25/21  7:13 AM   Specimen: Nasopharyngeal Swab; Nasopharyngeal(NP) swabs in vial transport medium  Result Value Ref Range Status   SARS Coronavirus 2 by RT PCR NEGATIVE NEGATIVE Final    Comment: (NOTE) SARS-CoV-2 target nucleic acids are  NOT DETECTED.  The SARS-CoV-2 RNA is generally detectable in upper respiratory specimens during the acute phase of infection. The lowest concentration of SARS-CoV-2 viral copies this assay can detect is 138 copies/mL. A negative result does not preclude SARS-Cov-2 infection and should not be used as the sole basis for treatment or other patient management decisions. A negative result may occur with  improper specimen collection/handling, submission of specimen other than nasopharyngeal swab, presence of viral mutation(s) within the areas targeted by this assay, and inadequate number of viral copies(<138 copies/mL). A negative result must be combined with clinical observations, patient history, and epidemiological information. The expected result is Negative.  Fact Sheet for Patients:  EntrepreneurPulse.com.au  Fact Sheet for Healthcare Providers:  IncredibleEmployment.be  This test is no t yet approved or cleared  by the [place] and  has been authorized for detection and/or diagnosis of SARS-CoV-2 by FDA under an Emergency Use Authorization (EUA). This EUA will remain  in effect (meaning this test can be used) for the duration of the COVID-19 declaration under Section 564(b)(1) of the Act, 21 U.S.C.section 360bbb-3(b)(1), unless the authorization is terminated  or revoked sooner.       Influenza A by PCR NEGATIVE NEGATIVE Final   Influenza B by PCR NEGATIVE NEGATIVE Final    Comment: (NOTE) The Xpert Xpress SARS-CoV-2/FLU/RSV plus assay is intended as an aid in the diagnosis of influenza from Nasopharyngeal swab specimens and should not be used as a sole basis for treatment. Nasal washings and aspirates are unacceptable for Xpert Xpress SARS-CoV-2/FLU/RSV testing.  Fact Sheet for Patients: EntrepreneurPulse.com.au  Fact Sheet for Healthcare Providers: IncredibleEmployment.be  This test is not  yet approved or cleared by the [place] FDA and has been authorized for detection and/or diagnosis of SARS-CoV-2 by FDA under an Emergency Use Authorization (EUA). This EUA will remain in effect (meaning this test can be used) for the duration of the COVID-19 declaration under Section 564(b)(1) of the Act, 21 U.S.C. section 360bbb-3(b)(1), unless the authorization is terminated or revoked.  Performed at [facility], [address], [place], [place] [address]          Radiology Studies: [facility] Chest [facility] 1 View  Result Date: 05/28/2021 CLINICAL DATA:  Shortness of breath. EXAM: PORTABLE CHEST 1 VIEW COMPARISON:  Chest x-ray dated May 25, 2021. FINDINGS: Normal heart size. Unchanged upper mediastinal contour abnormality related to known multinodular thyroid goiter. Chronically coarsened interstitial markings with emphysematous changes. No focal consolidation, pleural effusion, or pneumothorax. No acute osseous abnormality. Old right-sided rib fractures again noted. IMPRESSION: 1. No active disease. 2. COPD. Electronically Signed   By: Titus Dubin M.D.   On: 05/28/2021 12:47        Scheduled Meds:  citalopram  20 mg Oral Daily   dexamethasone (DECADRON) injection  4 mg Intravenous Q6H   heparin  5,000 Units Subcutaneous Q8H   levETIRAcetam  250 mg Oral BID   metoprolol tartrate  12.5 mg Oral BID   multivitamin with minerals  1 tablet Oral Daily   nystatin  1 application Topical BID   pantoprazole  40 mg Oral Daily   simvastatin  20 mg Oral q1800   sodium chloride flush  3 mL Intravenous Q12H   sodium chloride flush  3 mL Intravenous Q12H   Continuous Infusions:  sodium chloride     cefTRIAXone (ROCEPHIN)  IV 1 g (05/28/21 1253)     LOS: 2 days      Phillips Climes, MD [facility]   To contact the attending provider between 7A-7P or the covering provider during after hours 7P-7A, please log into the web site [email or web address] and access using universal [facility] password for that web site. If you do not have the password, please call the hospital operator.  05/28/2021, 2:07 PM

## 2021-05-28 NOTE — Plan of Care (Signed)

## 2021-05-28 NOTE — Progress Notes (Signed)
Physical Therapy Treatment Patient Details Name: Barbara Gibson MRN: [id number] DOB: 20-Jun-1946 Today's Date: 05/28/2021    History of Present Illness 75 y/o female presented to [facility] on 6/28 with generalized weakness x 10 days, difficulty with executive functioning, and headache. T/f to [facility]. 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 PMH: COPD, anxiety, depression, HLD, MI in 2015    PT Comments    Patient reporting feeling "like I'm gasping for air" with spO2 >95%. Instructed patient on deep breathing techniques. Patient performed seated exercises focusing on LE strengthening. Patient performed sit to stand and stand pivot transfer with minA and [name]. Continue to recommend SNF for ongoing Physical Therapy.      Follow Up Recommendations  SNF;Supervision/Assistance - 24 hour     Equipment Recommendations  Rolling [employer] with 5" wheels;3in1 (PT)    Recommendations for Other Services       Precautions / Restrictions Precautions Precautions: Fall Precaution Comments: 4L O2 @ baseline Restrictions Weight Bearing Restrictions: No    Mobility  Bed Mobility Overal bed mobility: Needs Assistance Bed Mobility: Supine to Sit     Supine to sit: Min guard     General bed mobility comments: min guard for safety    Transfers Overall transfer level: Needs assistance Equipment used: 1 person hand held assist Transfers: Sit to/from [facility] Sit to Stand: Min assist Stand pivot transfers: Min assist       General transfer comment: flexed posture throughout. MinA for balance to steady  Ambulation/Gait                 Stairs             Wheelchair Mobility    Modified Rankin (Stroke Patients Only)       Balance Overall balance assessment: Needs assistance Sitting-balance support: Feet supported Sitting balance-Leahy Scale: Fair     Standing balance  support: Single extremity supported Standing balance-Leahy Scale: Poor Standing balance comment: needing external assist                            Cognition Arousal/Alertness: Awake/alert Behavior During Therapy: Flat affect Overall Cognitive Status: Impaired/Different from baseline Area of Impairment: Orientation;Attention;Following commands;Safety/judgement;Problem solving;Memory                   Current Attention Level: Sustained Memory: Decreased short-term memory Following Commands: Follows one step commands with increased time Safety/Judgement: Decreased awareness of safety   Problem Solving: Slow processing;Decreased initiation;Requires verbal cues;Requires tactile cues General Comments: Needing increased time for orientation questions. Decreased awareness of safety with leaving RW behind with mobility      Exercises General Exercises - Lower Extremity Long Arc Quad: Both;10 reps;Seated Hip Flexion/Marching: Both;10 reps;Seated Toe Raises: Both;10 reps;Seated Heel Raises: Both;10 reps;Seated    General Comments        Pertinent Vitals/Pain Pain Assessment: No/denies pain    Home Living                      Prior Function            PT Goals (current goals can now be found in the care plan section) Acute Rehab PT Goals Patient Stated Goal: Ready to feel better PT Goal Formulation: With patient Time For Goal Achievement: 06/09/21 Potential to Achieve Goals: Fair Progress towards PT goals: Progressing  toward goals    Frequency    Min 2X/week      PT Plan Current plan remains appropriate    Co-evaluation              AM-PAC PT "6 Clicks" Mobility   Outcome Measure  Help needed turning from your back to your side while in a flat bed without using bedrails?: A Little Help needed moving from lying on your back to sitting on the side of a flat bed without using bedrails?: A Little Help needed moving to and from a bed  to a chair (including a wheelchair)?: A Little Help needed standing up from a chair using your arms (e.g., wheelchair or bedside chair)?: A Little Help needed to walk in hospital room?: A Little Help needed climbing 3-5 steps with a railing? : A Lot 6 Click Score: 17    End of Session Equipment Utilized During Treatment: Gait belt Activity Tolerance: Patient limited by fatigue Patient left: in chair;with call bell/phone within reach;with chair alarm set Nurse Communication: Mobility status PT Visit Diagnosis: Unsteadiness on feet (R26.81);Muscle weakness (generalized) (M62.81);History of falling (Z91.81);Difficulty in walking, not elsewhere classified (R26.2)     Time: 8309-4076 PT Time Calculation (min) (ACUTE ONLY): 24 min  Charges:  $Therapeutic Exercise: 8-22 mins $Therapeutic Activity: 8-22 mins                     Gabby Rackers A. Gilford Rile PT, DPT Acute Rehabilitation Services Pager [phone] Office [phone]    Linna Hoff 05/28/2021, 4:13 PM

## 2021-05-28 NOTE — Progress Notes (Signed)
   Providing Compassionate, Quality Care - Together  NEUROSURGERY PROGRESS NOTE   S: No issues overnight  O: EXAM:  BP (!) 176/66 (BP Location: Right Arm)   Pulse 83   Temp (!) 97.4 F (36.3 C) (Oral)   Resp 18   Wt 73.6 kg   SpO2 97%   BMI 26.19 kg/m   Awake, alert, oriented x3 PERRLA EOMI Speech fluent, appropriate CNs grossly intact 5/5 BUE/BLE  No drift   ASSESSMENT:  75 y.o. female with   Right frontal anterior skull base mass   -Possible meningioma versus metastasis   PLAN: -Continue to hold Plavix -Scheduled for right craniotomy resection of tumor on Friday 7/8.  Unfortunately there is not earlier operative time once she has been off of her Plavix for 7 days. -Continue PT/OT -DVT and seizure prophylaxis -Decadron 4 q 6 -I have discussed with the patient and both daughters at bedside, they agreed with surgical intervention at this time     Thank you for allowing me to participate in this patient's care.  Please do not hesitate to call with questions or concerns.   Elwin Sleight, [facility] Cell: [phone]

## 2021-05-28 NOTE — Progress Notes (Signed)
Occupational Therapy Treatment Patient Details Name: Barbara Gibson MRN: [id number] DOB: July 06, 1946 Today's Date: 05/28/2021    History of present illness 75 y/o female presented to [facility] on 6/28 with generalized weakness x 10 days, difficulty with executive functioning, and headache. T/f to [facility]. 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 PMH: COPD, anxiety, depression, HLD, MI in 2015   OT comments  Patient with incremental gains to patient focused OT goals.  Patient is very lethargic, stating she did not sleep well, admits to hallucinations overnight.  Patient found sleeping in recliner with lunch tray in front of her.  Patient assisted back to bed, positioned, and encouraged to eat.  Her surgery appears to be next week, so OT will continue to follow her post surgery to refine any discharge recommendations.  SNF is recommended at his time.    Follow Up Recommendations  SNF    Equipment Recommendations  Tub/shower seat    Recommendations for Other Services      Precautions / Restrictions Precautions Precautions: Fall Precaution Comments: 4L O2 @ baseline Restrictions Weight Bearing Restrictions: No       Mobility Bed Mobility Overal bed mobility: Needs Assistance Bed Mobility: Sit to Supine     Supine to sit: Min assist       Patient Response: Flat affect  Transfers Overall transfer level: Needs assistance Equipment used: Rolling walker (2 wheeled);1 person hand held assist Transfers: Sit to/from [facility] Sit to Stand: Mod assist Stand pivot transfers: Min assist            Balance Overall balance assessment: Needs assistance Sitting-balance support: Feet supported Sitting balance-Leahy Scale: Fair     Standing balance support: Bilateral upper extremity supported Standing balance-Leahy Scale: Poor Standing balance comment: needing external assist                                 Pertinent Vitals/ Pain       Pain Assessment: No/denies pain                                                          Frequency  Min 2X/week        Progress Toward Goals  OT Goals(current goals can now be found in the care plan section)  Progress towards OT goals: Progressing toward goals  Acute Rehab OT Goals Patient Stated Goal: Ready to feel better OT Goal Formulation: With patient Time For Goal Achievement: 06/09/21 Potential to Achieve Goals: Good  Plan Discharge plan remains appropriate    Co-evaluation                 AM-PAC OT "6 Clicks" Daily Activity     Outcome Measure   Help from another person eating meals?: None Help from another person taking care of personal grooming?: A Little Help from another person toileting, which includes using toliet, bedpan, or urinal?: A Lot Help from another person bathing (including washing, rinsing, drying)?: A Lot Help from another person to put on and taking off regular upper body clothing?: A Little Help from another person to put on and taking off regular lower body clothing?: A Lot 6  Click Score: 16    End of Session Equipment Utilized During Treatment: Rolling walker  OT Visit Diagnosis: Unsteadiness on feet (R26.81);Muscle weakness (generalized) (M62.81);Other symptoms and signs involving cognitive function   Activity Tolerance Patient limited by lethargy   Patient Left in bed;with call bell/phone within reach;with bed alarm set   Nurse Communication          Time: [phone] OT Time Calculation (min): 13 min  Charges: OT General Charges $OT Visit: 1 Visit OT Treatments $Self Care/Home Management : 8-22 mins  05/28/2021  Rich, OTR/L  Acute Rehabilitation Services  Office:  Geraldine 05/28/2021, 1:21 PM

## 2021-05-29 DIAGNOSIS — N390 Urinary tract infection, site not specified: Secondary | ICD-10-CM | POA: Diagnosis not present

## 2021-05-29 LAB — BASIC METABOLIC PANEL
Anion gap: 7 (ref 5–15)
BUN: 23 mg/dL (ref 8–23)
CO2: 43 mmol/L — ABNORMAL HIGH (ref 22–32)
Calcium: 9.7 mg/dL (ref 8.9–10.3)
Chloride: 89 mmol/L — ABNORMAL LOW (ref 98–111)
Creatinine, Ser: 0.71 mg/dL (ref 0.44–1.00)
GFR, Estimated: >60 mL/min (ref >60)
Glucose, Bld: 128 mg/dL — ABNORMAL HIGH (ref 70–99)
Potassium: 4.4 mmol/L (ref 3.5–5.1)
Sodium: 139 mmol/L (ref 135–145)

## 2021-05-29 LAB — CBC
HCT: 38.7 % (ref 36.0–46.0)
Hemoglobin: 12.2 g/dL (ref 12.0–15.0)
MCH: 29.1 pg (ref 26.0–34.0)
MCHC: 31.5 g/dL (ref 30.0–36.0)
MCV: 92.4 fL (ref 80.0–100.0)
Platelets: 251 10*3/uL (ref 150–400)
RBC: 4.19 MIL/uL (ref 3.87–5.11)
RDW: 12.5 % (ref 11.5–15.5)
WBC: 6 10*3/uL (ref 4.0–10.5)
nRBC: 0 % (ref 0.0–0.2)

## 2021-05-29 LAB — GLUCOSE, CAPILLARY: Glucose-Capillary: 121 mg/dL — ABNORMAL HIGH (ref 70–99)

## 2021-05-29 MED ORDER — AMLODIPINE BESYLATE 5 MG PO TABS
5.0000 mg | ORAL_TABLET | Freq: Every day | ORAL | Status: DC
  Administered 2021-05-29 – 2021-06-09 (×11): 5 mg via ORAL
  Filled 2021-05-29 (×11): qty 1

## 2021-05-29 NOTE — Progress Notes (Signed)
PROGRESS NOTE    Barbara Gibson  TKZ:[id number] DOB: Apr 10, 1946 DOA: 05/25/2021 PCP: [name], MD    Chief Complaint  Patient presents with   Weakness    Brief Narrative:   Barbara Gibson is a 75 y.o. female with medical history significant of anxiety, COPD, depression, hyperlipidemia, CAD, history of MI, former smoker, history of recent UTI.  History of breast cancer status post lumpectomy, few years ago no radiation or chemo given her poor general health presenting with generalized weaknesses x3 days.  Her work-up significant for brain mass, likely meningioma per neurosurgery.  Assessment & Plan:   Principal Problem:   Brain mass Active Problems:   Panlobular emphysema (HCC)   Coronary artery disease involving native coronary artery of native heart without angina pectoris   AKI (acute kidney injury) ([place])   Weakness   Acute lower UTI   Dehydration   Encephalopathy   New brain mass with edema/encephalopathy -Possible meningioma versus metastasis MRI of the brain revealed 2.7 cm anterior inferior right frontal lobe mass with extensive surrounding edema.  -Neurosurgery input greatly appreciated, findings concerning for meningioma, as well primary malignancy versus metastasis cannot be ruled out, so recommendation is for [facility] intervention, for resection, where as well will be able to obtain biopsy to rule out malignancy, meanwhile continue with IV Decadron given evidence of edema and midline shift. -Neurology input greatly appreciated, EEG unremarkable for seizures, but has rhythmic delta slowing in the right frontal region, for now continue with Keppra 250 mg p.o. twice daily, if she has any breakthrough seizures activity, then it can be increased to 500 mg p.o. twice daily  History of breast cancer -Status post lumpectomy, no radiation, CT chest/abdomen/pelvis significant for left upper lobe 1.7 x 1 cm subpleural nodule, and smaller pulmonary nodules, have consulted  oncology regarding further recommendations.   AKI (acute kidney injury) ([place]) -Solved with IV fluids, she is with some trace edema today, I will stop her fluids and give low dose of lasix.   Chronic hypoxic respiratory failure - At baseline, on 4 L nasal cannula   lower UTI  -treated with Rocephin   Altered, confused due to acute metabolic encephalopathy  - in the setting of brain mass and UTI -Improving with IV steroids and antibiotics, daughter at bedside reports she is not back to her baseline  COPD/emphysema ([place]) - -As needed DuoNeb, bronchodilators, supplemental oxygen as needed -Patient complains of worsening dyspnea, her oxygen requirement remains at baseline, no wheezing, x-ray with no acute findings, she is having some anxiety as well, she is already on steroids for her brain tumor..   Coronary artery disease involving native coronary artery of native heart without angina pectoris -Resume home medication including metoprolol, statins, will hold Plavix in case she needs neurosurgery   Hyperlipidemia -continue home medication of simvastatin,   Weakness -PT OT, fall precaution  Goals of care -I have discussed with daughter at bedside, I have informed her patient with very poor baseline in the setting of her known COPD, 4 L at baseline, he has not been ambulating much beside her home, and with the plan for brain surgery, further work-up for her lung tumor which very likely malignant, there is anticipated prolonged recovery with possible a lot of setbacks, she understands this, but report she and the family decided to take the chance, and they do understand the risks involved.  DVT prophylaxis: [place] heaprin Code Status: Full Family Communication: None at bedside Disposition:   Status is: inpatient  The patient will require care spanning > 2 midnights and should be moved to inpatient because: IV treatments appropriate due to intensity of illness or inability to take PO  Dispo:  The patient is from: Home              Anticipated d/c is to: SNF              Patient currently is not medically stable to d/c.  She remains on IV Solu-Medrol, extremely weak, deconditioned, will need SNF placement, and she will need surgical resection of brain mass as well.   Difficult to place patient No       Consultants:  Neurosurgery Oncology   Subjective:  She denies any chest pain, still complains of dyspnea  Objective: Vitals:   05/28/21 2348 05/29/21 0410 05/29/21 0850 05/29/21 1223  BP: (!) 159/72 (!) 172/54 (!) 145/67 (!) 127/52  Pulse: 83 77 (!) 106 70  Resp: (!) 24 (!) 22 20 20   Temp: (!) 97.5 F (36.4 C) 98.3 F (36.8 C) 97.7 F (36.5 C)   TempSrc: Oral Oral Oral   SpO2: 99% 99% 98% 99%  Weight:        Intake/Output Summary (Last 24 hours) at 05/29/2021 1228 Last data filed at 05/29/2021 8527 Gross per 24 hour  Intake 240 ml  Output 500 ml  Net -260 ml   Filed Weights   05/27/21 0332 05/28/21 0500  Weight: 74.9 kg 73.6 kg    Examination:  Awake Alert, Oriented X 3, frail, deconditioned Symmetrical Chest wall movement, no wheezing today RRR,No Gallops,Rubs or new Murmurs, No Parasternal Heave +ve B.Sounds, Abd Soft, No tenderness, No rebound - guarding or rigidity. No Cyanosis, Clubbing or edema, No new Rash or bruise         Data Reviewed: I have personally reviewed following labs and imaging studies  CBC: Recent Labs  Lab 05/25/21 0603 05/26/21 1113 05/27/21 0602 05/28/21 0330 05/29/21 0347  WBC 8.8 5.7 6.0 8.3 6.0  NEUTROABS 7.4  --   --   --   --   HGB 13.7 11.9* 10.6* 11.7* 12.2  HCT 44.2 37.7 34.1* 37.0 38.7  MCV 95.5 91.1 91.2 91.1 92.4  PLT 282 258 241 224 782    Basic Metabolic Panel: Recent Labs  Lab 05/25/21 0603 05/26/21 1113 05/27/21 0602 05/28/21 0330 05/29/21 0347  NA 135 136 140 139 139  K 4.4 4.2 4.6 4.3 4.4  CL 95* 95* 100 95* 89*  CO2 35* 33* 34* 37* 43*  GLUCOSE 122* 206* 137* 136* 128*  BUN 39* 30*  25* 21 23  CREATININE 1.76* 1.18* 0.79 0.73 0.71  CALCIUM 9.2 9.6 9.0 9.5 9.7    GFR: CrCl cannot be calculated (Unknown ideal weight.).  Liver Function Tests: Recent Labs  Lab 05/25/21 0603  AST 34  ALT 14  ALKPHOS 56  BILITOT 0.7  PROT 7.3  ALBUMIN 4.2    CBG: Recent Labs  Lab 05/25/21 0815 05/26/21 0755 05/27/21 0627 05/28/21 0620 05/29/21 0612  GLUCAP 101* 120* 141* 117* 121*     Recent Results (from the past 240 hour(s))  Resp Panel by RT-PCR (Flu A&B, Covid) Nasopharyngeal Swab     Status: None   Collection Time: 05/25/21  7:13 AM   Specimen: Nasopharyngeal Swab; Nasopharyngeal(NP) swabs in vial transport medium  Result Value Ref Range Status   SARS Coronavirus 2 by RT PCR NEGATIVE NEGATIVE Final    Comment: (NOTE) SARS-CoV-2 target nucleic acids are NOT  DETECTED.  The SARS-CoV-2 RNA is generally detectable in upper respiratory specimens during the acute phase of infection. The lowest concentration of SARS-CoV-2 viral copies this assay can detect is 138 copies/mL. A negative result does not preclude SARS-Cov-2 infection and should not be used as the sole basis for treatment or other patient management decisions. A negative result may occur with  improper specimen collection/handling, submission of specimen other than nasopharyngeal swab, presence of viral mutation(s) within the areas targeted by this assay, and inadequate number of viral copies(<138 copies/mL). A negative result must be combined with clinical observations, patient history, and epidemiological information. The expected result is Negative.  Fact Sheet for Patients:  EntrepreneurPulse.com.au  Fact Sheet for Healthcare Providers:  IncredibleEmployment.be  This test is no t yet approved or cleared by the [place] FDA and  has been authorized for detection and/or diagnosis of SARS-CoV-2 by FDA under an Emergency Use Authorization (EUA). This EUA  will remain  in effect (meaning this test can be used) for the duration of the COVID-19 declaration under Section 564(b)(1) of the Act, 21 U.S.C.section 360bbb-3(b)(1), unless the authorization is terminated  or revoked sooner.       Influenza A by PCR NEGATIVE NEGATIVE Final   Influenza B by PCR NEGATIVE NEGATIVE Final    Comment: (NOTE) The Xpert Xpress SARS-CoV-2/FLU/RSV plus assay is intended as an aid in the diagnosis of influenza from Nasopharyngeal swab specimens and should not be used as a sole basis for treatment. Nasal washings and aspirates are unacceptable for Xpert Xpress SARS-CoV-2/FLU/RSV testing.  Fact Sheet for Patients: EntrepreneurPulse.com.au  Fact Sheet for Healthcare Providers: IncredibleEmployment.be  This test is not yet approved or cleared by the [place] FDA and has been authorized for detection and/or diagnosis of SARS-CoV-2 by FDA under an Emergency Use Authorization (EUA). This EUA will remain in effect (meaning this test can be used) for the duration of the COVID-19 declaration under Section 564(b)(1) of the Act, 21 U.S.C. section 360bbb-3(b)(1), unless the authorization is terminated or revoked.  Performed at [facility], [address] [place], [place] [address]          Radiology Studies: [facility] Chest [facility] 1 View  Result Date: 05/28/2021 CLINICAL DATA:  Shortness of breath. EXAM: PORTABLE CHEST 1 VIEW COMPARISON:  Chest x-ray dated May 25, 2021. FINDINGS: Normal heart size. Unchanged upper mediastinal contour abnormality related to known multinodular thyroid goiter. Chronically coarsened interstitial markings with emphysematous changes. No focal consolidation, pleural effusion, or pneumothorax. No acute osseous abnormality. Old right-sided rib fractures again noted. IMPRESSION: 1. No active disease. 2. COPD. Electronically Signed   By: Titus Dubin M.D.   On: 05/28/2021 12:47        Scheduled  Meds:  amLODipine  5 mg Oral Daily   citalopram  20 mg Oral Daily   dexamethasone (DECADRON) injection  4 mg Intravenous Q6H   heparin  5,000 Units Subcutaneous Q8H   levETIRAcetam  250 mg Oral BID   metoprolol tartrate  12.5 mg Oral BID   multivitamin with minerals  1 tablet Oral Daily   nystatin  1 application Topical BID   pantoprazole  40 mg Oral Daily   simvastatin  20 mg Oral q1800   sodium chloride flush  3 mL Intravenous Q12H   sodium chloride flush  3 mL Intravenous Q12H   Continuous Infusions:  sodium chloride       LOS: 3 days      Phillips Climes, MD [facility]  To contact the attending provider between 7A-7P or the covering provider during after hours 7P-7A, please log into the web site [email or web address] and access using universal [place] password for that web site. If you do not have the password, please call the hospital operator.  05/29/2021, 12:28 PM

## 2021-05-29 NOTE — Plan of Care (Signed)

## 2021-05-29 NOTE — Progress Notes (Signed)
  NEUROSURGERY PROGRESS NOTE   No issues overnight.   EXAM:  BP (!) 145/67 (BP Location: Left Arm)   Pulse (!) 106   Temp 97.7 F (36.5 C) (Oral)   Resp 20   Wt 73.6 kg   SpO2 98%   BMI 26.19 kg/m   Awake, alert Speech fluent, appropriate  CN grossly intact  5/5 BUE/BLE   IMPRESSION:  75 y.o. female presenting with generalized weakness, headache found to have right frontal likely dural-based mass. Plan for surgical resection Friday with Dr. [name]  PLAN: - Cont current plan of care - Plan for surgical resection of mass on Friday   Consuella Lose, MD [facility]

## 2021-05-29 NOTE — Plan of Care (Signed)
  Problem: Education: Goal: Knowledge of General Education information will improve Description: Including pain rating scale, medication(s)/side effects and non-pharmacologic comfort measures 05/29/2021 1924 by Florestine Avers, RN Outcome: Progressing 05/29/2021 0530 by Florestine Avers, RN Outcome: Progressing   Problem: Health Behavior/Discharge Planning: Goal: Ability to manage health-related needs will improve 05/29/2021 1924 by Florestine Avers, RN Outcome: Progressing 05/29/2021 0530 by Florestine Avers, RN Outcome: Progressing   Problem: Clinical Measurements: Goal: Ability to maintain clinical measurements within normal limits will improve 05/29/2021 1924 by Florestine Avers, RN Outcome: Progressing 05/29/2021 0530 by Florestine Avers, RN Outcome: Progressing Goal: Will remain free from infection 05/29/2021 1924 by Florestine Avers, RN Outcome: Progressing 05/29/2021 0530 by Florestine Avers, RN Outcome: Progressing Goal: Diagnostic test results will improve 05/29/2021 1924 by Florestine Avers, RN Outcome: Progressing 05/29/2021 0530 by Florestine Avers, RN Outcome: Progressing Goal: Respiratory complications will improve 05/29/2021 1924 by Florestine Avers, RN Outcome: Progressing 05/29/2021 0530 by Florestine Avers, RN Outcome: Progressing Goal: Cardiovascular complication will be avoided 05/29/2021 1924 by Florestine Avers, RN Outcome: Progressing 05/29/2021 0530 by Florestine Avers, RN Outcome: Progressing   Problem: Activity: Goal: Risk for activity intolerance will decrease 05/29/2021 1924 by Florestine Avers, RN Outcome: Progressing 05/29/2021 0530 by Florestine Avers, RN Outcome: Progressing   Problem: Nutrition: Goal: Adequate nutrition will be maintained 05/29/2021 1924 by Florestine Avers, RN Outcome: Progressing 05/29/2021 0530 by Florestine Avers, RN Outcome: Progressing   Problem: Coping: Goal: Level of anxiety will  decrease 05/29/2021 1924 by Florestine Avers, RN Outcome: Progressing 05/29/2021 0530 by Florestine Avers, RN Outcome: Progressing   Problem: Elimination: Goal: Will not experience complications related to bowel motility 05/29/2021 1924 by Florestine Avers, RN Outcome: Progressing 05/29/2021 0530 by Florestine Avers, RN Outcome: Progressing Goal: Will not experience complications related to urinary retention 05/29/2021 1924 by Florestine Avers, RN Outcome: Progressing 05/29/2021 0530 by Florestine Avers, RN Outcome: Progressing   Problem: Pain Managment: Goal: General experience of comfort will improve 05/29/2021 1924 by Florestine Avers, RN Outcome: Progressing 05/29/2021 0530 by Florestine Avers, RN Outcome: Progressing   Problem: Safety: Goal: Ability to remain free from injury will improve 05/29/2021 1924 by Florestine Avers, RN Outcome: Progressing 05/29/2021 0530 by Florestine Avers, RN Outcome: Progressing   Problem: Skin Integrity: Goal: Risk for impaired skin integrity will decrease 05/29/2021 1924 by Florestine Avers, RN Outcome: Progressing 05/29/2021 0530 by Florestine Avers, RN Outcome: Progressing

## 2021-05-30 DIAGNOSIS — N390 Urinary tract infection, site not specified: Secondary | ICD-10-CM | POA: Diagnosis not present

## 2021-05-30 LAB — CBC
HCT: 38.4 % (ref 36.0–46.0)
Hemoglobin: 11.7 g/dL — ABNORMAL LOW (ref 12.0–15.0)
MCH: 28.7 pg (ref 26.0–34.0)
MCHC: 30.5 g/dL (ref 30.0–36.0)
MCV: 94.1 fL (ref 80.0–100.0)
Platelets: 230 10*3/uL (ref 150–400)
RBC: 4.08 MIL/uL (ref 3.87–5.11)
RDW: 12.3 % (ref 11.5–15.5)
WBC: 6.9 10*3/uL (ref 4.0–10.5)
nRBC: 0 % (ref 0.0–0.2)

## 2021-05-30 LAB — BASIC METABOLIC PANEL
Anion gap: 4 — ABNORMAL LOW (ref 5–15)
BUN: 18 mg/dL (ref 8–23)
CO2: 45 mmol/L — ABNORMAL HIGH (ref 22–32)
Calcium: 9.5 mg/dL (ref 8.9–10.3)
Chloride: 90 mmol/L — ABNORMAL LOW (ref 98–111)
Creatinine, Ser: 0.68 mg/dL (ref 0.44–1.00)
GFR, Estimated: >60 mL/min (ref >60)
Glucose, Bld: 149 mg/dL — ABNORMAL HIGH (ref 70–99)
Potassium: 4.8 mmol/L (ref 3.5–5.1)
Sodium: 139 mmol/L (ref 135–145)

## 2021-05-30 LAB — GLUCOSE, CAPILLARY: Glucose-Capillary: 121 mg/dL — ABNORMAL HIGH (ref 70–99)

## 2021-05-30 MED ORDER — UMECLIDINIUM-VILANTEROL 62.5-25 MCG/INH IN AEPB
1.0000 | INHALATION_SPRAY | Freq: Every day | RESPIRATORY_TRACT | Status: DC
  Administered 2021-05-30 – 2021-06-09 (×8): 1 via RESPIRATORY_TRACT
  Filled 2021-05-30 (×3): qty 14

## 2021-05-30 MED ORDER — ENSURE ENLIVE PO LIQD
237.0000 mL | Freq: Three times a day (TID) | ORAL | Status: DC
  Administered 2021-05-30 – 2021-06-09 (×21): 237 mL via ORAL
  Filled 2021-05-30 (×5): qty 237

## 2021-05-30 MED ORDER — ALPRAZOLAM 0.5 MG PO TABS
1.0000 mg | ORAL_TABLET | Freq: Two times a day (BID) | ORAL | Status: DC | PRN
  Administered 2021-05-30 – 2021-06-09 (×9): 1 mg via ORAL
  Filled 2021-05-30 (×10): qty 2

## 2021-05-30 NOTE — Progress Notes (Signed)
PROGRESS NOTE    Barbara Gibson  QIH:[id number] DOB: [birth year] DOA: 05/25/2021 PCP: [name], MD    Chief Complaint  Patient presents with   Weakness    Brief Narrative:   Barbara Gibson is a 75 y.o. female with medical history significant of anxiety, COPD, depression, hyperlipidemia, CAD, history of MI, former smoker, history of recent UTI.  History of breast cancer status post lumpectomy, few years ago no radiation or chemo given her poor general health presenting with generalized weaknesses x3 days.  Her work-up significant for brain mass, likely meningioma per neurosurgery.  Assessment & Plan:   Principal Problem:   Brain mass Active Problems:   Panlobular emphysema (HCC)   Coronary artery disease involving native coronary artery of native heart without angina pectoris   AKI (acute kidney injury) ([place])   Weakness   Acute lower UTI   Dehydration   Encephalopathy   New brain mass with edema/encephalopathy -Possible meningioma versus metastasis MRI of the brain revealed 2.7 cm anterior inferior right frontal lobe mass with extensive surrounding edema.  -Neurosurgery input greatly appreciated, findings concerning for meningioma, as well primary malignancy versus metastasis cannot be ruled out, so recommendation is for [facility] intervention, for resection, where as well will be able to obtain biopsy to rule out malignancy, meanwhile continue with IV Decadron given evidence of edema and midline shift. -Neurology input greatly appreciated, EEG unremarkable for seizures, but has rhythmic delta slowing in the right frontal region, for now continue with Keppra 250 mg p.o. twice daily, if she has any breakthrough seizures activity, then it can be increased to 500 mg p.o. twice daily -Neurosurgery input greatly appreciated, plan for surgery this coming Friday given she was on Plavix and it need to be held 7 days before surgery.  History of breast cancer -Status post lumpectomy, no  radiation, CT chest/abdomen/pelvis significant for left upper lobe 1.7 x 1 cm subpleural nodule, and smaller pulmonary nodules, have consulted oncology regarding further recommendations.   AKI (acute kidney injury) ([place]) -Solved with IV fluids, she is with some trace edema today, I will stop her fluids and give low dose of lasix.   Chronic hypoxic respiratory failure - At baseline, on 4 L nasal cannula   lower UTI  -treated with Rocephin   Altered, confused due to acute metabolic encephalopathy  - in the setting of brain mass and UTI -Improving with IV steroids and antibiotics, back at baseline.  COPD/emphysema ([place]) - -As needed DuoNeb, bronchodilators, supplemental oxygen as needed -Patient complains of worsening dyspnea, her oxygen requirement remains at baseline, no wheezing, x-ray with no acute findings, she is having some anxiety as well, she is already on steroids for her brain tumor..   Coronary artery disease involving native coronary artery of native heart without angina pectoris -Resume home medication including metoprolol, statins, will hold Plavix in case she needs neurosurgery   Hyperlipidemia -continue home medication of simvastatin,   Weakness -PT OT, fall precaution  Goals of care -I have discussed with daughter at bedside, I have informed her patient with very poor baseline in the setting of her known COPD, 4 L at baseline, he has not been ambulating much beside her home, and with the plan for brain surgery, further work-up for her lung tumor which very likely malignant, there is anticipated prolonged recovery with possible a lot of setbacks, she understands this, but report she and the family decided to take the chance, and they do understand the risks involved.  DVT  prophylaxis: [place] heaprin Code Status: Full Family Communication: sister  at bedside Disposition:   Status is: inpatient  The patient will require care spanning > 2 midnights and should be moved to  inpatient because: IV treatments appropriate due to intensity of illness or inability to take PO  Dispo: The patient is from: Home              Anticipated d/c is to: SNF              Patient currently is not medically stable to d/c.  She remains on IV Solu-Medrol, extremely weak, deconditioned, will need SNF placement, and she will need surgical resection of brain mass as well.   Difficult to place patient No       Consultants:  Neurosurgery Oncology   Subjective:  She denies any chest pain, still complains of dyspnea  Objective: Vitals:   05/30/21 0000 05/30/21 0417 05/30/21 0825 05/30/21 0956  BP:  (!) 150/68 (!) 163/61   Pulse:  81 91 91  Resp:  18 20 20   Temp:  98.3 F (36.8 C) (!) 97.5 F (36.4 C)   TempSrc:   Oral   SpO2:  (!) 81% 98% 98%  Weight:      Height: 5\' 6"  (1.676 m)       Intake/Output Summary (Last 24 hours) at 05/30/2021 1224 Last data filed at 05/30/2021 1610 Gross per 24 hour  Intake --  Output 3200 ml  Net -3200 ml   Filed Weights   05/27/21 0332 05/28/21 0500  Weight: 74.9 kg 73.6 kg    Examination:  Awake Alert, Oriented X 3, frail, deconditioned  symmetrical Chest wall movement, Good air movement bilaterally, CTAB RRR,No Gallops,Rubs or new Murmurs, No Parasternal Heave +ve B.Sounds, Abd Soft, No tenderness, No rebound - guarding or rigidity. No Cyanosis, Clubbing or edema, No new Rash or bruise         Data Reviewed: I have personally reviewed following labs and imaging studies  CBC: Recent Labs  Lab 05/25/21 0603 05/26/21 1113 05/27/21 0602 05/28/21 0330 05/29/21 0347 05/30/21 0408  WBC 8.8 5.7 6.0 8.3 6.0 6.9  NEUTROABS 7.4  --   --   --   --   --   HGB 13.7 11.9* 10.6* 11.7* 12.2 11.7*  HCT 44.2 37.7 34.1* 37.0 38.7 38.4  MCV 95.5 91.1 91.2 91.1 92.4 94.1  PLT 282 258 241 224 251 960    Basic Metabolic Panel: Recent Labs  Lab 05/26/21 1113 05/27/21 0602 05/28/21 0330 05/29/21 0347 05/30/21 0408  NA 136 140  139 139 139  K 4.2 4.6 4.3 4.4 4.8  CL 95* 100 95* 89* 90*  CO2 33* 34* 37* 43* 45*  GLUCOSE 206* 137* 136* 128* 149*  BUN 30* 25* 21 23 18   CREATININE 1.18* 0.79 0.73 0.71 0.68  CALCIUM 9.6 9.0 9.5 9.7 9.5    GFR: Estimated Creatinine Clearance: 61.4 mL/min (by C-G formula based on SCr of 0.68 mg/dL).  Liver Function Tests: Recent Labs  Lab 05/25/21 0603  AST 34  ALT 14  ALKPHOS 56  BILITOT 0.7  PROT 7.3  ALBUMIN 4.2    CBG: Recent Labs  Lab 05/26/21 0755 05/27/21 0627 05/28/21 0620 05/29/21 0612 05/30/21 0613  GLUCAP 120* 141* 117* 121* 121*     Recent Results (from the past 240 hour(s))  Resp Panel by RT-PCR (Flu A&B, Covid) Nasopharyngeal Swab     Status: None   Collection Time: 05/25/21  7:13  AM   Specimen: Nasopharyngeal Swab; Nasopharyngeal(NP) swabs in vial transport medium  Result Value Ref Range Status   SARS Coronavirus 2 by RT PCR NEGATIVE NEGATIVE Final    Comment: (NOTE) SARS-CoV-2 target nucleic acids are NOT DETECTED.  The SARS-CoV-2 RNA is generally detectable in upper respiratory specimens during the acute phase of infection. The lowest concentration of SARS-CoV-2 viral copies this assay can detect is 138 copies/mL. A negative result does not preclude SARS-Cov-2 infection and should not be used as the sole basis for treatment or other patient management decisions. A negative result may occur with  improper specimen collection/handling, submission of specimen other than nasopharyngeal swab, presence of viral mutation(s) within the areas targeted by this assay, and inadequate number of viral copies(<138 copies/mL). A negative result must be combined with clinical observations, patient history, and epidemiological information. The expected result is Negative.  Fact Sheet for Patients:  EntrepreneurPulse.com.au  Fact Sheet for Healthcare Providers:  IncredibleEmployment.be  This test is no t yet approved  or cleared by the [place] FDA and  has been authorized for detection and/or diagnosis of SARS-CoV-2 by FDA under an Emergency Use Authorization (EUA). This EUA will remain  in effect (meaning this test can be used) for the duration of the COVID-19 declaration under Section 564(b)(1) of the Act, 21 U.S.C.section 360bbb-3(b)(1), unless the authorization is terminated  or revoked sooner.       Influenza A by PCR NEGATIVE NEGATIVE Final   Influenza B by PCR NEGATIVE NEGATIVE Final    Comment: (NOTE) The Xpert Xpress SARS-CoV-2/FLU/RSV plus assay is intended as an aid in the diagnosis of influenza from Nasopharyngeal swab specimens and should not be used as a sole basis for treatment. Nasal washings and aspirates are unacceptable for Xpert Xpress SARS-CoV-2/FLU/RSV testing.  Fact Sheet for Patients: EntrepreneurPulse.com.au  Fact Sheet for Healthcare Providers: IncredibleEmployment.be  This test is not yet approved or cleared by the [place] FDA and has been authorized for detection and/or diagnosis of SARS-CoV-2 by FDA under an Emergency Use Authorization (EUA). This EUA will remain in effect (meaning this test can be used) for the duration of the COVID-19 declaration under Section 564(b)(1) of the Act, 21 U.S.C. section 360bbb-3(b)(1), unless the authorization is terminated or revoked.  Performed at [facility], [address] [place], [place] [address]          Radiology Studies: No results found.      Scheduled Meds:  amLODipine  5 mg Oral Daily   citalopram  20 mg Oral Daily   dexamethasone (DECADRON) injection  4 mg Intravenous Q6H   feeding supplement  237 mL Oral TID BM   heparin  5,000 Units Subcutaneous Q8H   levETIRAcetam  250 mg Oral BID   metoprolol tartrate  12.5 mg Oral BID   multivitamin with minerals  1 tablet Oral Daily   nystatin  1 application Topical BID   pantoprazole  40 mg Oral Daily    simvastatin  20 mg Oral q1800   sodium chloride flush  3 mL Intravenous Q12H   umeclidinium-vilanterol  1 puff Inhalation Daily   Continuous Infusions:     LOS: 4 days      Phillips Climes, MD [facility]   To contact the attending provider between 7A-7P or the covering provider during after hours 7P-7A, please log into the web site [email or web address] and access using universal [place] password for that web site. If you do not have the password, please call the  hospital operator.  05/30/2021, 12:24 PM

## 2021-05-30 NOTE — Progress Notes (Signed)
Subjective: Patient reports poor appetite.  Objective: Vital signs in last 24 hours: Temp:  [97.5 F (36.4 C)-98.3 F (36.8 C)] 97.5 F (36.4 C) (07/03 0825) Pulse Rate:  [70-91] 91 (07/03 0956) Resp:  [18-20] 20 (07/03 0956) BP: (117-163)/(46-68) 163/61 (07/03 0825) SpO2:  [81 %-100 %] 98 % (07/03 0956)  Intake/Output from previous day: 07/02 0701 - 07/03 0700 In: -  Out: 3200 [Urine:3200] Intake/Output this shift: No intake/output data recorded.  NAD Awake, alert FC x 4, no drift  Lab Results: Recent Labs    05/29/21 0347 05/30/21 0408  WBC 6.0 6.9  HGB 12.2 11.7*  HCT 38.7 38.4  PLT 251 230   BMET Recent Labs    05/29/21 0347 05/30/21 0408  NA 139 139  K 4.4 4.8  CL 89* 90*  CO2 43* 45*  GLUCOSE 128* 149*  BUN 23 18  CREATININE 0.71 0.68  CALCIUM 9.7 9.5    Studies/Results: DG Chest Port 1 View  Result Date: 05/28/2021 CLINICAL DATA:  Shortness of breath. EXAM: PORTABLE CHEST 1 VIEW COMPARISON:  Chest x-ray dated May 25, 2021. FINDINGS: Normal heart size. Unchanged upper mediastinal contour abnormality related to known multinodular thyroid goiter. Chronically coarsened interstitial markings with emphysematous changes. No focal consolidation, pleural effusion, or pneumothorax. No acute osseous abnormality. Old right-sided rib fractures again noted. IMPRESSION: 1. No active disease. 2. COPD. Electronically Signed   By: Titus Dubin M.D.   On: 05/28/2021 12:47    Assessment/Plan: R frontal lobe lesion which is convincingly intraparenchymal on my review, likely representing metastatic disease, with primary malignant brain tumor less likely based on imaging. - resection by Dr. [name] planned for later this week   Vallarie Mare 05/30/2021, 10:32 AM

## 2021-05-31 DIAGNOSIS — E440 Moderate protein-calorie malnutrition: Secondary | ICD-10-CM | POA: Diagnosis not present

## 2021-05-31 DIAGNOSIS — E43 Unspecified severe protein-calorie malnutrition: Secondary | ICD-10-CM | POA: Insufficient documentation

## 2021-05-31 NOTE — Progress Notes (Signed)
PROGRESS NOTE    Barbara Gibson  YKD:[id number] DOB: August 28, 1946 DOA: 05/25/2021 PCP: [name], MD    Chief Complaint  Patient presents with   Weakness    Brief Narrative:   Barbara Gibson is a 75 y.o. female with medical history significant of anxiety, COPD, depression, hyperlipidemia, CAD, history of MI, former smoker, history of recent UTI.  History of breast cancer status post lumpectomy, few years ago no radiation or chemo given her poor general health presenting with generalized weaknesses x3 days.  Her work-up significant for brain mass, likely meningioma per neurosurgery.  Assessment & Plan:   Principal Problem:   Brain mass Active Problems:   Panlobular emphysema (HCC)   Coronary artery disease involving native coronary artery of native heart without angina pectoris   AKI (acute kidney injury) (HCC)   Weakness   Acute lower UTI   Dehydration   Encephalopathy   Malnutrition of moderate degree   New brain mass with edema/encephalopathy -Possible meningioma versus metastasis MRI of the brain revealed 2.7 cm anterior inferior right frontal lobe mass with extensive surrounding edema.  -Neurosurgery input greatly appreciated, findings concerning for meningioma, as well primary malignancy versus metastasis cannot be ruled out, so recommendation is for [facility] intervention, for resection, where as well will be able to obtain biopsy to rule out malignancy, meanwhile continue with IV Decadron given evidence of edema and midline shift. -Neurology input greatly appreciated, EEG unremarkable for seizures, but has rhythmic delta slowing in the right frontal region, for now continue with Keppra 250 mg p.o. twice daily, if she has any breakthrough seizures activity, then it can be increased to 500 mg p.o. twice daily -Neurosurgery input greatly appreciated, plan for surgery this coming Friday given she was on Plavix and it need to be held 7 days before surgery.  History of breast  cancer -Status post lumpectomy, no radiation, CT chest/abdomen/pelvis significant for left upper lobe 1.7 x 1 cm subpleural nodule, and smaller pulmonary nodules, have consulted oncology regarding further recommendations.   AKI (acute kidney injury) ([place]) -This has resolved with IV fluids, she is currently off IV fluids.  Chronic hypoxic respiratory failure - At baseline, on 4 L nasal cannula   lower UTI  -treated with Rocephin   Altered, confused due to acute metabolic encephalopathy  - in the setting of brain mass and UTI -Improving with IV steroids and antibiotics, back at baseline.  COPD/emphysema ([place]) - -As needed DuoNeb, bronchodilators, supplemental oxygen as needed -Patient complains of worsening dyspnea, her oxygen requirement remains at baseline, no wheezing, x-ray with no acute findings, she is having some anxiety as well, she is already on steroids for her brain tumor..   Coronary artery disease involving native coronary artery of native heart without angina pectoris -Resume home medication including metoprolol, statins, will hold Plavix in case she needs neurosurgery   Hyperlipidemia -continue home medication of simvastatin,   Weakness -PT OT, fall precaution  Moderate protein calorie malnutrition -Continue with supplement, dietitian input greatly appreciated  Goals of care -I have discussed with daughter at bedside, I have informed her patient with very poor baseline in the setting of her known COPD, 4 L at baseline, he has not been ambulating much beside her home, and with the plan for brain surgery, further work-up for her lung tumor which very likely malignant, there is anticipated prolonged recovery with possible a lot of setbacks, she understands this, but report she and the family decided to take the chance, and they  do understand the risks involved.  DVT prophylaxis: [place] heaprin Code Status: Full Family Communication: sister  at bedside Disposition:   Status  is: inpatient  The patient will require care spanning > 2 midnights and should be moved to inpatient because: IV treatments appropriate due to intensity of illness or inability to take PO  Dispo: The patient is from: Home              Anticipated d/c is to: SNF              Patient currently is not medically stable to d/c.  She remains on IV Solu-Medrol, extremely weak, deconditioned, will need SNF placement, and she will need surgical resection of brain mass as well.   Difficult to place patient No       Consultants:  Neurosurgery Oncology   Subjective:  Per sister at bedside, patient had some insomnia yesterday, difficult time sleeping where she required trazodone and Xanax Objective: Vitals:   05/30/21 2347 05/31/21 0438 05/31/21 0724 05/31/21 1218  BP: (!) 134/53 (!) 138/54 (!) 131/58 134/62  Pulse: 88 73 71 79  Resp: 17 18 18 17   Temp: 98.3 F (36.8 C) (!) 97.5 F (36.4 C) 98 F (36.7 C) 97.9 F (36.6 C)  TempSrc: Oral Oral Oral Oral  SpO2: 95% 100% 100% 97%  Weight:      Height:        Intake/Output Summary (Last 24 hours) at 05/31/2021 1356 Last data filed at 05/31/2021 0433 Gross per 24 hour  Intake 340 ml  Output 1575 ml  Net -1235 ml   Filed Weights   05/27/21 0332 05/28/21 0500  Weight: 74.9 kg 73.6 kg    Examination:  Sleeping comfortably, no apparent distress Symmetrical Chest wall movement, Good air movement bilaterally, CTAB RRR,No Gallops,Rubs or new Murmurs, No Parasternal Heave +ve B.Sounds, Abd Soft, No tenderness, No rebound - guarding or rigidity. No Cyanosis, Clubbing or edema, No new Rash or bruise         Data Reviewed: I have personally reviewed following labs and imaging studies  CBC: Recent Labs  Lab 05/25/21 0603 05/26/21 1113 05/27/21 0602 05/28/21 0330 05/29/21 0347 05/30/21 0408  WBC 8.8 5.7 6.0 8.3 6.0 6.9  NEUTROABS 7.4  --   --   --   --   --   HGB 13.7 11.9* 10.6* 11.7* 12.2 11.7*  HCT 44.2 37.7 34.1* 37.0 38.7  38.4  MCV 95.5 91.1 91.2 91.1 92.4 94.1  PLT 282 258 241 224 251 195    Basic Metabolic Panel: Recent Labs  Lab 05/26/21 1113 05/27/21 0602 05/28/21 0330 05/29/21 0347 05/30/21 0408  NA 136 140 139 139 139  K 4.2 4.6 4.3 4.4 4.8  CL 95* 100 95* 89* 90*  CO2 33* 34* 37* 43* 45*  GLUCOSE 206* 137* 136* 128* 149*  BUN 30* 25* 21 23 18   CREATININE 1.18* 0.79 0.73 0.71 0.68  CALCIUM 9.6 9.0 9.5 9.7 9.5    GFR: Estimated Creatinine Clearance: 61.4 mL/min (by C-G formula based on SCr of 0.68 mg/dL).  Liver Function Tests: Recent Labs  Lab 05/25/21 0603  AST 34  ALT 14  ALKPHOS 56  BILITOT 0.7  PROT 7.3  ALBUMIN 4.2    CBG: Recent Labs  Lab 05/26/21 0755 05/27/21 0627 05/28/21 0620 05/29/21 0612 05/30/21 0613  GLUCAP 120* 141* 117* 121* 121*     Recent Results (from the past 240 hour(s))  Resp Panel by RT-PCR (Flu A&B,  Covid) Nasopharyngeal Swab     Status: None   Collection Time: 05/25/21  7:13 AM   Specimen: Nasopharyngeal Swab; Nasopharyngeal(NP) swabs in vial transport medium  Result Value Ref Range Status   SARS Coronavirus 2 by RT PCR NEGATIVE NEGATIVE Final    Comment: (NOTE) SARS-CoV-2 target nucleic acids are NOT DETECTED.  The SARS-CoV-2 RNA is generally detectable in upper respiratory specimens during the acute phase of infection. The lowest concentration of SARS-CoV-2 viral copies this assay can detect is 138 copies/mL. A negative result does not preclude SARS-Cov-2 infection and should not be used as the sole basis for treatment or other patient management decisions. A negative result may occur with  improper specimen collection/handling, submission of specimen other than nasopharyngeal swab, presence of viral mutation(s) within the areas targeted by this assay, and inadequate number of viral copies(<138 copies/mL). A negative result must be combined with clinical observations, patient history, and epidemiological information. The expected  result is Negative.  Fact Sheet for Patients:  EntrepreneurPulse.com.au  Fact Sheet for Healthcare Providers:  IncredibleEmployment.be  This test is no t yet approved or cleared by the [place] FDA and  has been authorized for detection and/or diagnosis of SARS-CoV-2 by FDA under an Emergency Use Authorization (EUA). This EUA will remain  in effect (meaning this test can be used) for the duration of the COVID-19 declaration under Section 564(b)(1) of the Act, 21 U.S.C.section 360bbb-3(b)(1), unless the authorization is terminated  or revoked sooner.       Influenza A by PCR NEGATIVE NEGATIVE Final   Influenza B by PCR NEGATIVE NEGATIVE Final    Comment: (NOTE) The Xpert Xpress SARS-CoV-2/FLU/RSV plus assay is intended as an aid in the diagnosis of influenza from Nasopharyngeal swab specimens and should not be used as a sole basis for treatment. Nasal washings and aspirates are unacceptable for Xpert Xpress SARS-CoV-2/FLU/RSV testing.  Fact Sheet for Patients: EntrepreneurPulse.com.au  Fact Sheet for Healthcare Providers: IncredibleEmployment.be  This test is not yet approved or cleared by the [place] FDA and has been authorized for detection and/or diagnosis of SARS-CoV-2 by FDA under an Emergency Use Authorization (EUA). This EUA will remain in effect (meaning this test can be used) for the duration of the COVID-19 declaration under Section 564(b)(1) of the Act, 21 U.S.C. section 360bbb-3(b)(1), unless the authorization is terminated or revoked.  Performed at [facility], [address], [place], [place] [address]          Radiology Studies: No results found.      Scheduled Meds:  amLODipine  5 mg Oral Daily   citalopram  20 mg Oral Daily   dexamethasone (DECADRON) injection  4 mg Intravenous Q6H   feeding supplement  237 mL Oral TID BM   heparin  5,000 Units Subcutaneous Q8H    levETIRAcetam  250 mg Oral BID   metoprolol tartrate  12.5 mg Oral BID   multivitamin with minerals  1 tablet Oral Daily   nystatin  1 application Topical BID   pantoprazole  40 mg Oral Daily   simvastatin  20 mg Oral q1800   sodium chloride flush  3 mL Intravenous Q12H   umeclidinium-vilanterol  1 puff Inhalation Daily   Continuous Infusions:     LOS: 5 days      Phillips Climes, MD [facility]   To contact the attending provider between 7A-7P or the covering provider during after hours 7P-7A, please log into the web site [email or web address] and access using universal [facility] password for that web site. If you do not have the password, please call the hospital operator.  05/31/2021, 1:56 PM

## 2021-05-31 NOTE — Care Management Important Message (Signed)
Important Message  Patient Details  Name: Barbara Gibson MRN: [id number] Date of Birth: Apr 26, 1946   Medicare Important Message Given:  Yes     Raushanah Osmundson [address] 05/31/2021, 12:48 PM

## 2021-05-31 NOTE — Progress Notes (Signed)
Initial Nutrition Assessment  DOCUMENTATION CODES:   Non-severe (moderate) malnutrition in context of chronic illness  INTERVENTION:  -Continue Ensure Enlive po TID, each supplement provides 350 kcal and 20 grams of protein -Continue MVI with minerals daily  Recommend initiation of bowel regimen as no BM documented since 6/27  NUTRITION DIAGNOSIS:   Moderate Malnutrition related to chronic illness as evidenced by mild muscle depletion, mild fat depletion, moderate muscle depletion  GOAL:   Patient will meet greater than or equal to 90% of their needs  MONITOR:   PO intake, Supplement acceptance, Labs, Weight trends, I & O's, Skin  REASON FOR ASSESSMENT:   Consult Assessment of nutrition requirement/status  ASSESSMENT:   Pt with PMH significant for anxiety, COPD, depression, HLD, CAD, h/o MI, recent UTI, h/o breast Ca s/p lumpectomy admitted with brain mass with edema./encephalopathy, likely meningioma per neurosurgery.  Neurosurgery planning for surgery this coming Friday given pt was on Plavix which needed to be held for 7 days prior to surgery.   Pt sleeping at time of RD visit and did not wake to RD voice/touch. No family at bedside. Discussed pt with RN who reports pt has consistently been like this and also states that pt's family informed her that it is normal for pt to be lethargic and largely unresponsive until around 11am when she becomes fully alert and oriented. Pt's PO intake has been fair (see below). Recommend continue current orders for Ensure to supplement meal intake.    PO Intake: 25-50% x 7 recorded meals (45% average meal intake)  No significant changes to weight history per available weight readings.   No BM documented since 6/27, recommend initiation of bowel regimen   UOP: 1557ml x24 hours  Medications:   amLODipine  5 mg Oral Daily   citalopram  20 mg Oral Daily   dexamethasone (DECADRON) injection  4 mg Intravenous Q6H   feeding supplement   237 mL Oral TID BM   heparin  5,000 Units Subcutaneous Q8H   levETIRAcetam  250 mg Oral BID   metoprolol tartrate  12.5 mg Oral BID   multivitamin with minerals  1 tablet Oral Daily   nystatin  1 application Topical BID   pantoprazole  40 mg Oral Daily   simvastatin  20 mg Oral q1800   sodium chloride flush  3 mL Intravenous Q12H   umeclidinium-vilanterol  1 puff Inhalation Daily   Labs: Recent Labs  Lab 05/28/21 0330 05/29/21 0347 05/30/21 0408  NA 139 139 139  K 4.3 4.4 4.8  CL 95* 89* 90*  CO2 37* 43* 45*  BUN 21 23 18   CREATININE 0.73 0.71 0.68  CALCIUM 9.5 9.7 9.5  GLUCOSE 136* 128* 149*    NUTRITION - FOCUSED PHYSICAL EXAM:  Flowsheet Row Most Recent Value  Orbital Region Mild depletion  Upper Arm Region Mild depletion  Thoracic and Lumbar Region Mild depletion  Buccal Region No depletion  Temple Region Moderate depletion  Clavicle Bone Region Mild depletion  Clavicle and Acromion Bone Region Mild depletion  Scapular Bone Region No depletion  Dorsal Hand Mild depletion  Patellar Region Mild depletion  Anterior Thigh Region Moderate depletion  Posterior Calf Region Moderate depletion  Edema (RD Assessment) Moderate  Hair Reviewed  Eyes Unable to assess  [pt sleeping]  Mouth Unable to assess  [pt sleeping]  Skin Reviewed  Nails Reviewed       Diet Order:   Diet Order  Diet regular Room service appropriate? Yes; Fluid consistency: Thin  Diet effective now                   EDUCATION NEEDS:   No education needs have been identified at this time  Skin:  Skin Assessment: Reviewed RN Assessment  Last BM:  6/27  Height:   Ht Readings from Last 1 Encounters:  05/30/21 5\' 6"  (1.676 m)    Weight:   Wt Readings from Last 1 Encounters:  05/28/21 73.6 kg    BMI:  Body mass index is 26.19 kg/m.  Estimated Nutritional Needs:   Kcal:  1700-1900  Protein:  85-95g  Fluid:  >1.7L   Larkin Ina, MS, RD, LDN  (she/her/hers) RD pager number and weekend/on-call pager number located in [place].

## 2021-05-31 NOTE — Plan of Care (Signed)

## 2021-05-31 NOTE — Progress Notes (Signed)
Physical Therapy Treatment Patient Details Name: Barbara Gibson MRN: [id number] DOB: 06-19-1946 Today's Date: 05/31/2021    History of Present Illness 75 y/o female presented to [facility] on 6/28 with generalized weakness x 10 days, difficulty with executive functioning, and headache. T/f to [facility]. 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 PMH: COPD, anxiety, depression, HLD, MI in 2015    PT Comments    Pt reports feeling tired today but agreeable to therapy. Min A to come to EOB with increased time needed for all mobility. Pt dizzy with initial sitting, VSS. Pt required mod A to stand from bed and min A to step to recliner. Pt performed standing pre gait activities but needed to sit due to fatigue before being able to progress to functional gait. PT will continue to follow.    Follow Up Recommendations  SNF;Supervision/Assistance - 24 hour     Equipment Recommendations  Rolling walker with 5" wheels;3in1 (PT)    Recommendations for Other Services       Precautions / Restrictions Precautions Precautions: Fall Precaution Comments: 4L O2 @ baseline Restrictions Weight Bearing Restrictions: No    Mobility  Bed Mobility Overal bed mobility: Needs Assistance Bed Mobility: Supine to Sit     Supine to sit: Min assist     General bed mobility comments: needed vc's for sequencing. min A/ tactile cues for LE's off bed and min HHA for elevation of trunk away from bed into sitting    Transfers Overall transfer level: Needs assistance Equipment used: None Transfers: Sit to/from [facility] Sit to Stand: Mod assist Stand pivot transfers: Min assist       General transfer comment: therapist directly in front of pt, mod A and increased time for power up. Min A and vc's to step feet to turn to chair. Able to maintain standing to do pregait activities but not long enough for functional  gait  Ambulation/Gait                 Stairs             Wheelchair Mobility    Modified Rankin (Stroke Patients Only)       Balance Overall balance assessment: Needs assistance Sitting-balance support: Feet supported Sitting balance-Leahy Scale: Fair Sitting balance - Comments: trunk flexed and kyphotic posture in sitting   Standing balance support: Single extremity supported Standing balance-Leahy Scale: Poor Standing balance comment: performed standing wt shifting, stepping side to side, and scapular retraction in standing and then pt needed to sit                            Cognition Arousal/Alertness: Awake/alert Behavior During Therapy: Flat affect Overall Cognitive Status: Impaired/Different from baseline Area of Impairment: Attention;Following commands;Safety/judgement;Problem solving;Memory                   Current Attention Level: Sustained Memory: Decreased short-term memory Following Commands: Follows one step commands with increased time Safety/Judgement: Decreased awareness of safety   Problem Solving: Slow processing;Decreased initiation;Requires verbal cues;Requires tactile cues General Comments: needing increased time for following commands. Somewhat fearful with mobility      Exercises General Exercises - Lower Extremity Ankle Circles/Pumps: AROM;Both;20 reps;Seated;Supine    General Comments General comments (skin integrity, edema, etc.): VSS on 4L O2 [place]. Pt with dizziness with initial sitting, improved with time up.  Performed supine LE and UE ROM before sitting up      Pertinent Vitals/Pain Pain Assessment: No/denies pain    Home Living                      Prior Function            PT Goals (current goals can now be found in the care plan section) Acute Rehab PT Goals Patient Stated Goal: Ready to feel better PT Goal Formulation: With patient Time For Goal Achievement: 06/09/21 Potential to  Achieve Goals: Fair Progress towards PT goals: Progressing toward goals    Frequency    Min 2X/week      PT Plan Current plan remains appropriate    Co-evaluation              AM-PAC PT "6 Clicks" Mobility   Outcome Measure  Help needed turning from your back to your side while in a flat bed without using bedrails?: A Little Help needed moving from lying on your back to sitting on the side of a flat bed without using bedrails?: A Little Help needed moving to and from a bed to a chair (including a wheelchair)?: A Little Help needed standing up from a chair using your arms (e.g., wheelchair or bedside chair)?: A Little Help needed to walk in hospital room?: A Little Help needed climbing 3-5 steps with a railing? : A Lot 6 Click Score: 17    End of Session Equipment Utilized During Treatment: Gait belt Activity Tolerance: Patient limited by fatigue Patient left: in chair;with call bell/phone within reach;with chair alarm set;with family/visitor present Nurse Communication: Mobility status PT Visit Diagnosis: Unsteadiness on feet (R26.81);Muscle weakness (generalized) (M62.81);History of falling (Z91.81);Difficulty in walking, not elsewhere classified (R26.2)     Time: 1740-8144 PT Time Calculation (min) (ACUTE ONLY): 27 min  Charges:  $Gait Training: 8-22 mins $Therapeutic Activity: 8-22 mins                     Leighton Roach, PT  Acute Rehab Services  Pager [phone] Office [place] 05/31/2021, 2:59 PM

## 2021-05-31 NOTE — TOC Progression Note (Signed)
Transition of Care [facility]) - Progression Note    Patient Details  Name: Barbara Gibson MRN: [id number] Date of Birth: 04/26/1946  Transition of Care [facility]) CM/SW [place],  Phone Number: 05/31/2021, 12:24 PM  Clinical Narrative:   CSW met with patient and family at bedside to update that [facility] can offer after the patient's surgery is done and patient is medically ready for transition to SNF. Family appreciative of update. Noting that surgery currently planned for Friday. CSW to follow.    Expected Discharge Plan: [place] Barriers to Discharge: Continued Medical Work up  Expected Discharge Plan and Services Expected Discharge Plan: [place] Choice: [place] arrangements for the past 2 months: Single Family Home                                       Social Determinants of Health (SDOH) Interventions    Readmission Risk Interventions No flowsheet data found.

## 2021-05-31 NOTE — TOC Initial Note (Signed)
LATE NOTE SUBMISSION   Transition of Care [facility]) - Initial/Assessment Note    Patient Details  Name: Barbara Gibson MRN: [id number] Date of Birth: [birth year]  Transition of Care [facility]) CM/SW Contact:    Geralynn Ochs, LCSW Phone Number: 05/31/2021, 12:21 PM  Clinical Narrative:     CSW met with patient and family at bedside to discuss recommendation for SNF placement. Patient and family in agreement, preference for [facility] as first choice and [facility] as second choice. Patient and family aware that patient will need to make it through surgery before being ready for SNF. CSW sent referral to [facility] and confirmed that they would be able to offer a bed when patient is medically ready.               Expected Discharge Plan: Skilled Nursing Facility Barriers to Discharge: Continued Medical Work up   Patient Goals and CMS Choice Patient states their goals for this hospitalization and ongoing recovery are:: to get rehab CMS Medicare.gov Compare Post Acute Care list provided to:: Patient Choice offered to / list presented to : Patient, Adult Children  Expected Discharge Plan and Services Expected Discharge Plan: [place] Acute Care Choice: [place] Living arrangements for the past 2 months: Single Family Home                                      Prior Living Arrangements/Services Living arrangements for the past 2 months: Single Family Home Lives with:: Self Patient language and need for interpreter reviewed:: No Do you feel safe going back to the place where you live?: Yes      Need for Family Participation in Patient Care: No (Comment) Care giver support system in place?: No (comment) Current home services: DME Criminal Activity/Legal Involvement Pertinent to Current Situation/Hospitalization: No - Comment as needed  Activities of Daily Living Home Assistive Devices/Equipment: None ADL Screening (condition at  time of admission) Patient's cognitive ability adequate to safely complete daily activities?: Yes Is the patient deaf or have difficulty hearing?: No Does the patient have difficulty seeing, even when wearing glasses/contacts?: No Does the patient have difficulty concentrating, remembering, or making decisions?: Yes Patient able to express need for assistance with ADLs?: Yes Does the patient have difficulty dressing or bathing?: Yes Independently performs ADLs?: No Communication: Independent Dressing (OT): Needs assistance Is this a change from baseline?: Pre-admission baseline Grooming: Needs assistance Is this a change from baseline?: Pre-admission baseline Feeding: Needs assistance Is this a change from baseline?: Pre-admission baseline Bathing: Needs assistance Is this a change from baseline?: Pre-admission baseline Toileting: Needs assistance Is this a change from baseline?: Pre-admission baseline In/Out Bed: Needs assistance Is this a change from baseline?: Pre-admission baseline Walks in Home: Needs assistance Is this a change from baseline?: Pre-admission baseline Does the patient have difficulty walking or climbing stairs?: Yes Weakness of Legs: Both Weakness of Arms/Hands: Both  Permission Sought/Granted Permission sought to share information with : Facility Sport and exercise psychologist, Family Supports Permission granted to share information with : Yes, Verbal Permission Granted  Share Information with NAME: Arby Barrette  Permission granted to share info w AGENCY: SNF  Permission granted to share info w Relationship: Daughter     Emotional Assessment Appearance:: Appears stated age Attitude/Demeanor/Rapport: Engaged Affect (typically observed): Appropriate Orientation: : Oriented to Self, Oriented to Place, Oriented to  Time, Oriented  to Situation Alcohol / Substance Use: Not Applicable Psych Involvement: No (comment)  Admission diagnosis:  Weakness [R53.1] Acute kidney  injury (nontraumatic) (HCC) [N17.9] AKI (acute kidney injury) ([place]) [N17.9] Brain mass [G93.89] Patient Active Problem List   Diagnosis Date Noted   AKI (acute kidney injury) ([place]) 05/25/2021   Weakness 05/25/2021    Class: Acute   Acute lower UTI 05/25/2021   Dehydration 05/25/2021    Class: Acute   Brain mass 05/25/2021    Class: Acute   Encephalopathy 05/25/2021   Osteopenia 03/31/2016   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 [place] [address] Phone: [phone] Fax: [phone]     Social Determinants of Health (SDOH) Interventions    Readmission Risk Interventions No flowsheet data found.

## 2021-06-01 DIAGNOSIS — N390 Urinary tract infection, site not specified: Secondary | ICD-10-CM | POA: Diagnosis not present

## 2021-06-01 NOTE — Progress Notes (Signed)
OT Cancellation Note  Patient Details Name: Barbara Gibson MRN: [id number] DOB: [birth year]   Cancelled Treatment:    Reason Eval/Treat Not Completed: Patient declined, no reason specified;Other (comment) (Pt resting x2 times this AM after bathing and then waiting for her breakfast to arrive. 3:00pm, pt was getting a pedicure from family members and resting. OT to continue to follow.)  Jefferey Pica, OTR/L Acute Rehabilitation Services Pager: [phone] Office: [place] 06/01/2021, 3:01 PM

## 2021-06-01 NOTE — Progress Notes (Signed)
Subjective: Patient reports that she is doing well. No acute events overnight.   Objective: Vital signs in last 24 hours: Temp:  [97.4 F (36.3 C)-98.3 F (36.8 C)] 97.4 F (36.3 C) (07/05 0741) Pulse Rate:  [71-88] 73 (07/05 0741) Resp:  [16-18] 18 (07/05 0741) BP: (128-141)/(47-91) 141/59 (07/05 0741) SpO2:  [90 %-100 %] 90 % (07/05 0741) Weight:  [73.6 kg] 73.6 kg (07/05 0500)  Intake/Output from previous day: 07/04 0701 - 07/05 0700 In: -  Out: 800 [Urine:800] Intake/Output this shift: No intake/output data recorded.  Physical Exam:   Mental Status: Alert, oriented X 4, NAD, thought content appropriate.  Speech appropriate and fluent without evidence of aphasia. Able to follow 3 step commands without difficulty.  No dysarthria present. PERRLA, EOMI. CNs grossly intact. Sensation intact. MAEW with good strength that is symmetric bilaterally. 5/5 BUE/BLE. No drift.     Lab Results: Recent Labs    05/30/21 0408  WBC 6.9  HGB 11.7*  HCT 38.4  PLT 230   BMET Recent Labs    05/30/21 0408  NA 139  K 4.8  CL 90*  CO2 45*  GLUCOSE 149*  BUN 18  CREATININE 0.68  CALCIUM 9.5    Studies/Results: No results found.  Assessment/Plan: 75 y.o. female presenting with generalized weakness, was found to have right frontal anterior skull base mass. 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 Plan for surgical resection Friday, 06/04/2021, with Dr. [name].   -Continue to hold Plavix -Continue PT/OT -DVT and seizure prophylaxis -Decadron 4 q 6  LOS: 6 days     Marvis Moeller, DNP, NP-C 06/01/2021, 10:46 AM

## 2021-06-01 NOTE — Plan of Care (Signed)

## 2021-06-01 NOTE — Progress Notes (Signed)
PROGRESS NOTE    Barbara Gibson  UJW:[id number] DOB: 1946/02/28 DOA: 05/25/2021 PCP: [name], MD    Chief Complaint  Patient presents with   Weakness    Brief Narrative:   Barbara Gibson is a 75 y.o. female with medical history significant of anxiety, COPD, depression, hyperlipidemia, CAD, history of MI, former smoker, history of recent UTI.  History of breast cancer status post lumpectomy, few years ago no radiation or chemo given her poor general health presenting with generalized weaknesses x3 days.  Her work-up significant for brain mass, seen by neurosurgery, metastasis versus primary versus meningioma, plan for surgical resection, she needs to remain 7 days of Lovenox, as well due to scheduling availability surgery is planned this coming Friday, 06/04/2021.  Assessment & Plan:   Principal Problem:   Brain mass Active Problems:   Panlobular emphysema (HCC)   Coronary artery disease involving native coronary artery of native heart without angina pectoris   AKI (acute kidney injury) (HCC)   Weakness   Acute lower UTI   Dehydration   Encephalopathy   Malnutrition of moderate degree   New brain mass with edema/encephalopathy -MRI of the brain revealed 2.7 cm anterior inferior right frontal lobe mass with extensive surrounding edema.  -Neurosurgery input greatly appreciated, findings (differential including meningioma, as well primary malignancy versus metastasis cannot be ruled out, so recommendation is for surgical  intervention, for resection, where as well will be able to obtain biopsy to rule out malignancy). -continue with IV Decadron given evidence of edema and midline shift. -Neurology input greatly appreciated, EEG unremarkable for seizures, but has rhythmic delta slowing in the right frontal region, for now continue with Keppra 250 mg p.o. twice daily, if she has any breakthrough seizures activity, then it can be increased to 500 mg p.o. twice daily -Neurosurgery  input greatly appreciated, plan for surgery this coming Friday given she was on Plavix and it need to be held 7 days before surgery.  History of breast cancer -Status post lumpectomy, no radiation, CT chest/abdomen/pelvis significant for left upper lobe 1.7 x 1 cm subpleural nodule, and smaller pulmonary nodules, have consulted oncology regarding further recommendations.  Left upper lobe subpleural nodule -Please see above discussion, possibly related to malignancy, oncology input appreciated, will await histology from her brain tumor before further recommendations.   AKI (acute kidney injury) ([place]) -This has resolved with IV fluids, she is currently off IV fluids.  Chronic hypoxic respiratory failure - At baseline, on 4 L nasal cannula   lower UTI  -treated with Rocephin   Altered, confused due to acute metabolic encephalopathy  - in the setting of brain mass and UTI -Improving with IV steroids and antibiotics, back at baseline.  COPD/emphysema ([place]) - -As needed DuoNeb, bronchodilators, supplemental oxygen as needed  Coronary artery disease involving native coronary artery of native heart without angina pectoris -Resume home medication including metoprolol, statins, will hold Plavix in case she needs neurosurgery   Hyperlipidemia -continue home medication of simvastatin,   Weakness -PT OT, fall precaution  Moderate protein calorie malnutrition -Continue with supplement, dietitian input greatly appreciated  Goals of care -I have discussed with daughter at bedside, I have informed her patient with very poor baseline in the setting of her known COPD, 4 L at baseline, he has not been ambulating much beside her home, and with the plan for brain surgery, further work-up for her lung tumor which very likely malignant, there is anticipated prolonged recovery with possible a lot of  setbacks, she understands this, but report she and the family decided to take the chance, and they do  understand the risks involved.  DVT prophylaxis: [place] heaprin Code Status: Full Family Communication: sister  at bedside Disposition:   Status is: inpatient  The patient will require care spanning > 2 midnights and should be moved to inpatient because: IV treatments appropriate due to intensity of illness or inability to take PO  Dispo: The patient is from: Home              Anticipated d/c is to: SNF              Patient currently is not medically stable to d/c.  She remains on IV Solu-Medrol, extremely weak, deconditioned, will need SNF placement, and she will need surgical resection of brain mass as well.   Difficult to place patient No       Consultants:  Neurosurgery Oncology   Subjective:  No significant events overnight, she had a good night sleep yesterday, and appetite remains poor, but she is at least trying to drink her Ensure. Objective: Vitals:   05/31/21 2353 06/01/21 0437 06/01/21 0500 06/01/21 0741  BP: (!) 139/53 (!) 128/91  (!) 141/59  Pulse: 75 71  73  Resp: 18 18  18   Temp: 98.3 F (36.8 C) 97.8 F (36.6 C)  (!) 97.4 F (36.3 C)  TempSrc: Oral Oral  Oral  SpO2: 100% 100%  90%  Weight:   73.6 kg   Height:        Intake/Output Summary (Last 24 hours) at 06/01/2021 1211 Last data filed at 06/01/2021 0557 Gross per 24 hour  Intake --  Output 800 ml  Net -800 ml   Filed Weights   05/27/21 0332 05/28/21 0500 06/01/21 0500  Weight: 74.9 kg 73.6 kg 73.6 kg    Examination:  Awake Alert, Oriented X 3, frail. Symmetrical Chest wall movement, Good air movement bilaterally, CTAB RRR,No Gallops,Rubs or new Murmurs, No Parasternal Heave +ve B.Sounds, Abd Soft, No tenderness, No rebound - guarding or rigidity. No Cyanosis, Clubbing or edema, No new Rash or bruise          Data Reviewed: I have personally reviewed following labs and imaging studies  CBC: Recent Labs  Lab 05/26/21 1113 05/27/21 0602 05/28/21 0330 05/29/21 0347 05/30/21 0408   WBC 5.7 6.0 8.3 6.0 6.9  HGB 11.9* 10.6* 11.7* 12.2 11.7*  HCT 37.7 34.1* 37.0 38.7 38.4  MCV 91.1 91.2 91.1 92.4 94.1  PLT 258 241 224 251 518    Basic Metabolic Panel: Recent Labs  Lab 05/26/21 1113 05/27/21 0602 05/28/21 0330 05/29/21 0347 05/30/21 0408  NA 136 140 139 139 139  K 4.2 4.6 4.3 4.4 4.8  CL 95* 100 95* 89* 90*  CO2 33* 34* 37* 43* 45*  GLUCOSE 206* 137* 136* 128* 149*  BUN 30* 25* 21 23 18   CREATININE 1.18* 0.79 0.73 0.71 0.68  CALCIUM 9.6 9.0 9.5 9.7 9.5    GFR: Estimated Creatinine Clearance: 61.4 mL/min (by C-G formula based on SCr of 0.68 mg/dL).  Liver Function Tests: No results for input(s): AST, ALT, ALKPHOS, BILITOT, PROT, ALBUMIN in the last 168 hours.   CBG: Recent Labs  Lab 05/26/21 0755 05/27/21 0627 05/28/21 0620 05/29/21 0612 05/30/21 0613  GLUCAP 120* 141* 117* 121* 121*     Recent Results (from the past 240 hour(s))  Resp Panel by RT-PCR (Flu A&B, Covid) Nasopharyngeal Swab     Status:  None   Collection Time: 05/25/21  7:13 AM   Specimen: Nasopharyngeal Swab; Nasopharyngeal(NP) swabs in vial transport medium  Result Value Ref Range Status   SARS Coronavirus 2 by RT PCR NEGATIVE NEGATIVE Final    Comment: (NOTE) SARS-CoV-2 target nucleic acids are NOT DETECTED.  The SARS-CoV-2 RNA is generally detectable in upper respiratory specimens during the acute phase of infection. The lowest concentration of SARS-CoV-2 viral copies this assay can detect is 138 copies/mL. A negative result does not preclude SARS-Cov-2 infection and should not be used as the sole basis for treatment or other patient management decisions. A negative result may occur with  improper specimen collection/handling, submission of specimen other than nasopharyngeal swab, presence of viral mutation(s) within the areas targeted by this assay, and inadequate number of viral copies(<138 copies/mL). A negative result must be combined with clinical observations,  patient history, and epidemiological information. The expected result is Negative.  Fact Sheet for Patients:  EntrepreneurPulse.com.au  Fact Sheet for Healthcare Providers:  IncredibleEmployment.be  This test is no t yet approved or cleared by the [place] FDA and  has been authorized for detection and/or diagnosis of SARS-CoV-2 by FDA under an Emergency Use Authorization (EUA). This EUA will remain  in effect (meaning this test can be used) for the duration of the COVID-19 declaration under Section 564(b)(1) of the Act, 21 U.S.C.section 360bbb-3(b)(1), unless the authorization is terminated  or revoked sooner.       Influenza A by PCR NEGATIVE NEGATIVE Final   Influenza B by PCR NEGATIVE NEGATIVE Final    Comment: (NOTE) The Xpert Xpress SARS-CoV-2/FLU/RSV plus assay is intended as an aid in the diagnosis of influenza from Nasopharyngeal swab specimens and should not be used as a sole basis for treatment. Nasal washings and aspirates are unacceptable for Xpert Xpress SARS-CoV-2/FLU/RSV testing.  Fact Sheet for Patients: EntrepreneurPulse.com.au  Fact Sheet for Healthcare Providers: IncredibleEmployment.be  This test is not yet approved or cleared by the [place] FDA and has been authorized for detection and/or diagnosis of SARS-CoV-2 by FDA under an Emergency Use Authorization (EUA). This EUA will remain in effect (meaning this test can be used) for the duration of the COVID-19 declaration under Section 564(b)(1) of the Act, 21 U.S.C. section 360bbb-3(b)(1), unless the authorization is terminated or revoked.  Performed at [facility], [address] [place], [place] [address]          Radiology Studies: No results found.      Scheduled Meds:  amLODipine  5 mg Oral Daily   citalopram  20 mg Oral Daily   dexamethasone (DECADRON) injection  4 mg Intravenous Q6H   feeding  supplement  237 mL Oral TID BM   heparin  5,000 Units Subcutaneous Q8H   levETIRAcetam  250 mg Oral BID   metoprolol tartrate  12.5 mg Oral BID   multivitamin with minerals  1 tablet Oral Daily   nystatin  1 application Topical BID   pantoprazole  40 mg Oral Daily   simvastatin  20 mg Oral q1800   sodium chloride flush  3 mL Intravenous Q12H   umeclidinium-vilanterol  1 puff Inhalation Daily   Continuous Infusions:     LOS: 6 days      Phillips Climes, MD [facility]   To contact the attending provider between 7A-7P or the covering provider during after hours 7P-7A, please log into the web site [email or web address] and access using universal [place] password for that web site. If you  do not have the password, please call the hospital operator.  06/01/2021, 12:11 PM

## 2021-06-02 DIAGNOSIS — N179 Acute kidney failure, unspecified: Secondary | ICD-10-CM | POA: Diagnosis not present

## 2021-06-02 LAB — BASIC METABOLIC PANEL
BUN: 29 mg/dL — ABNORMAL HIGH (ref 8–23)
BUN: 27 mg/dL — ABNORMAL HIGH (ref 8–23)
CO2: >50 mmol/L — ABNORMAL HIGH (ref 22–32)
CO2: >50 mmol/L — ABNORMAL HIGH (ref 22–32)
Calcium: 9.5 mg/dL (ref 8.9–10.3)
Calcium: 9.3 mg/dL (ref 8.9–10.3)
Chloride: 82 mmol/L — ABNORMAL LOW (ref 98–111)
Chloride: 82 mmol/L — ABNORMAL LOW (ref 98–111)
Creatinine, Ser: 0.64 mg/dL (ref 0.44–1.00)
Creatinine, Ser: 0.71 mg/dL (ref 0.44–1.00)
GFR, Estimated: >60 mL/min (ref >60)
GFR, Estimated: >60 mL/min (ref >60)
Glucose, Bld: 195 mg/dL — ABNORMAL HIGH (ref 70–99)
Glucose, Bld: 161 mg/dL — ABNORMAL HIGH (ref 70–99)
Potassium: 5.2 mmol/L — ABNORMAL HIGH (ref 3.5–5.1)
Potassium: 4.8 mmol/L (ref 3.5–5.1)
Sodium: 141 mmol/L (ref 135–145)
Sodium: 139 mmol/L (ref 135–145)

## 2021-06-02 LAB — CBC
HCT: 37.7 % (ref 36.0–46.0)
Hemoglobin: 11 g/dL — ABNORMAL LOW (ref 12.0–15.0)
MCH: 28.2 pg (ref 26.0–34.0)
MCHC: 29.2 g/dL — ABNORMAL LOW (ref 30.0–36.0)
MCV: 96.7 fL (ref 80.0–100.0)
Platelets: 201 10*3/uL (ref 150–400)
RBC: 3.9 MIL/uL (ref 3.87–5.11)
RDW: 12.2 % (ref 11.5–15.5)
WBC: 10.8 10*3/uL — ABNORMAL HIGH (ref 4.0–10.5)
nRBC: 0 % (ref 0.0–0.2)

## 2021-06-02 NOTE — Progress Notes (Addendum)
PROGRESS NOTE    Barbara Gibson   VHQ:[id number]  DOB: 06-01-1946  DOA: 05/25/2021 PCP: [name], MD   Brief Narrative:  Barbara Gibson is a 75 year old female with anxiety/depression, COPD, CAD, former smoker, breast cancer status postlumpectomy without radiation or chemo.  She presented to the hospital for generalized weakness for 3 days.  Her work-up revealed a brain mass and neurosurgery was consulted.  Recommended to hold Plavix for 7 days and then proceed with resection on Friday, 06/04/2021.  Decadron started.   Subjective: Mild headache today but states that overall she is feeling better.    Assessment & Plan:   Principal Problem: Right frontal brain mass, left upper lobe lung nodule 1.7 x 1 cm -History of breast cancer that is post left lumpectomy in 02/2016 followed by an aromatase inhibitor x5 years  -Lung nodule suspicious for neoplastic process - Awaiting biopsy on 7/8 due to needing to hold Plavix for 7 days - She is receiving Decadron that she has had for about 2 months is improving - Evaluated by oncology on 6/30, [name]   Active Problems: Metabolic encephalopathy - In setting of UTI - UA from 6/28 was canceled - She was treated with ceftriaxone from 6/28 through 7/1 -EEG 6/29 to evaluate for seizures was suggestive of "cortical dysfunction in right frontal region likely secondary to underlying mass.  Additionally rhythmic delta slowing in frontal region is on the ictal Indur ictal continue home with low potential for seizures" -She was started on Keppra on 6/29 and continues on 250 mg twice daily for seizure prevention    Panlobular emphysema -On Anoro Ellipta as outpatient  AKI - Creatinine 1.76 on 6/28-this has improved to 0.64 after IV fluids   Coronary artery disease involving native coronary artery of native heart without angina pectoris -Continue statin and metoprolol - Holding Plavix  Anxiety/depression Continue citalopram and twice  daily as needed Xanax  Intertrigo - Continue nystatin  Essential hypertension - Continue amlodipine at 5 mg daily and metoprolol at 12.5 mg twice daily  Insomnia - Using trazodone as needed  Abnormal labs: Potassium 5.2 and bicarb greater than 50 this morning-repeating metabolic panel   Time spent in minutes: 35 DVT prophylaxis: heparin injection 5,000 Units Start: 05/25/21 0745 TED hose Start: 05/25/21 0740 SCDs Start: 05/25/21 0740  Code Status: full code Family Communication:  Level of Care: Level of care: Progressive Disposition Plan:  Status is: Inpatient  Remains inpatient appropriate because:Inpatient level of care appropriate due to severity of illness  Dispo: The patient is from: Home              Anticipated d/c is to:  tBD              Patient currently is not medically stable to d/c.   Difficult to place patient No      Consultants:  Neurology NS Oncology Procedures:  EEG Antimicrobials:  Anti-infectives (From admission, onward)    Start     Dose/Rate Route Frequency Ordered Stop   05/25/21 1215  cefTRIAXone (ROCEPHIN) 1 g in sodium chloride 0.9 % 100 mL IVPB  Status:  Discontinued        1 g 200 mL/hr over 30 Minutes Intravenous Every 24 hours 05/25/21 1120 05/28/21 1412        Objective: Vitals:   06/02/21 0500 06/02/21 0721 06/02/21 1236 06/02/21 1556  BP:  (!) 138/53 (!) 138/52 (!) 130/53  Pulse:  75 81 86  Resp:  19 18 (!)  22  Temp:  98.1 F (36.7 C) 98.5 F (36.9 C) 98.7 F (37.1 C)  TempSrc:  Oral Oral Oral  SpO2:  100% 97% 100%  Weight: 71.9 kg     Height:        Intake/Output Summary (Last 24 hours) at 06/02/2021 1651 Last data filed at 06/02/2021 3557 Gross per 24 hour  Intake --  Output 400 ml  Net -400 ml   Filed Weights   05/28/21 0500 06/01/21 0500 06/02/21 0500  Weight: 73.6 kg 73.6 kg 71.9 kg    Examination: General exam: Appears comfortable  HEENT: PERRLA, oral mucosa moist, no sclera icterus or  thrush Respiratory system: Clear to auscultation. Respiratory effort normal. Cardiovascular system: S1 & S2 heard, RRR.   Gastrointestinal system: Abdomen soft, non-tender, nondistended. Normal bowel sounds. Central nervous system: Alert and oriented. No focal neurological deficits. Extremities: No cyanosis, clubbing or edema Skin: No rashes or ulcers Psychiatry:  Mood & affect appropriate.     Data Reviewed: I have personally reviewed following labs and imaging studies  CBC: Recent Labs  Lab 05/27/21 0602 05/28/21 0330 05/29/21 0347 05/30/21 0408 06/02/21 0326  WBC 6.0 8.3 6.0 6.9 10.8*  HGB 10.6* 11.7* 12.2 11.7* 11.0*  HCT 34.1* 37.0 38.7 38.4 37.7  MCV 91.2 91.1 92.4 94.1 96.7  PLT 241 224 251 230 322   Basic Metabolic Panel: Recent Labs  Lab 05/27/21 0602 05/28/21 0330 05/29/21 0347 05/30/21 0408 06/02/21 0326  NA 140 139 139 139 141  K 4.6 4.3 4.4 4.8 5.2*  CL 100 95* 89* 90* 82*  CO2 34* 37* 43* 45* >50*  GLUCOSE 137* 136* 128* 149* 195*  BUN 25* 21 23 18  29*  CREATININE 0.79 0.73 0.71 0.68 0.64  CALCIUM 9.0 9.5 9.7 9.5 9.5   GFR: Estimated Creatinine Clearance: 60.7 mL/min (by C-G formula based on SCr of 0.64 mg/dL). Liver Function Tests: No results for input(s): AST, ALT, ALKPHOS, BILITOT, PROT, ALBUMIN in the last 168 hours. No results for input(s): LIPASE, AMYLASE in the last 168 hours. No results for input(s): AMMONIA in the last 168 hours. Coagulation Profile: No results for input(s): INR, PROTIME in the last 168 hours. Cardiac Enzymes: No results for input(s): CKTOTAL, CKMB, CKMBINDEX, TROPONINI in the last 168 hours. BNP (last 3 results) No results for input(s): PROBNP in the last 8760 hours. HbA1C: No results for input(s): HGBA1C in the last 72 hours. CBG: Recent Labs  Lab 05/27/21 0627 05/28/21 0620 05/29/21 0612 05/30/21 0613  GLUCAP 141* 117* 121* 121*   Lipid Profile: No results for input(s): CHOL, HDL, LDLCALC, TRIG, CHOLHDL,  LDLDIRECT in the last 72 hours. Thyroid Function Tests: No results for input(s): TSH, T4TOTAL, FREET4, T3FREE, THYROIDAB in the last 72 hours. Anemia Panel: No results for input(s): VITAMINB12, FOLATE, FERRITIN, TIBC, IRON, RETICCTPCT in the last 72 hours. Urine analysis:    Component Value Date/Time   COLORURINE YELLOW 05/25/2021 0858   APPEARANCEUR HAZY (A) 05/25/2021 0858   LABSPEC 1.013 05/25/2021 0858   PHURINE 6.0 05/25/2021 0858   GLUCOSEU NEGATIVE 05/25/2021 0858   HGBUR SMALL (A) 05/25/2021 0858   BILIRUBINUR NEGATIVE 05/25/2021 0858   KETONESUR 20 (A) 05/25/2021 0858   PROTEINUR 30 (A) 05/25/2021 0858   NITRITE NEGATIVE 05/25/2021 0858   LEUKOCYTESUR LARGE (A) 05/25/2021 0858   Sepsis Labs: @LABRCNTIP (procalcitonin:4,lacticidven:4) ) Recent Results (from the past 240 hour(s))  Resp Panel by RT-PCR (Flu A&B, Covid) Nasopharyngeal Swab     Status: None   Collection  Time: 05/25/21  7:13 AM   Specimen: Nasopharyngeal Swab; Nasopharyngeal(NP) swabs in vial transport medium  Result Value Ref Range Status   SARS Coronavirus 2 by RT PCR NEGATIVE NEGATIVE Final    Comment: (NOTE) SARS-CoV-2 target nucleic acids are NOT DETECTED.  The SARS-CoV-2 RNA is generally detectable in upper respiratory specimens during the acute phase of infection. The lowest concentration of SARS-CoV-2 viral copies this assay can detect is 138 copies/mL. A negative result does not preclude SARS-Cov-2 infection and should not be used as the sole basis for treatment or other patient management decisions. A negative result may occur with  improper specimen collection/handling, submission of specimen other than nasopharyngeal swab, presence of viral mutation(s) within the areas targeted by this assay, and inadequate number of viral copies(<138 copies/mL). A negative result must be combined with clinical observations, patient history, and epidemiological information. The expected result is  Negative.  Fact Sheet for Patients:  EntrepreneurPulse.com.au  Fact Sheet for Healthcare Providers:  IncredibleEmployment.be  This test is no t yet approved or cleared by the [place] FDA and  has been authorized for detection and/or diagnosis of SARS-CoV-2 by FDA under an Emergency Use Authorization (EUA). This EUA will remain  in effect (meaning this test can be used) for the duration of the COVID-19 declaration under Section 564(b)(1) of the Act, 21 U.S.C.section 360bbb-3(b)(1), unless the authorization is terminated  or revoked sooner.       Influenza A by PCR NEGATIVE NEGATIVE Final   Influenza B by PCR NEGATIVE NEGATIVE Final    Comment: (NOTE) The Xpert Xpress SARS-CoV-2/FLU/RSV plus assay is intended as an aid in the diagnosis of influenza from Nasopharyngeal swab specimens and should not be used as a sole basis for treatment. Nasal washings and aspirates are unacceptable for Xpert Xpress SARS-CoV-2/FLU/RSV testing.  Fact Sheet for Patients: EntrepreneurPulse.com.au  Fact Sheet for Healthcare Providers: IncredibleEmployment.be  This test is not yet approved or cleared by the [place] FDA and has been authorized for detection and/or diagnosis of SARS-CoV-2 by FDA under an Emergency Use Authorization (EUA). This EUA will remain in effect (meaning this test can be used) for the duration of the COVID-19 declaration under Section 564(b)(1) of the Act, 21 U.S.C. section 360bbb-3(b)(1), unless the authorization is terminated or revoked.  Performed at [facility], [address], [place], [place] [address]          Radiology Studies: No results found.    Scheduled Meds:  amLODipine  5 mg Oral Daily   citalopram  20 mg Oral Daily   dexamethasone (DECADRON) injection  4 mg Intravenous Q6H   feeding supplement  237 mL Oral TID BM   heparin  5,000 Units Subcutaneous Q8H    levETIRAcetam  250 mg Oral BID   metoprolol tartrate  12.5 mg Oral BID   multivitamin with minerals  1 tablet Oral Daily   nystatin  1 application Topical BID   pantoprazole  40 mg Oral Daily   simvastatin  20 mg Oral q1800   sodium chloride flush  3 mL Intravenous Q12H   umeclidinium-vilanterol  1 puff Inhalation Daily   Continuous Infusions:   LOS: 7 days      Debbe Odea, MD [facility] Pager: [email or web address] 06/02/2021, 4:51 PM

## 2021-06-02 NOTE — Progress Notes (Signed)
Subjective: Patient reports that she has a mild headache overnight that resolved after receiving a dose of Tylenol;. No acute events overnight.   Objective: Vital signs in last 24 hours: Temp:  [97.5 F (36.4 C)-98.5 F (36.9 C)] 98.1 F (36.7 C) (07/06 0721) Pulse Rate:  [73-94] 75 (07/06 0721) Resp:  [17-20] 19 (07/06 0721) BP: (138-188)/(53-87) 138/53 (07/06 0721) SpO2:  [96 %-100 %] 100 % (07/06 0721) Weight:  [71.9 kg] 71.9 kg (07/06 0500)  Intake/Output from previous day: 07/05 0701 - 07/06 0700 In: -  Out: 400 [Urine:400] Intake/Output this shift: No intake/output data recorded.  Physical Exam: Patient is sitting up in bed eating breakfast in NAD. She is alert, oriented X 4, thought content appropriate.  Speech appropriate and fluent. Follows commands without difficulty. PERRLA, EOMI. CNs grossly intact. Sensation intact. MAEW with good strength that is symmetric bilaterally. 5/5 BUE/BLE. No drift.   Lab Results: Recent Labs    06/02/21 0326  WBC 10.8*  HGB 11.0*  HCT 37.7  PLT 201   BMET Recent Labs    06/02/21 0326  NA 141  K 5.2*  CL 82*  CO2 >50*  GLUCOSE 195*  BUN 29*  CREATININE 0.64  CALCIUM 9.5    Studies/Results: No results found.  Assessment/Plan: 75 y.o. female presenting with generalized weakness, was found to have right frontal anterior skull base mass. 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 Plan for surgical resection Friday, 06/04/2021, with Dr. [name]. Mild headache overnight that responded well to PO Tylenol. Continue plan of care.    -Continue to hold Plavix -Continue PT/OT -DVT and seizure prophylaxis -Decadron 4 q 6   LOS: 7 days     Marvis Moeller, DNP, NP-C 06/02/2021, 9:09 AM

## 2021-06-03 ENCOUNTER — Inpatient Hospital Stay (HOSPITAL_COMMUNITY): Payer: Medicare Other

## 2021-06-03 DIAGNOSIS — R220 Localized swelling, mass and lump, head: Secondary | ICD-10-CM | POA: Diagnosis not present

## 2021-06-03 DIAGNOSIS — G936 Cerebral edema: Secondary | ICD-10-CM | POA: Diagnosis not present

## 2021-06-03 DIAGNOSIS — I672 Cerebral atherosclerosis: Secondary | ICD-10-CM | POA: Diagnosis not present

## 2021-06-03 DIAGNOSIS — N179 Acute kidney failure, unspecified: Secondary | ICD-10-CM | POA: Diagnosis not present

## 2021-06-03 LAB — BASIC METABOLIC PANEL
BUN: 23 mg/dL (ref 8–23)
CO2: >50 mmol/L — ABNORMAL HIGH (ref 22–32)
Calcium: 9.5 mg/dL (ref 8.9–10.3)
Chloride: 85 mmol/L — ABNORMAL LOW (ref 98–111)
Creatinine, Ser: 0.66 mg/dL (ref 0.44–1.00)
GFR, Estimated: >60 mL/min (ref >60)
Glucose, Bld: 188 mg/dL — ABNORMAL HIGH (ref 70–99)
Potassium: 5.5 mmol/L — ABNORMAL HIGH (ref 3.5–5.1)
Sodium: 140 mmol/L (ref 135–145)

## 2021-06-03 MED ORDER — MUPIROCIN 2 % EX OINT
1.0000 "application " | TOPICAL_OINTMENT | Freq: Two times a day (BID) | CUTANEOUS | Status: DC
  Filled 2021-06-03: qty 22

## 2021-06-03 MED ORDER — CEFAZOLIN SODIUM-DEXTROSE 2-4 GM/100ML-% IV SOLN: 2.0000 g | INTRAVENOUS | Status: DC

## 2021-06-03 MED ORDER — ACETAZOLAMIDE 250 MG PO TABS
250.0000 mg | ORAL_TABLET | Freq: Two times a day (BID) | ORAL | Status: DC
  Administered 2021-06-03 – 2021-06-09 (×12): 250 mg via ORAL
  Filled 2021-06-03 (×15): qty 1

## 2021-06-03 NOTE — Progress Notes (Signed)
OT Cancellation Note  Patient Details Name: Barbara Gibson MRN: [id number] DOB: [birth year]   Cancelled Treatment:    Reason Eval/Treat Not Completed: Patient declined, RN in room for pm meds, patient just returned to bed.  OT to continue efforts in the acute setting.    Merritt Mccravy D Alfhild Partch 06/03/2021, 2:00 PM

## 2021-06-03 NOTE — Progress Notes (Signed)
Physical Therapy Treatment Patient Details Name: Barbara Gibson MRN: [id number] DOB: January 13, 1946 Today's Date: 06/03/2021    History of Present Illness 75 y/o female presented to [facility] on 6/28 with generalized weakness x 10 days, difficulty with executive functioning, and headache. T/f to [facility]. 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 PMH: COPD, anxiety, depression, HLD, MI in 2015    PT Comments    Patient disoriented to place this session and difficult to reorient due to Barbara Gibson deficits noted (<30 seconds of memory this session), notified RN of change. Patient reports feeling anxious and scared of upcoming surgery. Provided encouragement and listening ear, patient appreciative. Patient performed sit to stand and stand pivot transfer with modA and Barbara Gibson. Continue to recommend SNF for ongoing Physical Therapy.   Patient may benefit from re-eval following surgery.    Follow Up Recommendations  SNF;Supervision/Assistance - 24 hour     Equipment Recommendations  Rolling Barbara Gibson with 5" wheels;3in1 (PT)    Recommendations for Other Services       Precautions / Restrictions Precautions Precautions: Fall Precaution Comments: 4L O2 @ baseline Restrictions Weight Bearing Restrictions: No    Mobility  Bed Mobility Overal bed mobility: Needs Assistance Bed Mobility: Supine to Sit     Supine to sit: Min assist     General bed mobility comments: cues for sequencing and encouragement. MinA for trunk elevation    Transfers Overall transfer level: Needs assistance Equipment used: 1 person hand held assist Transfers: Sit to/from [facility] Sit to Stand: Mod assist Stand pivot transfers: Mod assist       General transfer comment: modA to stand and steady. ModA to maintain balance during stand pivot transfer due to patient being fearful of mobility  Ambulation/Gait                  Stairs             Wheelchair Mobility    Modified Rankin (Stroke Patients Only)       Balance Overall balance assessment: Needs assistance Sitting-balance support: Feet supported Sitting balance-Leahy Scale: Fair     Standing balance support: Single extremity supported Standing balance-Leahy Scale: Poor Standing balance comment: reliant on UE support and external assist                            Cognition Arousal/Alertness: Awake/alert Behavior During Therapy: Flat affect Overall Cognitive Status: Impaired/Different from baseline Area of Impairment: Attention;Following commands;Safety/judgement;Problem solving;Memory;Orientation                 Orientation Level: Disoriented to;Place Current Attention Level: Sustained Memory: Decreased short-term memory Following Commands: Follows one step commands with increased time Safety/Judgement: Decreased awareness of safety   Problem Solving: Slow processing;Decreased initiation;Requires verbal cues;Requires tactile cues General Comments: disoriented to place this session. Attempts to reorient but unable to maintain orientation >30 seconds. Patient with visual hallucinations looking out of the window of room. Required increased time to follow commands      Exercises      General Comments General comments (skin integrity, edema, etc.): on 4L O2 Barbara Gibson with spO2 100%      Pertinent Vitals/Pain Pain Assessment: No/denies pain    Home Living                      Prior Function  PT Goals (current goals can now be found in the care plan section) Acute Rehab PT Goals Patient Stated Goal: to get surgery done PT Goal Formulation: With patient Time For Goal Achievement: 06/09/21 Potential to Achieve Goals: Fair Progress towards PT goals: Progressing toward goals    Frequency    Min 2X/week      PT Plan Current plan remains appropriate    Co-evaluation               AM-PAC PT "6 Clicks" Mobility   Outcome Measure  Help needed turning from your back to your side while in a flat bed without using bedrails?: A Little Help needed moving from lying on your back to sitting on the side of a flat bed without using bedrails?: A Little Help needed moving to and from a bed to a chair (including a wheelchair)?: A Lot Help needed standing up from a chair using your arms (e.g., wheelchair or bedside chair)?: A Lot Help needed to walk in hospital room?: A Lot Help needed climbing 3-5 steps with a railing? : A Lot 6 Click Score: 14    End of Session Equipment Utilized During Treatment: Gait belt Activity Tolerance: Patient limited by fatigue Patient left: in chair;with call bell/phone within reach;with chair alarm set Nurse Communication: Mobility status;Other (comment) (cognitive change) PT Visit Diagnosis: Unsteadiness on feet (R26.81);Muscle weakness (generalized) (M62.81);History of falling (Z91.81);Difficulty in walking, not elsewhere classified (R26.2)     Time: 2202-5427 PT Time Calculation (min) (ACUTE ONLY): 17 min  Charges:  $Therapeutic Activity: 8-22 mins                     Barbara Gibson A. Barbara Gibson PT, DPT Acute Rehabilitation Services Pager [phone] Office [phone]    Barbara Gibson 06/03/2021, 5:43 PM

## 2021-06-03 NOTE — Progress Notes (Signed)
   Providing Compassionate, Quality Care - Together  NEUROSURGERY PROGRESS NOTE   S: No issues overnight.   O: EXAM:  BP (!) 173/69 (BP Location: Left Arm)   Pulse 80   Temp 98.3 F (36.8 C)   Resp 18   Ht 5\' 6"  (1.676 m)   Wt 68.1 kg   SpO2 99%   BMI 24.23 kg/m   Awake, alert, oriented x3 PERRLA EOMI Speech fluent, appropriate CNs grossly intact 5/5 BUE/BLE  No drift   ASSESSMENT:  75 y.o. 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  Please do not hesitate to call with questions or concerns.   Elwin Sleight, [facility] Cell: [phone]

## 2021-06-03 NOTE — Progress Notes (Signed)
[facility] PROGRESS NOTE    Barbara Gibson  XAJ:[id number] DOB: 27-May-1946 DOA: 05/25/2021 PCP: [name], MD      Brief Narrative:  Barbara Gibson is a 75 y.o. F with advanced COPD, chronic respiratory failure, DCIS, CAD, history of smoking who presented with weakness, found to have brain mass.  [facility] neurosurgery were consulted, the patient was held for 7 days for Plavix washout.     Assessment & Plan:  Right frontal lobe mass No headache or seizures today. -Continue Keppra and dexamethasone - Consult neurosurgery, they will take to the OR tomorrow, and assume care afterward in the intensive care unit, appreciate their expertise  Left upper lobe lung nodule This was found on work-up for the right frontal lobe mass. -Follow-up will depend on pathology from brain mass resection  COPD Chronic respiratory failure on 4 L O2 at home Elevated CO2 Based on degree of CO2 elevation, the patient has very advanced COPD.  I do not see PFTs in our system.  She appears to have dyspnea on exertion with minimal exertion for more than a year. - Continue LABA/LAMA - Albuterol as needed -Continue PPI -Resume home diamox  Hypertension BP elevated here interemittently -Continue amlodipine, metoprolol -Resume benazepril post op when hemodynamically stable  Coronary disease secondary prevention -Continue statin -Hold Plavix  Anxiety - Continue citalopram  -Hold bupropion for seizure risk  Acute metabolic encephalopathy due to brain mass, hypercarbia At baseline the patient is fully functional, independent, has no memory loss, here she was confused, although this is improving with steroids.  AKI Baseline Cr 0.6, here was up to 1.7 on admission, resolved wth fluids.        Disposition: Status is: Inpatient  Remains inpatient appropriate because: pending brain surgery  Dispo: The patient is from: Home              Anticipated d/c is to: SNF               Patient currently is not medically stable to d/c.   Difficult to place patient No       Level of care: Progressive       MDM: The below labs and imaging reports were reviewed and summarized above.  Medication management as above.    DVT prophylaxis: SCD's Start: 06/03/21 1222 heparin injection 5,000 Units Start: 05/25/21 0745 TED hose Start: 05/25/21 0740 SCDs Start: 05/25/21 0740  Code Status: FULL Family Communication:          Subjective: No fever, chest pain, seizures, loss of consciousness, headache, confusion.  Her dyspnea is about her baseline.  Objective: Vitals:   06/03/21 0500 06/03/21 0843 06/03/21 1217 06/03/21 1639  BP:  (!) 158/63 (!) 173/69 (!) 161/59  Pulse:  79 80 93  Resp:  18 18 18   Temp:  98.2 F (36.8 C) 98.3 F (36.8 C) 98 F (36.7 C)  TempSrc:  Axillary  Oral  SpO2:  92% 99% (!) 62%  Weight: 68.1 kg     Height:        Intake/Output Summary (Last 24 hours) at 06/03/2021 1735 Last data filed at 06/03/2021 0437 Gross per 24 hour  Intake 50 ml  Output 1600 ml  Net -1550 ml   Filed Weights   06/01/21 0500 06/02/21 0500 06/03/21 0500  Weight: 73.6 kg 71.9 kg 68.1 kg    Examination: General appearance: Elderly adult female, alert and in mild respiratory distress.   HEENT: Anicteric, conjunctiva pink, lids and  lashes normal. No nasal deformity, discharge, epistaxis.  Lips moist, dentures in place, oropharynx tacky dry, no oral lesions.   Skin: Warm and dry.  no jaundice.  No suspicious rashes or lesions. Cardiac: RRR, nl S1-S2, no murmurs appreciated.  Capillary refill is brisk.  JVPnot visible.  No LE edema.  Radial pulses 2+ and symmetric. Respiratory: Increased respiratory rate.  CTAB without rales or wheezes. Abdomen: Abdomen soft.  no TTP or guarding. No ascites, distension, hepatosplenomegaly.   MSK: No deformities or effusions. Neuro: Awake and alert.  EOMI, moves all extremities. Speech fluent.    Psych: Sensorium intact  and responding to questions, attention normal. Affect normal.  Judgment and insight appear normal.    Data Reviewed: I have personally reviewed following labs and imaging studies:  CBC: Recent Labs  Lab 05/28/21 0330 05/29/21 0347 05/30/21 0408 06/02/21 0326  WBC 8.3 6.0 6.9 10.8*  HGB 11.7* 12.2 11.7* 11.0*  HCT 37.0 38.7 38.4 37.7  MCV 91.1 92.4 94.1 96.7  PLT 224 251 230 924   Basic Metabolic Panel: Recent Labs  Lab 05/29/21 0347 05/30/21 0408 06/02/21 0326 06/02/21 1752 06/03/21 0151  NA 139 139 141 139 140  K 4.4 4.8 5.2* 4.8 5.5*  CL 89* 90* 82* 82* 85*  CO2 43* 45* >50* >50* >50*  GLUCOSE 128* 149* 195* 161* 188*  BUN 23 18 29* 27* 23  CREATININE 0.71 0.68 0.64 0.71 0.66  CALCIUM 9.7 9.5 9.5 9.3 9.5   GFR: Estimated Creatinine Clearance: 56 mL/min (by C-G formula based on SCr of 0.66 mg/dL). Liver Function Tests: No results for input(s): AST, ALT, ALKPHOS, BILITOT, PROT, ALBUMIN in the last 168 hours. No results for input(s): LIPASE, AMYLASE in the last 168 hours. No results for input(s): AMMONIA in the last 168 hours. Coagulation Profile: No results for input(s): INR, PROTIME in the last 168 hours. Cardiac Enzymes: No results for input(s): CKTOTAL, CKMB, CKMBINDEX, TROPONINI in the last 168 hours. BNP (last 3 results) No results for input(s): PROBNP in the last 8760 hours. HbA1C: No results for input(s): HGBA1C in the last 72 hours. CBG: Recent Labs  Lab 05/28/21 0620 05/29/21 0612 05/30/21 0613  GLUCAP 117* 121* 121*   Lipid Profile: No results for input(s): CHOL, HDL, LDLCALC, TRIG, CHOLHDL, LDLDIRECT in the last 72 hours. Thyroid Function Tests: No results for input(s): TSH, T4TOTAL, FREET4, T3FREE, THYROIDAB in the last 72 hours. Anemia Panel: No results for input(s): VITAMINB12, FOLATE, FERRITIN, TIBC, IRON, RETICCTPCT in the last 72 hours. Urine analysis:    Component Value Date/Time   COLORURINE YELLOW 05/25/2021 0858   APPEARANCEUR  HAZY (A) 05/25/2021 0858   LABSPEC 1.013 05/25/2021 0858   PHURINE 6.0 05/25/2021 0858   GLUCOSEU NEGATIVE 05/25/2021 0858   HGBUR SMALL (A) 05/25/2021 0858   BILIRUBINUR NEGATIVE 05/25/2021 0858   KETONESUR 20 (A) 05/25/2021 0858   PROTEINUR 30 (A) 05/25/2021 0858   NITRITE NEGATIVE 05/25/2021 0858   LEUKOCYTESUR LARGE (A) 05/25/2021 0858   Sepsis Labs: @LABRCNTIP (procalcitonin:4,lacticacidven:4)  ) Recent Results (from the past 240 hour(s))  Resp Panel by RT-PCR (Flu A&B, Covid) Nasopharyngeal Swab     Status: None   Collection Time: 05/25/21  7:13 AM   Specimen: Nasopharyngeal Swab; Nasopharyngeal(NP) swabs in vial transport medium  Result Value Ref Range Status   SARS Coronavirus 2 by RT PCR NEGATIVE NEGATIVE Final    Comment: (NOTE) SARS-CoV-2 target nucleic acids are NOT DETECTED.  The SARS-CoV-2 RNA is generally detectable in upper respiratory specimens  during the acute phase of infection. The lowest concentration of SARS-CoV-2 viral copies this assay can detect is 138 copies/mL. A negative result does not preclude SARS-Cov-2 infection and should not be used as the sole basis for treatment or other patient management decisions. A negative result may occur with  improper specimen collection/handling, submission of specimen other than nasopharyngeal swab, presence of viral mutation(s) within the areas targeted by this assay, and inadequate number of viral copies(<138 copies/mL). A negative result must be combined with clinical observations, patient history, and epidemiological information. The expected result is Negative.  Fact Sheet for Patients:  EntrepreneurPulse.com.au  Fact Sheet for Healthcare Providers:  IncredibleEmployment.be  This test is no t yet approved or cleared by the [place] FDA and  has been authorized for detection and/or diagnosis of SARS-CoV-2 by FDA under an Emergency Use Authorization (EUA). This EUA  will remain  in effect (meaning this test can be used) for the duration of the COVID-19 declaration under Section 564(b)(1) of the Act, 21 U.S.C.section 360bbb-3(b)(1), unless the authorization is terminated  or revoked sooner.       Influenza A by PCR NEGATIVE NEGATIVE Final   Influenza B by PCR NEGATIVE NEGATIVE Final    Comment: (NOTE) The Xpert Xpress SARS-CoV-2/FLU/RSV plus assay is intended as an aid in the diagnosis of influenza from Nasopharyngeal swab specimens and should not be used as a sole basis for treatment. Nasal washings and aspirates are unacceptable for Xpert Xpress SARS-CoV-2/FLU/RSV testing.  Fact Sheet for Patients: EntrepreneurPulse.com.au  Fact Sheet for Healthcare Providers: IncredibleEmployment.be  This test is not yet approved or cleared by the [place] FDA and has been authorized for detection and/or diagnosis of SARS-CoV-2 by FDA under an Emergency Use Authorization (EUA). This EUA will remain in effect (meaning this test can be used) for the duration of the COVID-19 declaration under Section 564(b)(1) of the Act, 21 U.S.C. section 360bbb-3(b)(1), unless the authorization is terminated or revoked.  Performed at [facility], [address], [place], [place] [address]          Radiology Studies: CT STEALTH HEAD W/O CONTRAST  Result Date: 06/03/2021 CLINICAL DATA:  Intracranial mass lesion for surgical planning. EXAM: CT HEAD WITHOUT CONTRAST TECHNIQUE: Contiguous axial images were obtained from the base of the skull through the vertex without intravenous contrast. COMPARISON:  MRI 05/25/2021 FINDINGS: Brain: Approximately 3 cm in diameter intra-axial mass of the inferior right frontal lobe is redemonstrated, with regional vasogenic edema and right-to-left shift of approximately 6 mm. No hemorrhagic component. No second lesion is seen. No hydrocephalus. Evidence of ischemic changes. Vascular: There is  atherosclerotic calcification of the major vessels at the base of the brain. Skull: Negative Sinuses/Orbits: Clear/normal Other: None IMPRESSION: 3 cm intra-axial mass in the inferior right frontal lobe with regional vasogenic edema and right-to-left shift of 6 mm. Scan done for surgical planning/guidance. Electronically Signed   By: Nelson Chimes M.D.   On: 06/03/2021 13:30        Scheduled Meds:  amLODipine  5 mg Oral Daily   citalopram  20 mg Oral Daily   dexamethasone (DECADRON) injection  4 mg Intravenous Q6H   feeding supplement  237 mL Oral TID BM   heparin  5,000 Units Subcutaneous Q8H   levETIRAcetam  250 mg Oral BID   metoprolol tartrate  12.5 mg Oral BID   multivitamin with minerals  1 tablet Oral Daily   nystatin  1 application Topical BID   pantoprazole  40  mg Oral Daily   simvastatin  20 mg Oral q1800   sodium chloride flush  3 mL Intravenous Q12H   umeclidinium-vilanterol  1 puff Inhalation Daily   Continuous Infusions:  [START ON 06/04/2021]  ceFAZolin (ANCEF) IV       LOS: 8 days    Time spent: 35 minutes    Edwin Dada, MD [facility] 06/03/2021, 5:35 PM     Please page though [place] or Epic secure chat:  For [employer], Adult nurse

## 2021-06-03 NOTE — Plan of Care (Signed)

## 2021-06-04 ENCOUNTER — Inpatient Hospital Stay (HOSPITAL_COMMUNITY): Payer: Medicare Other

## 2021-06-04 ENCOUNTER — Inpatient Hospital Stay (HOSPITAL_COMMUNITY)
Admission: EM | Disposition: A | Discharge status: Skilled Nursing Facility | Payer: Self-pay | Source: Home / Self Care | Attending: Internal Medicine

## 2021-06-04 DIAGNOSIS — F418 Other specified anxiety disorders: Secondary | ICD-10-CM | POA: Diagnosis not present

## 2021-06-04 DIAGNOSIS — J449 Chronic obstructive pulmonary disease, unspecified: Secondary | ICD-10-CM | POA: Diagnosis not present

## 2021-06-04 DIAGNOSIS — J439 Emphysema, unspecified: Secondary | ICD-10-CM | POA: Diagnosis not present

## 2021-06-04 DIAGNOSIS — I252 Old myocardial infarction: Secondary | ICD-10-CM | POA: Diagnosis not present

## 2021-06-04 DIAGNOSIS — D496 Neoplasm of unspecified behavior of brain: Secondary | ICD-10-CM | POA: Diagnosis not present

## 2021-06-04 HISTORY — PX: CRANIOTOMY: SHX93

## 2021-06-04 HISTORY — PX: APPLICATION OF CRANIAL NAVIGATION: SHX6578

## 2021-06-04 LAB — POCT I-STAT 7, (LYTES, BLD GAS, ICA,H+H)
Acid-Base Excess: 15 mmol/L — ABNORMAL HIGH (ref 0.0–2.0)
Acid-Base Excess: 11 mmol/L — ABNORMAL HIGH (ref 0.0–2.0)
Bicarbonate: 42.2 mmol/L — ABNORMAL HIGH (ref 20.0–28.0)
Bicarbonate: 35.9 mmol/L — ABNORMAL HIGH (ref 20.0–28.0)
Calcium, Ion: 1.18 mmol/L (ref 1.15–1.40)
Calcium, Ion: 1.16 mmol/L (ref 1.15–1.40)
HCT: 31 % — ABNORMAL LOW (ref 36.0–46.0)
HCT: 28 % — ABNORMAL LOW (ref 36.0–46.0)
Hemoglobin: 10.5 g/dL — ABNORMAL LOW (ref 12.0–15.0)
Hemoglobin: 9.5 g/dL — ABNORMAL LOW (ref 12.0–15.0)
O2 Saturation: 100 %
O2 Saturation: 100 %
Patient temperature: 35.9
Patient temperature: 35.9
Potassium: 4.2 mmol/L (ref 3.5–5.1)
Potassium: 4 mmol/L (ref 3.5–5.1)
Sodium: 137 mmol/L (ref 135–145)
Sodium: 131 mmol/L — ABNORMAL LOW (ref 135–145)
TCO2: 44 mmol/L — ABNORMAL HIGH (ref 22–32)
TCO2: 37 mmol/L — ABNORMAL HIGH (ref 22–32)
pCO2 arterial: 67 mmHg (ref 32.0–48.0)
pCO2 arterial: 45 mmHg (ref 32.0–48.0)
pH, Arterial: 7.403 (ref 7.350–7.450)
pH, Arterial: 7.506 — ABNORMAL HIGH (ref 7.350–7.450)
pO2, Arterial: 358 mmHg — ABNORMAL HIGH (ref 83.0–108.0)
pO2, Arterial: 237 mmHg — ABNORMAL HIGH (ref 83.0–108.0)

## 2021-06-04 LAB — TYPE AND SCREEN
ABO/RH(D): A POS
Antibody Screen: NEGATIVE

## 2021-06-04 LAB — ABO/RH: ABO/RH(D): A POS

## 2021-06-04 LAB — SURGICAL PCR SCREEN
MRSA, PCR: NEGATIVE
Staphylococcus aureus: POSITIVE — AB

## 2021-06-04 SURGERY — CRANIOTOMY TUMOR EXCISION
Anesthesia: General | Site: Head | Laterality: Right

## 2021-06-04 MED ORDER — SODIUM CHLORIDE 0.9 % IV SOLN
INTRAVENOUS | Status: DC | PRN
  Administered 2021-06-04: 1000 mg via INTRAVENOUS

## 2021-06-04 MED ORDER — BUPIVACAINE-EPINEPHRINE 0.5% -1:200000 IJ SOLN
INTRAMUSCULAR | Status: AC
  Filled 2021-06-04: qty 1

## 2021-06-04 MED ORDER — 0.9 % SODIUM CHLORIDE (POUR BTL) OPTIME
TOPICAL | Status: DC | PRN
  Administered 2021-06-04: 1000 mL

## 2021-06-04 MED ORDER — ONDANSETRON HCL 4 MG/2ML IJ SOLN
INTRAMUSCULAR | Status: DC | PRN
  Administered 2021-06-04: 4 mg via INTRAVENOUS

## 2021-06-04 MED ORDER — HYDROCODONE-ACETAMINOPHEN 5-325 MG PO TABS
1.0000 | ORAL_TABLET | ORAL | Status: DC | PRN
Start: 2021-06-04 — End: 2021-06-10
  Administered 2021-06-04 – 2021-06-09 (×7): 1 via ORAL
  Filled 2021-06-04 (×8): qty 1

## 2021-06-04 MED ORDER — SODIUM CHLORIDE 0.9 % IV SOLN
0.0125 ug/kg/min | INTRAVENOUS | Status: AC
  Administered 2021-06-04: .2 ug/kg/min via INTRAVENOUS
  Filled 2021-06-04 (×2): qty 2000

## 2021-06-04 MED ORDER — ACETAMINOPHEN 500 MG PO TABS: 1000.0000 mg | ORAL_TABLET | Freq: Once | ORAL | Status: DC | PRN

## 2021-06-04 MED ORDER — FENTANYL CITRATE (PF) 250 MCG/5ML IJ SOLN
INTRAMUSCULAR | Status: AC
  Filled 2021-06-04: qty 5

## 2021-06-04 MED ORDER — CEFAZOLIN SODIUM-DEXTROSE 1-4 GM/50ML-% IV SOLN
1.0000 g | Freq: Three times a day (TID) | INTRAVENOUS | Status: AC
  Administered 2021-06-04 (×2): 1 g via INTRAVENOUS
  Filled 2021-06-04 (×2): qty 50

## 2021-06-04 MED ORDER — DEXAMETHASONE SODIUM PHOSPHATE 10 MG/ML IJ SOLN
INTRAMUSCULAR | Status: DC | PRN
  Administered 2021-06-04: 10 mg via INTRAVENOUS

## 2021-06-04 MED ORDER — THROMBIN 5000 UNITS EX SOLR
CUTANEOUS | Status: AC
  Filled 2021-06-04: qty 10000

## 2021-06-04 MED ORDER — MIDAZOLAM HCL 2 MG/2ML IJ SOLN
INTRAMUSCULAR | Status: AC
  Filled 2021-06-04: qty 2

## 2021-06-04 MED ORDER — ROCURONIUM BROMIDE 10 MG/ML (PF) SYRINGE
PREFILLED_SYRINGE | INTRAVENOUS | Status: AC
  Filled 2021-06-04: qty 10

## 2021-06-04 MED ORDER — LACTATED RINGERS IV SOLN: INTRAVENOUS | Status: DC | PRN

## 2021-06-04 MED ORDER — PROPOFOL 10 MG/ML IV BOLUS
INTRAVENOUS | Status: AC
  Filled 2021-06-04: qty 40

## 2021-06-04 MED ORDER — HEMOSTATIC AGENTS (NO CHARGE) OPTIME
TOPICAL | Status: DC | PRN
  Administered 2021-06-04: 1 via TOPICAL

## 2021-06-04 MED ORDER — PROPOFOL 500 MG/50ML IV EMUL
INTRAVENOUS | Status: DC | PRN
  Administered 2021-06-04: 60 ug/kg/min via INTRAVENOUS

## 2021-06-04 MED ORDER — ACETAMINOPHEN 10 MG/ML IV SOLN: 1000.0000 mg | Freq: Once | INTRAVENOUS | Status: DC | PRN

## 2021-06-04 MED ORDER — SUGAMMADEX SODIUM 200 MG/2ML IV SOLN
INTRAVENOUS | Status: DC | PRN
  Administered 2021-06-04: 200 mg via INTRAVENOUS

## 2021-06-04 MED ORDER — ACETAMINOPHEN 160 MG/5ML PO SOLN: 1000.0000 mg | Freq: Once | ORAL | Status: DC | PRN

## 2021-06-04 MED ORDER — ROCURONIUM BROMIDE 10 MG/ML (PF) SYRINGE
PREFILLED_SYRINGE | INTRAVENOUS | Status: DC | PRN
  Administered 2021-06-04: 80 mg via INTRAVENOUS
  Administered 2021-06-04: 20 mg via INTRAVENOUS

## 2021-06-04 MED ORDER — DEXAMETHASONE SODIUM PHOSPHATE 10 MG/ML IJ SOLN
INTRAMUSCULAR | Status: AC
  Filled 2021-06-04: qty 1

## 2021-06-04 MED ORDER — CHLORHEXIDINE GLUCONATE CLOTH 2 % EX PADS
6.0000 | MEDICATED_PAD | Freq: Every day | CUTANEOUS | Status: DC
  Administered 2021-06-04 – 2021-06-08 (×5): 6 via TOPICAL

## 2021-06-04 MED ORDER — THROMBIN 5000 UNITS EX SOLR: OROMUCOSAL | Status: DC | PRN

## 2021-06-04 MED ORDER — SODIUM CHLORIDE 0.9 % IV SOLN: INTRAVENOUS | Status: DC | PRN

## 2021-06-04 MED ORDER — MORPHINE SULFATE (PF) 2 MG/ML IV SOLN
2.0000 mg | INTRAVENOUS | Status: DC | PRN
  Administered 2021-06-04 – 2021-06-05 (×4): 2 mg via INTRAVENOUS
  Filled 2021-06-04 (×5): qty 1

## 2021-06-04 MED ORDER — LABETALOL HCL 5 MG/ML IV SOLN
INTRAVENOUS | Status: AC
  Filled 2021-06-04: qty 4

## 2021-06-04 MED ORDER — FENTANYL CITRATE (PF) 250 MCG/5ML IJ SOLN
INTRAMUSCULAR | Status: DC | PRN
  Administered 2021-06-04 (×3): 50 ug via INTRAVENOUS

## 2021-06-04 MED ORDER — THROMBIN 20000 UNITS EX KIT: PACK | CUTANEOUS | Status: DC | PRN

## 2021-06-04 MED ORDER — PROPOFOL 1000 MG/100ML IV EMUL
INTRAVENOUS | Status: AC
  Filled 2021-06-04: qty 200

## 2021-06-04 MED ORDER — LIDOCAINE 2% (20 MG/ML) 5 ML SYRINGE
INTRAMUSCULAR | Status: DC | PRN
  Administered 2021-06-04: 60 mg via INTRAVENOUS

## 2021-06-04 MED ORDER — THROMBIN 20000 UNITS EX SOLR
CUTANEOUS | Status: AC
  Filled 2021-06-04: qty 20000

## 2021-06-04 MED ORDER — OXYCODONE HCL 5 MG PO TABS: 5.0000 mg | ORAL_TABLET | Freq: Once | ORAL | Status: DC | PRN

## 2021-06-04 MED ORDER — LABETALOL HCL 5 MG/ML IV SOLN
INTRAVENOUS | Status: DC | PRN
  Administered 2021-06-04: 5 mg via INTRAVENOUS
  Administered 2021-06-04: 20 mg via INTRAVENOUS
  Administered 2021-06-04 (×3): 5 mg via INTRAVENOUS

## 2021-06-04 MED ORDER — BACITRACIN ZINC 500 UNIT/GM EX OINT
TOPICAL_OINTMENT | CUTANEOUS | Status: DC | PRN
  Administered 2021-06-04: 1 via TOPICAL

## 2021-06-04 MED ORDER — PROPOFOL 10 MG/ML IV BOLUS
INTRAVENOUS | Status: DC | PRN
  Administered 2021-06-04: 30 mg via INTRAVENOUS
  Administered 2021-06-04: 100 mg via INTRAVENOUS
  Administered 2021-06-04: 50 mg via INTRAVENOUS

## 2021-06-04 MED ORDER — PHENYLEPHRINE 40 MCG/ML (10ML) SYRINGE FOR IV PUSH (FOR BLOOD PRESSURE SUPPORT)
PREFILLED_SYRINGE | INTRAVENOUS | Status: DC | PRN
  Administered 2021-06-04: 160 ug via INTRAVENOUS
  Administered 2021-06-04: 80 ug via INTRAVENOUS
  Administered 2021-06-04: 160 ug via INTRAVENOUS

## 2021-06-04 MED ORDER — BUPIVACAINE-EPINEPHRINE (PF) 0.5% -1:200000 IJ SOLN
INTRAMUSCULAR | Status: DC | PRN
  Administered 2021-06-04: 10 mL

## 2021-06-04 MED ORDER — LIDOCAINE-EPINEPHRINE 1 %-1:100000 IJ SOLN
INTRAMUSCULAR | Status: DC | PRN
  Administered 2021-06-04: 10 mL

## 2021-06-04 MED ORDER — OXYCODONE HCL 5 MG/5ML PO SOLN: 5.0000 mg | Freq: Once | ORAL | Status: DC | PRN

## 2021-06-04 MED ORDER — LIDOCAINE 2% (20 MG/ML) 5 ML SYRINGE
INTRAMUSCULAR | Status: AC
  Filled 2021-06-04: qty 5

## 2021-06-04 MED ORDER — HEPARIN SODIUM (PORCINE) 5000 UNIT/ML IJ SOLN
5000.0000 [IU] | Freq: Two times a day (BID) | INTRAMUSCULAR | Status: DC
  Administered 2021-06-05 – 2021-06-09 (×10): 5000 [IU] via SUBCUTANEOUS
  Filled 2021-06-04 (×10): qty 1

## 2021-06-04 MED ORDER — SODIUM CHLORIDE 0.9 % IV SOLN: INTRAVENOUS | Status: DC

## 2021-06-04 MED ORDER — FENTANYL CITRATE (PF) 100 MCG/2ML IJ SOLN
INTRAMUSCULAR | Status: AC
  Filled 2021-06-04: qty 2

## 2021-06-04 MED ORDER — ONDANSETRON HCL 4 MG/2ML IJ SOLN
INTRAMUSCULAR | Status: AC
  Filled 2021-06-04: qty 2

## 2021-06-04 MED ORDER — PHENYLEPHRINE HCL-NACL 10-0.9 MG/250ML-% IV SOLN
INTRAVENOUS | Status: DC | PRN
  Administered 2021-06-04: 40 ug/min via INTRAVENOUS

## 2021-06-04 MED ORDER — BACITRACIN ZINC 500 UNIT/GM EX OINT
TOPICAL_OINTMENT | CUTANEOUS | Status: AC
  Filled 2021-06-04: qty 28.35

## 2021-06-04 MED ORDER — ACETAMINOPHEN 10 MG/ML IV SOLN
INTRAVENOUS | Status: AC
  Filled 2021-06-04: qty 100

## 2021-06-04 MED ORDER — LABETALOL HCL 5 MG/ML IV SOLN: 10.0000 mg | INTRAVENOUS | Status: DC | PRN

## 2021-06-04 MED ORDER — DOCUSATE SODIUM 100 MG PO CAPS
100.0000 mg | ORAL_CAPSULE | Freq: Two times a day (BID) | ORAL | Status: DC
  Administered 2021-06-04 – 2021-06-09 (×11): 100 mg via ORAL
  Filled 2021-06-04 (×11): qty 1

## 2021-06-04 MED ORDER — LIDOCAINE-EPINEPHRINE 1 %-1:100000 IJ SOLN
INTRAMUSCULAR | Status: AC
  Filled 2021-06-04: qty 1

## 2021-06-04 MED ORDER — PROMETHAZINE HCL 25 MG PO TABS: 12.5000 mg | ORAL_TABLET | ORAL | Status: DC | PRN

## 2021-06-04 MED ORDER — FENTANYL CITRATE (PF) 100 MCG/2ML IJ SOLN
25.0000 ug | INTRAMUSCULAR | Status: DC | PRN
  Administered 2021-06-04: 25 ug via INTRAVENOUS

## 2021-06-04 MED ORDER — CEFAZOLIN SODIUM-DEXTROSE 2-3 GM-%(50ML) IV SOLR
INTRAVENOUS | Status: DC | PRN
  Administered 2021-06-04: 2 g via INTRAVENOUS

## 2021-06-04 MED ORDER — ACETAMINOPHEN 10 MG/ML IV SOLN
1000.0000 mg | Freq: Once | INTRAVENOUS | Status: AC
  Administered 2021-06-04: 1000 mg via INTRAVENOUS

## 2021-06-04 MED ORDER — MANNITOL 20 % IV SOLN: INTRAVENOUS | Status: DC | PRN

## 2021-06-04 MED ORDER — SODIUM CHLORIDE 0.9 % IV SOLN
0.0125 ug/kg/min | INTRAVENOUS | Status: DC
  Filled 2021-06-04: qty 2000

## 2021-06-04 MED ORDER — MICROFIBRILLAR COLL HEMOSTAT EX PADS: MEDICATED_PAD | CUTANEOUS | Status: DC | PRN

## 2021-06-04 SURGICAL SUPPLY — 88 items
BAG COUNTER SPONGE SURGICOUNT (BAG) ×9 IMPLANT
BAG SPNG CNTER NS LX DISP (BAG) ×6
BAND INSRT 18 STRL LF DISP RB (MISCELLANEOUS) ×4
BAND RUBBER #18 3X1/16 STRL (MISCELLANEOUS) ×6 IMPLANT
BIT DRILL WIRE PASS 1.3MM (BIT) IMPLANT
BLADE CLIPPER SURG (BLADE) ×3 IMPLANT
BUR CARBIDE MATCH 3.0 (BURR) IMPLANT
BUR SPIRAL ROUTER 2.3 (BUR) ×3 IMPLANT
CANISTER SUCT 3000ML PPV (MISCELLANEOUS) ×6 IMPLANT
CARTRIDGE OIL MAESTRO DRILL (MISCELLANEOUS) IMPLANT
COVER BURR HOLE 14 (Orthopedic Implant) ×9 IMPLANT
DIFFUSER DRILL AIR PNEUMATIC (MISCELLANEOUS) IMPLANT
DRAIN JACKSON PRATT 1/4 1325 (MISCELLANEOUS) IMPLANT
DRAIN JACKSON RD 7FR 3/32 (WOUND CARE) IMPLANT
DRAPE MICROSCOPE LEICA (MISCELLANEOUS) ×3 IMPLANT
DRAPE NEUROLOGICAL W/INCISE (DRAPES) ×3 IMPLANT
DRAPE SHEET LG 3/4 BI-LAMINATE (DRAPES) ×3 IMPLANT
DRAPE SURG 17X23 STRL (DRAPES) IMPLANT
DRAPE WARM FLUID 44X44 (DRAPES) ×3 IMPLANT
DRESSING AQUACEL AG SP 3.5X10 (GAUZE/BANDAGES/DRESSINGS) ×2 IMPLANT
DRILL WIRE PASS 1.3MM (BIT)
DRSG AQUACEL AG ADV 3.5X 4 (GAUZE/BANDAGES/DRESSINGS) ×3 IMPLANT
DRSG AQUACEL AG SP 3.5X10 (GAUZE/BANDAGES/DRESSINGS) ×3
DURAPREP 6ML APPLICATOR 50/CS (WOUND CARE) ×3 IMPLANT
ELECT COATED BLADE 2.86 ST (ELECTRODE) ×3 IMPLANT
ELECT REM PT RETURN 9FT ADLT (ELECTROSURGICAL) ×3
ELECTRODE REM PT RTRN 9FT ADLT (ELECTROSURGICAL) ×2 IMPLANT
EVACUATOR SILICONE 100CC (DRAIN) IMPLANT
FORCEPS BIPO MALIS IRRIG 9X1.5 (NEUROSURGERY SUPPLIES) IMPLANT
FORCEPS BIPOLAR SPETZLER 8 1.0 (NEUROSURGERY SUPPLIES) ×3 IMPLANT
GAUZE 4X4 16PLY ~~LOC~~+RFID DBL (SPONGE) ×6 IMPLANT
GAUZE SPONGE 4X4 12PLY STRL (GAUZE/BANDAGES/DRESSINGS) IMPLANT
GLOVE EXAM NITRILE XL STR (GLOVE) IMPLANT
GLOVE SRG 8 PF TXTR STRL LF DI (GLOVE) ×4 IMPLANT
GLOVE SURG LTX SZ8 (GLOVE) ×6 IMPLANT
GLOVE SURG UNDER POLY LF SZ8 (GLOVE) ×6
GOWN STRL REUS W/ TWL LRG LVL3 (GOWN DISPOSABLE) IMPLANT
GOWN STRL REUS W/ TWL XL LVL3 (GOWN DISPOSABLE) ×10 IMPLANT
GOWN STRL REUS W/TWL 2XL LVL3 (GOWN DISPOSABLE) IMPLANT
GOWN STRL REUS W/TWL LRG LVL3 (GOWN DISPOSABLE)
GOWN STRL REUS W/TWL XL LVL3 (GOWN DISPOSABLE) ×15
GRAFT DURAGEN MATRIX 3WX3L (Graft) ×3 IMPLANT
GRAFT DURAGEN MATRIX 3X3 SNGL (Graft) ×2 IMPLANT
HEMOSTAT POWDER KIT SURGIFOAM (HEMOSTASIS) ×3 IMPLANT
HEMOSTAT SNOW SURGICEL 2X4 (HEMOSTASIS) IMPLANT
HEMOSTAT SURGICEL 2X14 (HEMOSTASIS) ×3 IMPLANT
HEMOSTAT SURGICEL 2X4 FIBR (HEMOSTASIS) ×3 IMPLANT
IV NS 1000ML (IV SOLUTION)
IV NS 1000ML BAXH (IV SOLUTION) IMPLANT
KIT BASIN OR (CUSTOM PROCEDURE TRAY) ×3 IMPLANT
KIT TURNOVER KIT B (KITS) ×3 IMPLANT
MARKER SPHERE PSV REFLC 13MM (MARKER) ×6 IMPLANT
NEEDLE HYPO 22GX1.5 SAFETY (NEEDLE) ×3 IMPLANT
NS IRRIG 1000ML POUR BTL (IV SOLUTION) ×9 IMPLANT
OIL CARTRIDGE MAESTRO DRILL (MISCELLANEOUS)
PACK CRANIOTOMY CUSTOM (CUSTOM PROCEDURE TRAY) ×3 IMPLANT
PATTIES SURGICAL .5 X.5 (GAUZE/BANDAGES/DRESSINGS) IMPLANT
PATTIES SURGICAL .5 X3 (DISPOSABLE) IMPLANT
PATTIES SURGICAL 1X1 (DISPOSABLE) IMPLANT
PERFORATOR LRG  14-11MM (BIT) ×1
PERFORATOR LRG 14-11MM (BIT) ×2 IMPLANT
PIN DISTRACTION MAXCESS-C 14 (PIN) ×3 IMPLANT
PIN MAYFIELD SKULL DISP (PIN) ×3 IMPLANT
RETRACTOR LONE STAR DISPOSABLE (INSTRUMENTS) ×9 IMPLANT
SCREW UNIII AXS SD 1.5X4 (Screw) ×36 IMPLANT
SET CARTRIDGE AND TUBING (SET/KITS/TRAYS/PACK) IMPLANT
SET TUBING IRRIGATION DISP (TUBING) IMPLANT
SPONGE NEURO XRAY DETECT 1X3 (DISPOSABLE) IMPLANT
SPONGE SURGIFOAM ABS GEL 100 (HEMOSTASIS) ×3 IMPLANT
STAPLER VISISTAT 35W (STAPLE) ×3 IMPLANT
STOCKINETTE 6  STRL (DRAPES) ×1
STOCKINETTE 6 STRL (DRAPES) ×2 IMPLANT
STRIP CLOSURE SKIN 1/2X4 (GAUZE/BANDAGES/DRESSINGS) ×3 IMPLANT
SUT ETHILON 3 0 FSL (SUTURE) IMPLANT
SUT ETHILON 3 0 PS 1 (SUTURE) IMPLANT
SUT NURALON 4 0 TR CR/8 (SUTURE) ×6 IMPLANT
SUT VIC AB 0 CT1 18XCR BRD8 (SUTURE) ×4 IMPLANT
SUT VIC AB 0 CT1 8-18 (SUTURE) ×6
SUT VIC AB 2-0 CP2 18 (SUTURE) ×6 IMPLANT
SUT VICRYL RAPIDE 4/0 PS 2 (SUTURE) IMPLANT
TIP SHEAR CVD EXTENDED 36KH (INSTRUMENTS) IMPLANT
TOWEL GREEN STERILE (TOWEL DISPOSABLE) ×6 IMPLANT
TOWEL GREEN STERILE FF (TOWEL DISPOSABLE) ×3 IMPLANT
TRAY FOLEY MTR SLVR 16FR STAT (SET/KITS/TRAYS/PACK) ×3 IMPLANT
TUBE CONNECTING 12X1/4 (SUCTIONS) ×3 IMPLANT
UNDERPAD 30X36 HEAVY ABSORB (UNDERPADS AND DIAPERS) IMPLANT
WATER STERILE IRR 1000ML POUR (IV SOLUTION) ×3 IMPLANT
WRENCH TORQUE 36KHZ (INSTRUMENTS) IMPLANT

## 2021-06-04 NOTE — Op Note (Signed)
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 Pieter Partridge C. Marsha Gundlach, DO  ASSISTANT: Dr. Sherley Bounds, MD  SECOND ASSISTANT: Charolette Child, NP  ANESTHESIA: General Endotracheal  EBL: 100 cc  SPECIMENS: Right frontal tumor (frozen specimen consistent with malignancy, likely metastatic of unknown origin)  DRAINS: None  COMPLICATIONS: None  CONDITION: Hemodynamically stable  HISTORY: Barbara Gibson is a 75 y.o. female, right-handed, with a history of COPD that presented with headaches, altered mental status and slight left-sided weakness.  Work-up revealed a right frontal tumor with significant vasogenic edema and midline shift concerning for metastatic lesion versus meningioma.  She does have a history of breast cancer remotely and has been treated.  Further work-up with a CT chest abdomen pelvis revealed small lung nodule concerning for malignancy as well.  Due to her significant vasogenic edema and midline shift, I recommended resection of the right frontal tumor in order to obtain pathology as well as decrease local mass-effect.  All risks, benefits and outcomes were discussed with the patient and her daughters at bedside.  They agreed with surgical intervention.  PROCEDURE IN DETAIL: The patient was brought to the operating room. After induction of general anesthesia, the patient was positioned on the operative table in the supine position. All pressure points were meticulously padded.  The patient's head was placed in the [place] head holder.  She was placed in gentle extension and slight rotation to the left exposing the right  frontotemporal cranium.  The right frontotemporal region was clipped free of hair.  BrainLab was then registered to excellent accuracy and verified with anatomical landmarks.  A frontotemporal incision was planned behind the hairline.  Skin incision was then marked out and prepped and draped in the usual sterile fashion.  Using a 10 blade, incision was made down to the periosteum.  Raney clips were placed along the skin edges.  Using Bovie electrocautery, the temporalis was reflected anteriorly as a myocutaneous flap to the orbital rim on the right.  Using fishhooks, the myocutaneous flap was reflected anteriorly.  A frontotemporal craniotomy was performed with a high-speed drill and elevated in normal fashion.  Epidural hemostasis was achieved with Surgiflo and bipolar cautery.  The dura was then opened in a curvilinear fashion reflected anteriorly.  At this point the microscope was sterilely draped and brought into the field and used for the remainder of the surgery for microdissection.  Confirmed with navigation, the frontal lobe was gently retracted along the anterior skull base and there was cortical abnormality noted in the inferior frontal lobe.  Using bipolar cautery, a corticectomy was performed and the tumor was encountered.  The tumor was noted to be somewhat fibrous and mucinous appearing.  Using bipolar cautery, microscissors and microdissection, the tumor was circumferentially dissected from the surrounding white matter and cortex.  Peritumoral feeders were coagulated and cut with bipolar cautery and microscissors.  A small specimen was taken from the tumor and sent for frozen pathology.  Once the tumor was circumferentially dissected from the white matter, it was gently elevated from the surgical cavity and sent for permanent pathology.  The surgical cavity was inspected posteriorly, laterally, medially and anteriorly and there is no remaining obvious tumor noted.  Hemostasis was  achieved with  bipolar cautery and Surgi-Flo.  The resection cavity was then filled with Surgiflo and a cottonball and allowed to [facility] for minutes.  The bone flap was then plated the cranial plating system.  The cotton ball was removed and the surgical cavity was copiously irrigated.  It was noted to be excellently hemostatic.  The surgical resection cavity was lined with Surgicel.  The dura was then closed with 4-0 nylon sutures interrupted fashion.  The dura was then covered with DuraGen.  Epidural hemostasis was confirmed with irrigation.  The bone flap was then replaced in its original orientation.  Retractors were taken out of the wound.  Temporalis hemostasis was achieved with bipolar cautery.  The temporalis fascia was closed with 0 Vicryl sutures.  The raney clips were removed and the galea was then closed with 2-0 Vicryl sutures.  The skin was closed with staples.  Sterile dressing was applied.  At the end of the case all sponge, needle, and instrument counts were correct.   The drapes were taken down.  The patient's head was removed from [place] head holder.  Pin sites were stable.  The patient was then transferred to the stretcher, extubated, and taken to the post-anesthesia care unit in stable hemodynamic condition.

## 2021-06-04 NOTE — Anesthesia Preprocedure Evaluation (Signed)
Anesthesia Evaluation  Patient identified by MRN, date of birth, ID band Patient awake    Reviewed: Allergy & Precautions, NPO status , Patient's Chart, lab work & pertinent test results  History of Anesthesia Complications Negative for: history of anesthetic complications  Airway Mallampati: II  TM Distance: >3 FB Neck ROM: Full    Dental  (+) Upper Dentures, Dental Advisory Given   Pulmonary shortness of breath, COPD,  COPD inhaler and oxygen dependent, former smoker,     + decreased breath sounds      Cardiovascular + CAD and + Past MI   Rhythm:Regular     Neuro/Psych PSYCHIATRIC DISORDERS Anxiety Depression Intracranial mass    GI/Hepatic negative GI ROS,   Endo/Other    Renal/GU negative Renal ROSLab Results      Component                Value               Date                      K                        5.5 (H)             06/03/2021            Lab Results      Component                Value               Date                      CREATININE               0.66                06/03/2021                Musculoskeletal negative musculoskeletal ROS (+)   Abdominal   Peds  Hematology  (+) Blood dyscrasia, anemia , Lab Results      Component                Value               Date                      WBC                      10.8 (H)            06/02/2021                HGB                      11.0 (L)            06/02/2021                HCT                      37.7                06/02/2021                MCV  96.7                06/02/2021                PLT                      201                 06/02/2021              Anesthesia Other Findings   Reproductive/Obstetrics                             Anesthesia Physical Anesthesia Plan  ASA: 3  Anesthesia Plan: General   Post-op Pain Management:    Induction: Intravenous  PONV Risk Score and  Plan: 3 and Ondansetron, Dexamethasone, Propofol infusion and TIVA  Airway Management Planned: Oral ETT  Additional Equipment: Arterial line  Intra-op Plan:   Post-operative Plan: Possible Post-op intubation/ventilation  Informed Consent: I have reviewed the patients History and Physical, chart, labs and discussed the procedure including the risks, benefits and alternatives for the proposed anesthesia with the patient or authorized representative who has indicated his/her understanding and acceptance.     Dental advisory given  Plan Discussed with: CRNA and Surgeon  Anesthesia Plan Comments:         Anesthesia Quick Evaluation

## 2021-06-04 NOTE — Progress Notes (Signed)
Pt has had CHG bath per order.

## 2021-06-04 NOTE — Anesthesia Procedure Notes (Addendum)
Central Venous Catheter Insertion Performed by: Oleta Mouse, MD, anesthesiologist Start/End7/06/2021 8:19 AM, 06/04/2021 8:27 AM Patient location: OR. Preanesthetic checklist: patient identified, IV checked, site marked, risks and benefits discussed, surgical consent, monitors and equipment checked, pre-op evaluation, timeout performed and anesthesia consent Position: supine Hand hygiene performed  and maximum sterile barriers used  Catheter size: 8 Fr Total catheter length 16. Central line was placed.Double lumen Procedure performed using ultrasound guided technique. Ultrasound Notes:anatomy identified, needle tip was noted to be adjacent to the nerve/plexus identified, no ultrasound evidence of intravascular and/or intraneural injection and image(s) printed for medical record Attempts: 1 Following insertion, dressing applied, line sutured and Biopatch. Post procedure assessment: blood return through all ports and free fluid flow  Patient tolerated the procedure well with no immediate complications.

## 2021-06-04 NOTE — Anesthesia Procedure Notes (Addendum)
Procedure Name: Intubation Date/Time: 06/04/2021 8:19 AM Performed by: Bryson Corona, CRNA Pre-anesthesia Checklist: Patient identified, Emergency Drugs available, Suction available and Patient being monitored Patient Re-evaluated:Patient Re-evaluated prior to induction Oxygen Delivery Method: Circle System Utilized Preoxygenation: Pre-oxygenation with 100% oxygen Induction Type: IV induction Ventilation: Mask ventilation without difficulty Laryngoscope Size: Mac and 3 Grade View: Grade I Tube type: Oral Tube size: 7.0 mm Number of attempts: 1 Airway Equipment and Method: Stylet Placement Confirmation: ETT inserted through vocal cords under direct vision, positive ETCO2 and breath sounds checked- equal and bilateral Secured at: 21 cm Tube secured with: Tape Dental Injury: Teeth and Oropharynx as per pre-operative assessment  Comments: Performed by Heide Scales, SRNA under direct supervision by Dr. Ermalene Postin and Elmer Sow, CRNA

## 2021-06-04 NOTE — Anesthesia Procedure Notes (Signed)
Arterial Line Insertion Start/End7/06/2021 8:39 AM, 06/04/2021 8:49 AM Performed by: Oleta Mouse, MD, anesthesiologist  Patient location: OR. Preanesthetic checklist: patient identified, IV checked, site marked, risks and benefits discussed, surgical consent, monitors and equipment checked, pre-op evaluation, timeout performed and anesthesia consent Right, brachial was placed Catheter size: 20 G Hand hygiene performed  and maximum sterile barriers used   Attempts: 1 Procedure performed using ultrasound guided technique. Ultrasound Notes:anatomy identified, needle tip was noted to be adjacent to the nerve/plexus identified, no ultrasound evidence of intravascular and/or intraneural injection and image(s) printed for medical record Following insertion, dressing applied and Biopatch. Post procedure assessment: normal and unchanged  Patient tolerated the procedure well with no immediate complications.

## 2021-06-04 NOTE — Progress Notes (Signed)
[id number] report called to Filbert Schilder, CRNA.

## 2021-06-04 NOTE — Transfer of Care (Signed)
Immediate Anesthesia Transfer of Care Note  Patient: Barbara Gibson  Procedure(s) Performed: RIGHT FRONTAL CRANIOTOMY FOR RESECTION OF TUMOR (Right: Head) APPLICATION OF CRANIAL NAVIGATION  Patient Location: PACU  Anesthesia Type:General  Level of Consciousness: awake and alert   Airway & Oxygen Therapy: Patient Spontanous Breathing and Patient connected to face mask oxygen  Post-op Assessment: Report given to RN, Post -op Vital signs reviewed and stable and Patient moving all extremities  Post vital signs: Reviewed and stable  Last Vitals:  Vitals Value Taken Time  BP 147/58 06/04/21 1132  Temp 36.6 C 06/04/21 1130  Pulse 76 06/04/21 1133  Resp 24 06/04/21 1133  SpO2 100 % 06/04/21 1133  Vitals shown include unvalidated device data.  Last Pain:  Vitals:   06/03/21 2034  TempSrc: Oral  PainSc:       Patients Stated Pain Goal: 0 (03/55/97 4163)  Complications: No notable events documented.

## 2021-06-04 NOTE — Progress Notes (Signed)
   Providing Compassionate, Quality Care - Together  NEUROSURGERY PROGRESS NOTE   S: Patient seen and examined in recovery.  No complaints at this time  O: EXAM:  BP (!) 164/59 (BP Location: Left Arm)   Pulse 79   Temp 97.9 F (36.6 C)   Resp (!) 22   Ht 5\' 6"  (1.676 m)   Wt 71.2 kg   SpO2 97%   BMI 25.34 kg/m   Awake, alert, Dressing clean dry and intact PERRLA EOMI Speech fluent, appropriate  CNs grossly intact  5/5 BUE/BLE   ASSESSMENT:  75 y.o. female with   Right frontal metastatic lesion  -Status post right frontal craniotomy resection of tumor 06/04/2021  PLAN: -PT/OT -ICU -SBP less than 150 -Keppra -Wean Decadron -Postop MRI tomorrow    Thank you for allowing me to participate in this patient's care.  Please do not hesitate to call with questions or concerns.   Elwin Sleight, [facility] Cell: [phone]

## 2021-06-04 NOTE — Progress Notes (Signed)
   Providing Compassionate, Quality Care - Together  NEUROSURGERY PROGRESS NOTE   S: no events, s/e in preop  O: EXAM:  BP (!) 164/59 (BP Location: Left Arm)   Pulse 72   Temp 98.2 F (36.8 C)   Resp 17   Ht 5\' 6"  (1.676 m)   Wt 71.2 kg   SpO2 100%   BMI 25.34 kg/m   Awake, alert, oriented  PERRL Speech fluent, appropriate  CNs grossly intact  5/5 BUE/BLE   ASSESSMENT:  75 y.o. 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 Including possible VDRF given her COPD. -postop to icu       Thank you for allowing me to participate in this patient's care.  Please do not hesitate to call with questions or concerns.   Elwin Sleight, [facility] Cell: [phone]

## 2021-06-05 ENCOUNTER — Inpatient Hospital Stay (HOSPITAL_COMMUNITY): Payer: Medicare Other

## 2021-06-05 DIAGNOSIS — G936 Cerebral edema: Secondary | ICD-10-CM | POA: Diagnosis not present

## 2021-06-05 LAB — BASIC METABOLIC PANEL
Anion gap: 5 (ref 5–15)
BUN: 28 mg/dL — ABNORMAL HIGH (ref 8–23)
CO2: 39 mmol/L — ABNORMAL HIGH (ref 22–32)
Calcium: 9.2 mg/dL (ref 8.9–10.3)
Chloride: 95 mmol/L — ABNORMAL LOW (ref 98–111)
Creatinine, Ser: 0.77 mg/dL (ref 0.44–1.00)
GFR, Estimated: >60 mL/min (ref >60)
Glucose, Bld: 136 mg/dL — ABNORMAL HIGH (ref 70–99)
Potassium: 4.7 mmol/L (ref 3.5–5.1)
Sodium: 139 mmol/L (ref 135–145)

## 2021-06-05 LAB — CBC
HCT: 35.3 % — ABNORMAL LOW (ref 36.0–46.0)
Hemoglobin: 10.8 g/dL — ABNORMAL LOW (ref 12.0–15.0)
MCH: 29.5 pg (ref 26.0–34.0)
MCHC: 30.6 g/dL (ref 30.0–36.0)
MCV: 96.4 fL (ref 80.0–100.0)
Platelets: 181 10*3/uL (ref 150–400)
RBC: 3.66 MIL/uL — ABNORMAL LOW (ref 3.87–5.11)
RDW: 12.3 % (ref 11.5–15.5)
WBC: 13.2 10*3/uL — ABNORMAL HIGH (ref 4.0–10.5)
nRBC: 0 % (ref 0.0–0.2)

## 2021-06-05 MED ORDER — ORAL CARE MOUTH RINSE
15.0000 mL | Freq: Two times a day (BID) | OROMUCOSAL | Status: DC
  Administered 2021-06-05 – 2021-06-09 (×9): 15 mL via OROMUCOSAL

## 2021-06-05 MED ORDER — GADOBUTROL 1 MMOL/ML IV SOLN
7.5000 mL | Freq: Once | INTRAVENOUS | Status: AC | PRN
  Administered 2021-06-05: 7.5 mL via INTRAVENOUS

## 2021-06-05 NOTE — Progress Notes (Signed)
NEUROSURGERY PROGRESS NOTE  S/p crani for tumor resection 7/8 No acute events overnight  Doing well. Complains of appropriate headaches Good strength and sensation Incision CDI  Temp:  [97 F (36.1 C)-98.9 F (37.2 C)] 97.8 F (36.6 C) (07/09 0800) Pulse Rate:  [73-82] 80 (07/09 0909) Resp:  [16-25] 17 (07/09 0600) BP: (108-152)/(43-85) 139/51 (07/09 0909) SpO2:  [86 %-100 %] 97 % (07/09 0600) Arterial Line BP: (106-177)/(31-119) 177/61 (07/09 0600) Weight:  [73 kg] 73 kg (07/08 1330)  Plan: Continue pain management today.  MRI this morning showed good resection of the tumor.  Therapies today  Barbara Chiquito, NP 06/05/2021 10:08 AM

## 2021-06-05 NOTE — Evaluation (Signed)
Physical Therapy Re-Evaluation Patient Details Name: Barbara Gibson MRN: [id number] DOB: 06-17-1946 Today's Date: 06/05/2021   History of Present Illness  75 y/o female presented to [facility] ED on 6/28 with generalized weakness x 10 days, difficulty with executive functioning, and headache. T/f to [facility]. 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 S/p R frontal crani with resection of tumor 7/8. PMH: COPD, anxiety, depression, HLD, MI in 2015   Clinical Impression  Pt presents with condition above and deficits mentioned below, see PT Problem List. Pt now s/p R crani and tumor resection 7/8. She is currently displaying improved independence and safety with functional mobility, only needing minA for transfers and very short bedroom distance gait bouts while using a RW. She still displays deficits in lower extremity strength (R weaker than L), lower extremity coordination (L>R), balance, cognition, aerobic endurance, and activity tolerance that place her at high risk for falls and injuries. Will continue to follow acutely. Updated goals. Continue to recommend short-term SNF rehab stay prior to d/c home with family.    Follow Up Recommendations SNF;Supervision/Assistance - 24 hour    Equipment Recommendations  Rolling walker with 5" wheels;3in1 (PT)    Recommendations for Other Services       Precautions / Restrictions Precautions Precautions: Fall Precaution Comments: 4L O2 @ baseline Restrictions Weight Bearing Restrictions: No      Mobility  Bed Mobility Overal bed mobility: Needs Assistance Bed Mobility: Supine to Sit     Supine to sit: Min assist;HOB elevated     General bed mobility comments: Pt needing assistance to find and place R hand on R bed rail, initially over shooting it. MinA to ascend trunk, cuing pt to manage legs off EOB.    Transfers Overall transfer level: Needs assistance Equipment used:  Rolling walker (2 wheeled) Transfers: Sit to/from [facility] Sit to Stand: Min assist Stand pivot transfers: Min assist       General transfer comment: Sit to stand 1x from EOB and 1x from commode with minA, cuing for hand placement. MinA to stand step to L with RW, cuing on RW management and stepping posteriorly to square up with commode prior to sitting.  Ambulation/Gait Ambulation/Gait assistance: Min assist Gait Distance (Feet): 7 Feet (x2 bouts of ~3 ft > ~7 ft) Assistive device: Rolling walker (2 wheeled) Gait Pattern/deviations: Decreased stride length;Shuffle;Trunk flexed;Wide base of support;Step-through pattern Gait velocity: decreased Gait velocity interpretation: <1.31 ft/sec, indicative of household ambulator General Gait Details: MinA to steady as pt displays trunk sway and intermittent posterior LOB, fatigues quickly. Cues provided for RW management with turning. Decreased bil step length and feet clearance.  Stairs            Wheelchair Mobility    Modified Rankin (Stroke Patients Only) Modified Rankin (Stroke Patients Only) Pre-Morbid Rankin Score: Moderate disability Modified Rankin: Moderately severe disability     Balance Overall balance assessment: Needs assistance Sitting-balance support: Feet supported Sitting balance-Leahy Scale: Fair Sitting balance - Comments: trunk flexed and kyphotic posture in sitting   Standing balance support: Bilateral upper extremity supported Standing balance-Leahy Scale: Poor Standing balance comment: needing external assist                             Pertinent Vitals/Pain Pain Assessment: No/denies pain    Home Living Family/patient expects to be discharged to:: Private residence  Living Arrangements: Children Available Help at Discharge: Family;Available 24 hours/day;Other (Comment) (Son lives with her, but is generally not around.  Daughter's can provide 24 hour assist.) Type of  Home: House Home Access: Stairs to enter Entrance Stairs-Rails: None Entrance Stairs-Number of Steps: 2 Home Layout: One level Home Equipment: Bedside commode;Shower seat Additional Comments: Pt reporting she plans to live with her sister after the SNF.    Prior Function Level of Independence: Needs assistance   Gait / Transfers Assistance Needed: Walking without use of an AD per daughter  ADL's / Homemaking Assistance Needed: Needing increasing assist over the last month per daughter for: bathing/dressing/home management, and bill payment.  Comments: daughter reports fall x 1 in last 2-3 weeks     Hand Dominance   Dominant Hand: Right    Extremity/Trunk Assessment   Upper Extremity Assessment Upper Extremity Assessment: Defer to OT evaluation    Lower Extremity Assessment Lower Extremity Assessment: Generalized weakness (decreased coordination noted with bil simultaneous feet tapping (L appeared to have dysdiadochokinesia); R slightly weaker than L with MMT, but both grossly weak)    Cervical / Trunk Assessment Cervical / Trunk Assessment: Kyphotic  Communication   Communication: No difficulties  Cognition Arousal/Alertness: Awake/alert Behavior During Therapy: Flat affect Overall Cognitive Status: Impaired/Different from baseline Area of Impairment: Following commands;Safety/judgement;Awareness;Problem solving                       Following Commands: Follows one step commands with increased time Safety/Judgement: Decreased awareness of safety Awareness: Emergent Problem Solving: Slow processing;Requires verbal cues;Difficulty sequencing General Comments: Pt A&Ox4. Poor sequencing, needing cues to perform tasks step-by-step. Poor awareness of surroundings with poor problem-solving to turn her head to find objects to avoid or use.      General Comments General comments (skin integrity, edema, etc.): 4L O2 via [place] throughout with SpO2 dropping to 70s% with  mobility but rebounding to >/= 90% quickly with rest breaks and pursed lip breathing    Exercises Other Exercises Other Exercises: Pursed lip breathing   Assessment/Plan    PT Assessment Patient needs continued PT services  PT Problem List Decreased strength;Decreased activity tolerance;Decreased balance;Decreased mobility;Decreased cognition;Decreased knowledge of use of DME;Decreased safety awareness;Decreased knowledge of precautions;Cardiopulmonary status limiting activity;Decreased range of motion;Decreased coordination       PT Treatment Interventions DME instruction;Gait training;Functional mobility training;Stair training;Therapeutic activities;Therapeutic exercise;Balance training;Patient/family education;Neuromuscular re-education;Cognitive remediation    PT Goals (Current goals can be found in the Care Plan section)  Acute Rehab PT Goals Patient Stated Goal: to go live with sister after SNF PT Goal Formulation: With patient Time For Goal Achievement: 06/19/21 Potential to Achieve Goals: Fair    Frequency Min 2X/week   Barriers to discharge        Co-evaluation               AM-PAC PT "6 Clicks" Mobility  Outcome Measure Help needed turning from your back to your side while in a flat bed without using bedrails?: A Little Help needed moving from lying on your back to sitting on the side of a flat bed without using bedrails?: A Little Help needed moving to and from a bed to a chair (including a wheelchair)?: A Little Help needed standing up from a chair using your arms (e.g., wheelchair or bedside chair)?: A Little Help needed to walk in hospital room?: A Little Help needed climbing 3-5 steps with a railing? : A Lot 6 Click Score: 17  End of Session Equipment Utilized During Treatment: Gait belt;Oxygen Activity Tolerance: Patient limited by fatigue Patient left: with call bell/phone within reach;in chair;with chair alarm set;with nursing/sitter in  room Nurse Communication: Mobility status;Other (comment) (sats) PT Visit Diagnosis: Unsteadiness on feet (R26.81);Muscle weakness (generalized) (M62.81);History of falling (Z91.81);Difficulty in walking, not elsewhere classified (R26.2);Other abnormalities of gait and mobility (R26.89);Other symptoms and signs involving the nervous system (R29.898)    Time: 3754-3606 PT Time Calculation (min) (ACUTE ONLY): 35 min   Charges:   PT Evaluation $PT Re-evaluation: 1 Re-eval PT Treatments $Therapeutic Activity: 8-22 mins        Moishe Spice, PT, DPT Acute Rehabilitation Services  Pager: [phone] Office: [place] 06/05/2021, 5:53 PM

## 2021-06-06 NOTE — Plan of Care (Signed)
Patient alert and oriented x4, strength equal in all extremities, pupils are equal and reactive. BP parameters maintained today. No bowel movement. Adequate UOP. Bath given and patient tolerated well. Skin assessed and no new injuries found. Family at bedside and updated on plan of care. Vital signs stable. Will continue to monitor   Problem: Education: Goal: Knowledge of General Education information will improve Description: Including pain rating scale, medication(s)/side effects and non-pharmacologic comfort measures 06/06/2021 1512 by Idolina Primer, RN Outcome: Progressing 06/06/2021 1446 by Idolina Primer, RN Outcome: Progressing   Problem: Health Behavior/Discharge Planning: Goal: Ability to manage health-related needs will improve 06/06/2021 1512 by Idolina Primer, RN Outcome: Progressing 06/06/2021 1446 by Idolina Primer, RN Outcome: Progressing   Problem: Clinical Measurements: Goal: Ability to maintain clinical measurements within normal limits will improve 06/06/2021 1512 by Idolina Primer, RN Outcome: Progressing 06/06/2021 1446 by Idolina Primer, RN Outcome: Progressing Goal: Will remain free from infection 06/06/2021 1512 by Idolina Primer, RN Outcome: Progressing 06/06/2021 1446 by Idolina Primer, RN Outcome: Progressing Goal: Diagnostic test results will improve 06/06/2021 1512 by Idolina Primer, RN Outcome: Progressing 06/06/2021 1446 by Idolina Primer, RN Outcome: Progressing Goal: Respiratory complications will improve 06/06/2021 1512 by Idolina Primer, RN Outcome: Progressing 06/06/2021 1446 by Idolina Primer, RN Outcome: Progressing Goal: Cardiovascular complication will be avoided 06/06/2021 1512 by Idolina Primer, RN Outcome: Progressing 06/06/2021 1446 by Idolina Primer, RN Outcome: Progressing   Problem: Activity: Goal: Risk for activity intolerance will decrease 06/06/2021 1512 by Idolina Primer, RN Outcome: Progressing 06/06/2021 1446 by Idolina Primer, RN Outcome:  Progressing   Problem: Nutrition: Goal: Adequate nutrition will be maintained 06/06/2021 1512 by Idolina Primer, RN Outcome: Progressing 06/06/2021 1446 by Idolina Primer, RN Outcome: Progressing   Problem: Coping: Goal: Level of anxiety will decrease 06/06/2021 1512 by Idolina Primer, RN Outcome: Progressing 06/06/2021 1446 by Idolina Primer, RN Outcome: Progressing   Problem: Elimination: Goal: Will not experience complications related to bowel motility 06/06/2021 1512 by Idolina Primer, RN Outcome: Progressing 06/06/2021 1446 by Idolina Primer, RN Outcome: Progressing Goal: Will not experience complications related to urinary retention 06/06/2021 1512 by Idolina Primer, RN Outcome: Progressing 06/06/2021 1446 by Idolina Primer, RN Outcome: Progressing   Problem: Pain Managment: Goal: General experience of comfort will improve 06/06/2021 1512 by Idolina Primer, RN Outcome: Progressing 06/06/2021 1446 by Idolina Primer, RN Outcome: Progressing   Problem: Safety: Goal: Ability to remain free from injury will improve 06/06/2021 1512 by Idolina Primer, RN Outcome: Progressing 06/06/2021 1446 by Idolina Primer, RN Outcome: Progressing   Problem: Skin Integrity: Goal: Risk for impaired skin integrity will decrease 06/06/2021 1512 by Idolina Primer, RN Outcome: Progressing 06/06/2021 1446 by Idolina Primer, RN Outcome: Progressing   Problem: Education: Goal: Knowledge of the prescribed therapeutic regimen will improve 06/06/2021 1512 by Idolina Primer, RN Outcome: Progressing 06/06/2021 1446 by Idolina Primer, RN Outcome: Progressing   Problem: Clinical Measurements: Goal: Usual level of consciousness will be regained or maintained. 06/06/2021 1512 by Idolina Primer, RN Outcome: Progressing 06/06/2021 1446 by Idolina Primer, RN Outcome: Progressing Goal: Neurologic status will improve 06/06/2021 1512 by Idolina Primer, RN Outcome: Progressing 06/06/2021 1446 by Idolina Primer, RN Outcome:  Progressing Goal: Ability to maintain intracranial pressure will improve 06/06/2021 1512 by Idolina Primer, RN Outcome: Progressing 06/06/2021 1446 by Elmyra Ricks,  Timothy Lasso, RN Outcome: Progressing   Problem: Skin Integrity: Goal: Demonstration of wound healing without infection will improve 06/06/2021 1512 by Idolina Primer, RN Outcome: Progressing 06/06/2021 1446 by Idolina Primer, RN Outcome: Progressing

## 2021-06-06 NOTE — Progress Notes (Signed)
She looks good today.  Dressing is dry.  She is moving all extremities.  She is eating breakfast.  She is conversant.  Will transfer to stepdown today.  Await pathology.

## 2021-06-06 NOTE — Anesthesia Postprocedure Evaluation (Signed)
Anesthesia Post Note  Patient: Barbara Gibson  Procedure(s) Performed: RIGHT FRONTAL CRANIOTOMY FOR RESECTION OF TUMOR (Right: Head) APPLICATION OF CRANIAL NAVIGATION     Patient location during evaluation: PACU Anesthesia Type: General Level of consciousness: awake and alert Pain management: pain level controlled Vital Signs Assessment: post-procedure vital signs reviewed and stable Respiratory status: spontaneous breathing, nonlabored ventilation, respiratory function stable and patient connected to nasal cannula oxygen Cardiovascular status: blood pressure returned to baseline and stable Postop Assessment: no apparent nausea or vomiting Anesthetic complications: no   No notable events documented.  Last Vitals:  Vitals:   06/06/21 1300 06/06/21 1400  BP: (!) 119/50 (!) 157/56  Pulse: 60 70  Resp: 17 16  Temp:    SpO2: 99% 100%    Last Pain:  Vitals:   06/06/21 1200  TempSrc: Oral  PainSc: Asleep                 Dorrance Sellick

## 2021-06-06 NOTE — Plan of Care (Deleted)
Patient alert and oriented x4, strength equal in all extremities, pupils are equal and reactive. BP parameters maintained today. No bowel movement. Adequate UOP. Bath given and patient tolerated well. Skin assessed and no new injuries found. Family at bedside and updated on plan of care. Vital signs stable. Will continue to monitor.

## 2021-06-07 ENCOUNTER — Encounter (HOSPITAL_COMMUNITY): Payer: Self-pay | Admitting: Neurological Surgery

## 2021-06-07 MED ORDER — DEXAMETHASONE SODIUM PHOSPHATE 4 MG/ML IJ SOLN
4.0000 mg | Freq: Three times a day (TID) | INTRAMUSCULAR | Status: DC
  Administered 2021-06-07 – 2021-06-08 (×3): 4 mg via INTRAVENOUS
  Filled 2021-06-07 (×3): qty 1

## 2021-06-07 MED FILL — Thrombin For Soln 20000 Unit: CUTANEOUS | Qty: 1 | Status: AC

## 2021-06-07 NOTE — Progress Notes (Signed)
Nutrition Follow-up  DOCUMENTATION CODES:   Non-severe (moderate) malnutrition in context of chronic illness  INTERVENTION:   - Continue Ensure Enlive po TID, each supplement provides 350 kcal and 20 grams of protein  - Pt can substitute with Boost High Protein from home, each supplement provides 250 kcal and 20 grams of protein  - Continue MVI with minerals daily  - Encourage PO intake  NUTRITION DIAGNOSIS:   Moderate Malnutrition related to chronic illness as evidenced by mild muscle depletion, mild fat depletion, moderate muscle depletion.  Ongoing, being addressed via oral nutrition supplements  GOAL:   Patient will meet greater than or equal to 90% of their needs  Progressing  MONITOR:   PO intake, Supplement acceptance, Labs, Weight trends, I & O's, Skin  REASON FOR ASSESSMENT:   Consult Assessment of nutrition requirement/status  ASSESSMENT:   Pt with PMH significant for anxiety, COPD, depression, HLD, CAD, h/o MI, recent UTI, h/o breast Ca s/p lumpectomy admitted with brain mass with edema/encephalopathy, likely meningioma per neurosurgery.  7/08 - s/p R frontal craniotomy resection of tumor, clear liquids 7/09 - regular diet  Spoke with RN prior to entering pt's room. RN reports that pt ate fairly well at breakfast (consumed ~50% of breakfast potatoes but did not eat her cereal with milk).  Spoke with pt at bedside. Pt awake and alert and in good spirits. Pt drinking a Boost High Protein that her family has brought in from home. Pt states that she prefers these over the Ensure supplements here at the hospital. Pt states that she has been able to consume 2 of these supplements daily.  Pt feels like her appetite is slowly improving. Pt states that she ate well at dinner last night which included macaroni and cheese with chicken salad. Pt snacking on items like pretzels and chips in between meals and feels like she is snacking a lot. RD discussed with pt the  importance of increasing kcal and protein intake to meet needs and promote healing. Pt expresses understanding.  Admit weight: 74.9 kg Current weight: 68.9 kg  Meal Completion: 25-50%  Medications reviewed and include: IV decadron, colace, Ensure Enlive TID, MVI with minerals daily, protonix  Labs reviewed: BUN 28  Diet Order:   Diet Order             Diet regular Room service appropriate? Yes with Assist; Fluid consistency: Thin  Diet effective now                   EDUCATION NEEDS:   No education needs have been identified at this time  Skin:  Skin Assessment: Skin Integrity Issues: Stage I: sacrum Incisions: head  Last BM:  06/03/21  Height:   Ht Readings from Last 1 Encounters:  06/04/21 5\' 6"  (1.676 m)    Weight:   Wt Readings from Last 1 Encounters:  06/06/21 68.9 kg    BMI:  Body mass index is 24.52 kg/m.  Estimated Nutritional Needs:   Kcal:  1700-1900  Protein:  85-95g  Fluid:  >1.7L    Gustavus Bryant, MS, RD, LDN Inpatient Clinical Dietitian Please see [facility] for contact information.

## 2021-06-07 NOTE — Progress Notes (Signed)
Subjective: Patient reports intermittent mild headaches. Otherwise she has no complaints. No acute events overnight.   Objective: Vital signs in last 24 hours: Temp:  [97.2 F (36.2 C)-98.9 F (37.2 C)] 97.2 F (36.2 C) (07/11 0500) Pulse Rate:  [60-116] 72 (07/11 0500) Resp:  [16-22] 16 (07/11 0500) BP: (110-157)/(47-107) 147/60 (07/11 0500) SpO2:  [88 %-100 %] 100 % (07/11 0500)  Intake/Output from previous day: 07/10 0701 - 07/11 0700 In: 250 [P.O.:250] Out: 450 [Urine:450] Intake/Output this shift: No intake/output data recorded.  Physical Exam: Patient is awake, A/O X 4, conversant, and in good spirits. Speech is fluent and appropriate. Doing well. MAEW with good strength that is symmetric bilaterally. 5/5 BUE/BLE. PERRLA, EOMI. CNs grossly intact  Dressing is intact with a small amount of old drainage that is marked.    Lab Results: Recent Labs    06/04/21 1046 06/05/21 0500  WBC  --  13.2*  HGB 9.5* 10.8*  HCT 28.0* 35.3*  PLT  --  181   BMET Recent Labs    06/04/21 1046 06/05/21 0500  NA 131* 139  K 4.0 4.7  CL  --  95*  CO2  --  39*  GLUCOSE  --  136*  BUN  --  28*  CREATININE  --  0.77  CALCIUM  --  9.2    Studies/Results: No results found.  Assessment/Plan: 75 y.o. female with right frontal metastatic lesion who is s/p right frontal craniotomy resection of tumor on 06/04/2021.  -PT/OT -SBP less than 150 -Keppra -Wean Decadron   LOS: 12 days     Marvis Moeller, DNP, NP-C 06/07/2021, 9:08 AM

## 2021-06-08 ENCOUNTER — Encounter (HOSPITAL_COMMUNITY): Payer: Self-pay | Admitting: Neurological Surgery

## 2021-06-08 ENCOUNTER — Other Ambulatory Visit: Payer: Self-pay | Admitting: Radiation Therapy

## 2021-06-08 DIAGNOSIS — J431 Panlobular emphysema: Secondary | ICD-10-CM | POA: Diagnosis not present

## 2021-06-08 DIAGNOSIS — R531 Weakness: Secondary | ICD-10-CM | POA: Diagnosis not present

## 2021-06-08 DIAGNOSIS — G9340 Encephalopathy, unspecified: Secondary | ICD-10-CM | POA: Diagnosis not present

## 2021-06-08 DIAGNOSIS — G9389 Other specified disorders of brain: Secondary | ICD-10-CM | POA: Diagnosis not present

## 2021-06-08 LAB — SURGICAL PATHOLOGY

## 2021-06-08 LAB — SARS CORONAVIRUS 2 (TAT 6-24 HRS): SARS Coronavirus 2: NEGATIVE

## 2021-06-08 MED ORDER — DEXAMETHASONE SODIUM PHOSPHATE 4 MG/ML IJ SOLN
2.0000 mg | Freq: Three times a day (TID) | INTRAMUSCULAR | Status: DC
  Administered 2021-06-08 – 2021-06-09 (×4): 2 mg via INTRAVENOUS
  Filled 2021-06-08 (×4): qty 1

## 2021-06-08 MED ORDER — HEMOSTATIC AGENTS (NO CHARGE) OPTIME
TOPICAL | Status: DC | PRN
  Administered 2021-06-08: 1 via TOPICAL

## 2021-06-08 NOTE — Progress Notes (Addendum)
HEMATOLOGY-ONCOLOGY PROGRESS NOTE  SUBJECTIVE: Barbara Gibson is lying in bed this afternoon.  She states that she has a headache.  Thinks that she sat up for too long today.  She has no other complaints.  No family members at the bedside.  PHYSICAL EXAMINATION: ECOG PERFORMANCE STATUS: 2 - Symptomatic, <50% confined to bed  Vitals:   06/07/21 2325 06/08/21 0300  BP: (!) 116/58 (!) 115/54  Pulse: 62 67  Resp: 17 19  Temp: 98 F (36.7 C) 97.6 F (36.4 C)  SpO2: 96% 97%   Filed Weights   06/04/21 0500 06/04/21 1330 06/06/21 0500  Weight: 71.2 kg 73 kg 68.9 kg    Intake/Output from previous day: 07/11 0701 - 07/12 0700 In: 720 [P.O.:720] Out: 1150 [Urine:1150]  GENERAL: Resting in bed quietly, no distress SKIN: Dressing intact with small amount of old drainage noted EYES: normal, Conjunctiva are pink and non-injected, sclera clear LYMPH:  no palpable lymphadenopathy in the cervical, axillary or inguinal LUNGS: clear to auscultation and percussion with normal breathing effort HEART: regular rate & rhythm and no murmurs and no lower extremity edema ABDOMEN:abdomen soft, non-tender and normal bowel sounds  NEURO: alert & oriented x 3 with fluent speech, no focal motor/sensory deficits  LABORATORY DATA:  I have reviewed the data as listed CMP Latest Ref Rng & Units 06/05/2021 06/04/2021 06/04/2021  Glucose 70 - 99 mg/dL 136(H) - -  BUN 8 - 23 mg/dL 28(H) - -  Creatinine 0.44 - 1.00 mg/dL 0.77 - -  Sodium 135 - 145 mmol/L 139 131(L) 137  Potassium 3.5 - 5.1 mmol/L 4.7 4.0 4.2  Chloride 98 - 111 mmol/L 95(L) - -  CO2 22 - 32 mmol/L 39(H) - -  Calcium 8.9 - 10.3 mg/dL 9.2 - -  Total Protein 6.5 - 8.1 g/dL - - -  Total Bilirubin 0.3 - 1.2 mg/dL - - -  Alkaline Phos 38 - 126 U/L - - -  AST 15 - 41 U/L - - -  ALT 0 - 44 U/L - - -    Lab Results  Component Value Date   WBC 13.2 (H) 06/05/2021   HGB 10.8 (L) 06/05/2021   HCT 35.3 (L) 06/05/2021   MCV 96.4 06/05/2021   PLT 181  06/05/2021   NEUTROABS 7.4 05/25/2021    DG Chest 2 View  Result Date: 05/25/2021 CLINICAL DATA:  Weakness. EXAM: CHEST - 2 VIEW COMPARISON:  Chest x-ray report 03/05/2016. FINDINGS: Mediastinum and hilar structures normal. Heart size normal. Low lung volumes with mild bibasilar atelectasis. Mild left base infiltrate cannot be excluded. No pleural effusion or pneumothorax. IMPRESSION: Low lung volumes with mild bibasilar atelectasis. Mild left base infiltrate cannot be excluded. Electronically Signed   By: Marcello Moores  Register   On: 05/25/2021 06:49   MR BRAIN WO CONTRAST  Result Date: 05/25/2021 CLINICAL DATA:  Delirium, prior history of DCIS left breast EXAM: MRI HEAD WITHOUT CONTRAST TECHNIQUE: Multiplanar, multiecho pulse sequences of the brain and surrounding structures were obtained without intravenous contrast. COMPARISON:  None. FINDINGS: Brain: There is a 2.7 x 2.7 cm rounded mass of the anteroinferior right frontal lobe with extensive surrounding edema. There is regional mass effect including leftward midline shift with subfalcine herniation measuring 1 cm. No significant central herniation. No evidence of acute infarction or hemorrhage. Additional patchy T2 hyperintensity in the supratentorial and pontine white matter is nonspecific but probably reflects chronic microvascular ischemic changes. There is no extra-axial collection. There is no hydrocephalus. Vascular: Major  vessel flow voids at the skull base are preserved. Skull and upper cervical spine: Normal marrow signal is preserved. Sinuses/Orbits: Minor mucosal thickening.  Orbits are unremarkable. Other: Sella is unremarkable.  Mastoid air cells are clear. IMPRESSION: 2.7 cm anterior inferior right frontal lobe mass with extensive surrounding edema. Metastasis is the primary differential consideration. There is 1 cm of leftward midline shift. No hydrocephalus. Postcontrast imaging is recommended to evaluate for additional lesions. These  results will be called to the ordering clinician or representative by the Radiologist Assistant, and communication documented in the PACS or [employer]. Electronically Signed   By: Macy Mis M.D.   On: 05/25/2021 12:27   MR BRAIN W WO CONTRAST  Result Date: 06/05/2021 CLINICAL DATA:  Postop craniotomy EXAM: MRI HEAD WITHOUT AND WITH CONTRAST TECHNIQUE: Multiplanar, multiecho pulse sequences of the brain and surrounding structures were obtained without and with intravenous contrast. CONTRAST:  7.58mL GADAVIST GADOBUTROL 1 MMOL/ML IV SOLN COMPARISON:  Brain MRI 05/25/2021 FINDINGS: Brain: Right frontal mass resection with blood products in the cavity and adjacent extra-axial space. Wispy peripheral enhancement at the cavity, likely postoperative. Extensive but already diminished vasogenic edema. The anterior midline is displaced to the left by 6 mm. No hydrocephalus. No complicating infarct. No newly seen metastatic lesions. Vascular: Preserved flow voids and vascular enhancements. Skull and upper cervical spine: Unremarkable craniotomy Sinuses/Orbits: Negative IMPRESSION: Right frontal mass resection without adverse feature. Resection cavity enhancement is wispy and likely postoperative. Electronically Signed   By: Monte Fantasia M.D.   On: 06/05/2021 05:46   MR BRAIN W WO CONTRAST  Result Date: 05/26/2021 CLINICAL DATA:  Intracranial metastatic disease. Generalized weakness. EXAM: MRI HEAD WITHOUT AND WITH CONTRAST TECHNIQUE: Multiplanar, multiecho pulse sequences of the brain and surrounding structures were obtained without and with intravenous contrast. CONTRAST:  7.36mL GADAVIST GADOBUTROL 1 MMOL/ML IV SOLN COMPARISON:  Brain MRI 05/25/2021 at 11:06 a.m. FINDINGS: Brain: Unchanged appearance of right frontal lesion with large amount of surrounding edema. Unchanged leftward midline shift of 8 mm. Right frontal mass shows heterogeneous contrast enhancement and measures 3.2 x 2.6 cm. There are no  other contrast-enhancing lesions. There is no acute or chronic hemorrhage. Normal white matter signal, parenchymal volume and CSF spaces. The midline structures are normal. Vascular: Major flow voids are preserved. Skull and upper cervical spine: Normal calvarium and skull base. Visualized upper cervical spine and soft tissues are normal. Sinuses/Orbits:No paranasal sinus fluid levels or advanced mucosal thickening. No mastoid or middle ear effusion. Normal orbits. IMPRESSION: 1. Right frontal mass measuring 3.2 x 2.6 cm, with large amount of surrounding edema and 8 mm leftward midline shift. 2. No other metastatic lesions. Electronically Signed   By: Barbara Gibson M.D.   On: 05/26/2021 00:04   DG Chest Port 1 View  Result Date: 05/28/2021 CLINICAL DATA:  Shortness of breath. EXAM: PORTABLE CHEST 1 VIEW COMPARISON:  Chest x-ray dated May 25, 2021. FINDINGS: Normal heart size. Unchanged upper mediastinal contour abnormality related to known multinodular thyroid goiter. Chronically coarsened interstitial markings with emphysematous changes. No focal consolidation, pleural effusion, or pneumothorax. No acute osseous abnormality. Old right-sided rib fractures again noted. IMPRESSION: 1. No active disease. 2. COPD. Electronically Signed   By: Titus Dubin M.D.   On: 05/28/2021 12:47   DG Chest Port 1V same Day  Result Date: 06/04/2021 CLINICAL DATA:  Central line placement. EXAM: PORTABLE CHEST 1 VIEW COMPARISON:  05/28/2021 FINDINGS: A new left jugular catheter has been placed and terminates over  the upper SVC. The cardiac silhouette is normal in size. Widening of the upper mediastinum is unchanged and corresponds to known thyroid goiter. There is chronic coarsening of the interstitial markings with underlying emphysema. No confluent airspace opacity, pulmonary edema, sizable pleural effusion, or pneumothorax is identified. Aortic atherosclerosis an old right-sided rib fractures are noted. IMPRESSION: Interval  left jugular catheter placement as above. Electronically Signed   By: Logan Bores M.D.   On: 06/04/2021 13:02   EEG adult  Result Date: 05/26/2021 Lora Havens, MD     05/26/2021 10:06 AM Patient Name: Barbara Gibson MRN: [id number] Epilepsy Attending: Lora Havens Referring Physician/Provider: Dr Lesleigh Noe Date: 05/26/2021 Duration: 23.51 mins Patient history: 75 year old female with history of breast cancer now with brain mets presented with weakness.  Patient's daughter also reported episodes of left side shaking followed by weakness on the left side.  EEG to evaluate for seizures. Level of alertness: Awake AEDs during EEG study: Keppra Technical aspects: This EEG study was done with scalp electrodes positioned according to the 10-20 International system of electrode placement. Electrical activity was acquired at a sampling rate of 500Hz  and reviewed with a high frequency filter of 70Hz  and a low frequency filter of 1Hz . EEG data were recorded continuously and digitally stored. Description: The posterior dominant rhythm consists of 8-9 Hz activity of moderate voltage (25-35 uV) seen predominantly in posterior head regions, symmetric and reactive to eye opening and eye closing. EEG showed continuous rhythmic 2 to 3 Hz sharply contoured delta slowing in her right frontal region. Hyperventilation and photic stimulation were not performed.   ABNORMALITY -Continuous rhythmic delta slow, right frontal region IMPRESSION: This study is suggestive of cortical dysfunction in the right frontal region likely secondary to underlying mass.  Additionally, rhythmic delta slowing in frontal region is on the ictal-interictal continuum with low potential for seizures.  No seizures or definite epileptiform discharges were seen throughout the recording. Barbara Gibson   CT CHEST ABDOMEN PELVIS WO CONTRAST  Result Date: 05/25/2021 CLINICAL DATA:  75 year old female with evaluation for metastatic disease. EXAM: CT  CHEST, ABDOMEN AND PELVIS WITHOUT CONTRAST TECHNIQUE: Multidetector CT imaging of the chest, abdomen and pelvis was performed following the standard protocol without IV contrast. COMPARISON:  None. FINDINGS: Evaluation of this exam is limited in the absence of intravenous contrast. CT CHEST FINDINGS Cardiovascular: There is no cardiomegaly or pericardial effusion. Three-vessel coronary vascular calcification. Advanced atherosclerotic calcification of the thoracic aorta. No aneurysmal dilatation. The central pulmonary arteries are grossly unremarkable. Mediastinum/Nodes: No hilar or mediastinal adenopathy. The esophagus is grossly unremarkable. Enlarged thyroid gland with multiple nodules, likely multinodular goiter. No mediastinal fluid collection. Lungs/Pleura: Background of emphysema. There is a 1.7 x 1.0 cm left upper lobe subpleural nodule with irregular margins suspicious for a neoplastic process (32/7). Correlation with history of known malignancy and/or multidisciplinary consult and further evaluation with PET-CT is recommended. There is a 7 mm nodule with irregular margins in the left upper lobe (21/7). Apparent small nodular densities in the right upper lobe likely confluence of interstitial markings. There is however a 3 mm right upper lobe nodule (30/7). No lobar consolidation, pleural effusion, or pneumothorax. The central airways are patent. Musculoskeletal: Old healed right posterior rib fractures. No acute osseous pathology. Osteopenia with degenerative changes of the spine. CT ABDOMEN PELVIS FINDINGS No intra-abdominal free air or free fluid. Hepatobiliary: The liver is unremarkable. No intrahepatic biliary dilatation. The gallbladder is unremarkable. Pancreas: Unremarkable. No pancreatic ductal dilatation or  surrounding inflammatory changes. Spleen: Normal in size without focal abnormality. Adrenals/Urinary Tract: The adrenal glands are unremarkable. There is a 2 mm nonobstructing right renal upper  pole calculus. No hydronephrosis. The left kidney is unremarkable. The visualized ureters and urinary bladder are unremarkable. Stomach/Bowel: There is severe sigmoid diverticulosis without active inflammatory changes. There is no bowel obstruction or active inflammation. The appendix is normal. Vascular/Lymphatic: Advanced aortoiliac atherosclerotic disease. The IVC is unremarkable. No portal venous gas. There is no adenopathy. Reproductive: The uterus and ovaries are grossly unremarkable. No adnexal masses. Other: None Musculoskeletal: Osteopenia with degenerative changes of the spine. No acute osseous pathology. IMPRESSION: 1. Left upper lobe 1.7 x 1.0 cm subpleural nodule suspicious for a neoplastic process. Additional smaller pulmonary nodules as above. Correlation with history of known malignancy or multidisciplinary consult and further evaluation with PET-CT is recommended. 2. No evidence of metastatic disease in the abdomen and pelvis. 3. Severe sigmoid diverticulosis. No bowel obstruction. Normal appendix. 4. Multinodular goiter. 5. Aortic Atherosclerosis (ICD10-I70.0) and Emphysema (ICD10-J43.9). Electronically Signed   By: Anner Crete M.D.   On: 05/25/2021 23:53   CT STEALTH HEAD W/O CONTRAST  Result Date: 06/03/2021 CLINICAL DATA:  Intracranial mass lesion for surgical planning. EXAM: CT HEAD WITHOUT CONTRAST TECHNIQUE: Contiguous axial images were obtained from the base of the skull through the vertex without intravenous contrast. COMPARISON:  MRI 05/25/2021 FINDINGS: Brain: Approximately 3 cm in diameter intra-axial mass of the inferior right frontal lobe is redemonstrated, with regional vasogenic edema and right-to-left shift of approximately 6 mm. No hemorrhagic component. No second lesion is seen. No hydrocephalus. Evidence of ischemic changes. Vascular: There is atherosclerotic calcification of the major vessels at the base of the brain. Skull: Negative Sinuses/Orbits: Clear/normal Other:  None IMPRESSION: 3 cm intra-axial mass in the inferior right frontal lobe with regional vasogenic edema and right-to-left shift of 6 mm. Scan done for surgical planning/guidance. Electronically Signed   By: Barbara Gibson M.D.   On: 06/03/2021 13:30    ASSESSMENT AND PLAN: 1.  Left upper lobe lung lesion concerning for neoplastic process -CT chest/abdomen/pelvis without contrast 05/25/2021-left upper lobe 1.7 x 1.0 cm subpleural nodule suspicious for neoplastic process 2.  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 -Right frontal brain biopsy from 06/04/2021 -malignant neoplasm consistent with metastatic carcinoma, cytokeratin 7, TTF-1, and CDX2 (weak patchy) positive, consistent with metastatic adenocarcinoma the lung 3.  Focal seizures 4.  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  AKI 6.  COPD with chronic hypoxic respiratory failure 7.  CAD 8.  Hyperlipidemia 9.  History of MI 10.  Depression 11.  Anxiety 12.  History of tobacco dependence 13.  Normocytic anemia 14.  Candida in skin folds  Ms. Quang appears unchanged.  She is recovering from surgery.  Surgical path results discussed with the patient today which are consistent with metastatic carcinoma.  Immunohistochemical staining is currently pending.  I have attempted to call pathology to obtain an update, but cannot get through to their telephone on several occasions.  Metastatic lesions to the brain are suspected due to underlying lung malignancy.  Less likely due to her prior history of breast cancer.  The  patient will need radiation when she has healed from  surgery.  Radiation oncology consultation has been placed.  She will be considered for systemic therapy as an outpatient once the final pathology has resulted.  Recommendations: 1.  We will follow-up on the final pathology. 2.  Radiation oncology consultation has been placed. 3.  Wean Decadron per neurosurgery  We will arrange for outpatient follow-up at the cancer center once she is discharged from the hospital.   LOS: 13 days   Mikey Bussing, DNP, AGPCNP-BC, AOCNP 06/08/21 I saw Ms. Heinemann at approximately 6:30 AM.  She appeared stable.  I discussed the initial pathology report with her.  This confirmed metastatic carcinoma involving the frontal brain mass.  Additional immunohistochemical stains have returned consistent with adenocarcinoma of lung origin.  I will relay the pathology results to Ms. Madrid and her family in the morning.  We will consult radiation oncology for postresection radiation.  The pathology we submitted for molecular testing.  We will arrange for outpatient follow-up to discuss systemic treatment options.  Ms. Hascall indicated she would like to be followed in [facility] for oncology care.  I was present for greater than 50% of today's visit.  I performed medical decision making.  Julieanne Manson, MD  I was present for greater than 50% of today's visit.  I performed medical decision making.

## 2021-06-08 NOTE — Progress Notes (Signed)
PROGRESS NOTE    Barbara Gibson   ZTI:[id number]  DOB: 1946-08-07  DOA: 05/25/2021 PCP: [name], MD   Brief Narrative:  Barbara Gibson is a 75 year old female with anxiety/depression, COPD, CAD, former smoker, breast cancer status postlumpectomy without radiation or chemo.  She presented to the hospital for generalized weakness for 3 days.  Her work-up revealed a brain mass and neurosurgery was consulted.  Recommended to hold Plavix for 7 days and then proceed with resection on Friday, 06/04/2021.  Decadron started.  Transferred to ICU and NS service on 7/8 after surgery. Has been transferred back to [facility] service today.   Subjective: Has frontal headache different from her previuos headache prior to surgery.    Assessment & Plan:   Principal Problem: Right frontal brain mass, left upper lobe lung nodule 1.7 x 1 cm -History of breast cancer that is post left lumpectomy in 02/2016 followed by an aromatase inhibitor x5 years  -Lung nodule suspicious for neoplastic process -  Had to wait for biopsy on 7/8 due to needing to hold Plavix for 7 days - Evaluated by oncology on 6/30, 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 on 4 L o2 -On Anoro Ellipta as outpatient  AKI - Creatinine 1.76 on 6/28-this has improved to 0.64 after IV fluids    Coronary artery disease involving native coronary artery of native heart without angina pectoris -Continue statin and metoprolol - Holding Plavix  Anxiety/depression Continue citalopram and twice daily as needed Xanax  Intertrigo - Continue nystatin  Essential hypertension - Continue amlodipine at 5 mg daily and metoprolol at 12.5 mg twice daily  Insomnia - Using trazodone as needed  Also receiving Diamox   Time spent in minutes: 35 DVT prophylaxis: heparin injection 5,000 Units Start: 06/05/21 1200 SCDs Start: 06/04/21 1327 Place TED hose Start: 06/04/21 1327  Code Status: full code Family Communication:  Level of Care: Level of care: Med-Surg Disposition Plan:  Status is: Inpatient  Remains inpatient appropriate because:Inpatient level of care appropriate due to severity of illness  Dispo: The patient is from: Home              Anticipated d/c is to: SNF              Patient currently is not medically stable to d/c.   Difficult to place patient No  Consultants:  Neurology NS Oncology Procedures:  EEG Antimicrobials:  Anti-infectives (From admission, onward)    Start     Dose/Rate Route Frequency Ordered Stop   06/04/21 1415  ceFAZolin (ANCEF) IVPB 1 g/50 mL premix        1 g 100 mL/hr over 30 Minutes Intravenous Every  8 hours 06/04/21 1327 06/04/21 2150   06/04/21 0645  ceFAZolin (ANCEF) IVPB 2g/100 mL premix  Status:  Discontinued        2 g 200 mL/hr over 30 Minutes Intravenous 30 min pre-op 06/03/21 1221 06/04/21 1312   05/25/21 1215  cefTRIAXone (ROCEPHIN) 1 g in sodium chloride 0.9 % 100 mL IVPB  Status:  Discontinued        1 g 200 mL/hr over 30 Minutes Intravenous Every 24 hours 05/25/21 1120 05/28/21 1412         Objective: Vitals:   06/07/21 2325 06/08/21 0300 06/08/21 0850 06/08/21 1300  BP: (!) 116/58 (!) 115/54 (!) 132/58 (!) 152/84  Pulse: 62 67 73   Resp: 17 19 20    Temp: 98 F (36.7 C) 97.6 F (36.4 C) 97.8 F (36.6 C) 98 F (36.7 C)  TempSrc: Axillary Axillary Oral Oral  SpO2: 96% 97% 98%   Weight:      Height:        Intake/Output Summary (Last 24 hours) at 06/08/2021 1705 Last data filed at 06/08/2021 1312 Gross per 24 hour  Intake 840 ml  Output 850 ml  Net -10 ml    Filed Weights   06/04/21 0500 06/04/21 1330 06/06/21 0500  Weight: 71.2 kg 73 kg 68.9 kg    Examination: General exam: Appears comfortable  HEENT: PERRLA, oral mucosa moist, no sclera icterus or thrush Respiratory system: Clear to auscultation. Respiratory effort normal. Cardiovascular system: S1 & S2 heard, regular rate and rhythm Gastrointestinal system: Abdomen soft, non-tender, nondistended. Normal bowel sounds   Central nervous system: Alert and oriented. No focal neurological deficits. Extremities: No cyanosis, clubbing or edema Skin: No rashes or ulcers Psychiatry:  Mood & affect appropriate.      Data Reviewed: I have personally reviewed following labs and imaging studies  CBC: Recent Labs  Lab 06/02/21 0326 06/04/21 0917 06/04/21 1046 06/05/21 0500  WBC 10.8*  --   --  13.2*  HGB 11.0* 10.5* 9.5* 10.8*  HCT 37.7 31.0* 28.0* 35.3*  MCV 96.7  --   --  96.4  PLT 201  --   --  710    Basic Metabolic Panel: Recent Labs  Lab 06/02/21 0326 06/02/21 1752 06/03/21 0151 06/04/21 0917 06/04/21 1046 06/05/21 0500  NA 141 139 140 137 131* 139  K 5.2* 4.8 5.5* 4.2 4.0 4.7  CL 82* 82* 85*  --   --  95*  CO2 >50* >50* >50*  --   --  39*  GLUCOSE 195* 161* 188*  --   --  136*  BUN 29* 27* 23  --   --  28*  CREATININE 0.64 0.71 0.66  --   --  0.77  CALCIUM 9.5 9.3 9.5  --   --  9.2    GFR: Estimated Creatinine Clearance: 56 mL/min (by C-G formula based on SCr of 0.77  mg/dL). Liver Function Tests: No results for input(s): AST, ALT, ALKPHOS, BILITOT, PROT, ALBUMIN in the last 168 hours. No results for input(s): LIPASE, AMYLASE in the last 168 hours. No results for input(s): AMMONIA in the last 168 hours. Coagulation Profile: No results for input(s): INR, PROTIME in the last 168 hours. Cardiac Enzymes: No results for input(s): CKTOTAL, CKMB, CKMBINDEX, TROPONINI in the last 168 hours. BNP (last 3 results) No results for input(s): PROBNP in the last 8760 hours. HbA1C: No results for input(s): HGBA1C in the last 72 hours. CBG: No results for input(s): GLUCAP in the  last 168 hours.  Lipid Profile: No results for input(s): CHOL, HDL, LDLCALC, TRIG, CHOLHDL, LDLDIRECT in the last 72 hours. Thyroid Function Tests: No results for input(s): TSH, T4TOTAL, FREET4, T3FREE, THYROIDAB in the last 72 hours. Anemia Panel: No results for input(s): VITAMINB12, FOLATE, FERRITIN, TIBC, IRON, RETICCTPCT in the last 72 hours. Urine analysis:    Component Value Date/Time   COLORURINE YELLOW 05/25/2021 0858   APPEARANCEUR HAZY (A) 05/25/2021 0858   LABSPEC 1.013 05/25/2021 0858   PHURINE 6.0 05/25/2021 0858   GLUCOSEU NEGATIVE 05/25/2021 0858   HGBUR SMALL (A) 05/25/2021 0858   BILIRUBINUR NEGATIVE 05/25/2021 0858   KETONESUR 20 (A) 05/25/2021 0858   PROTEINUR 30 (A) 05/25/2021 0858   NITRITE NEGATIVE 05/25/2021 0858   LEUKOCYTESUR LARGE (A) 05/25/2021 0858   Sepsis Labs: @LABRCNTIP (procalcitonin:4,lacticidven:4) ) Recent Results (from the past 240 hour(s))  Surgical PCR screen     Status: Abnormal   Collection Time: 06/03/21 10:08 PM   Specimen: Nasal Mucosa; Nasal Swab  Result Value Ref Range Status   MRSA, PCR NEGATIVE NEGATIVE Final   Staphylococcus aureus POSITIVE (A) NEGATIVE Final    Comment: (NOTE) The Xpert SA Assay (FDA approved for NASAL specimens in patients 66 years of age and older), is one component of a comprehensive surveillance program.  It is not intended to diagnose infection nor to guide or monitor treatment. Performed at [facility], [place] [address], [place], [place] [address]   SARS CORONAVIRUS 2 (TAT 6-24 HRS) Nasopharyngeal Nasopharyngeal Swab     Status: None   Collection Time: 06/07/21  6:20 PM   Specimen: Nasopharyngeal Swab  Result Value Ref Range Status   SARS Coronavirus 2 NEGATIVE NEGATIVE Final    Comment: (NOTE) SARS-CoV-2 target nucleic acids are NOT DETECTED.  The SARS-CoV-2 RNA is generally detectable in upper and lower respiratory specimens during the acute phase of infection. Negative results do not preclude SARS-CoV-2 infection, do not rule out co-infections with other pathogens, and should not be used as the sole basis for treatment or other patient management decisions. Negative results must be combined with clinical observations, patient history, and epidemiological information. The expected result is Negative.  Fact Sheet for Patients: SugarRoll.be  Fact Sheet for Healthcare Providers: https://www.woods-mathews.com/  This test is not yet approved or cleared by the [place] FDA and  has been authorized for detection and/or diagnosis of SARS-CoV-2 by FDA under an Emergency Use Authorization (EUA). This EUA will remain  in effect (meaning this test can be used) for the duration of the COVID-19 declaration under Se ction 564(b)(1) of the Act, 21 U.S.C. section 360bbb-3(b)(1), unless the authorization is terminated or revoked sooner.  Performed at [facility], [place] [address], [place], [place] [address]          Radiology Studies: No results found.    Scheduled Meds:  acetaZOLAMIDE  250 mg Oral BID   amLODipine  5 mg Oral Daily   Chlorhexidine Gluconate Cloth  6 each Topical Daily   citalopram  20 mg Oral Daily   dexamethasone (DECADRON) injection  2 mg Intravenous Q8H   docusate sodium  100 mg Oral BID   feeding  supplement  237 mL Oral TID BM   heparin injection (subcutaneous)  5,000 Units Subcutaneous Q12H   levETIRAcetam  250 mg Oral BID   mouth rinse  15 mL Mouth Rinse BID   metoprolol tartrate  12.5 mg Oral BID   multivitamin with minerals  1 tablet Oral  Daily   nystatin  1 application Topical BID   pantoprazole  40 mg Oral Daily   simvastatin  20 mg Oral q1800   sodium chloride flush  3 mL Intravenous Q12H   umeclidinium-vilanterol  1 puff Inhalation Daily   Continuous Infusions:   LOS: 13 days      Debbe Odea, MD [facility] Pager: [email or web address] 06/08/2021, 5:05 PM

## 2021-06-08 NOTE — Progress Notes (Signed)
   Providing Compassionate, Quality Care - Together  NEUROSURGERY PROGRESS NOTE   S: No issues overnight.  Sitting up and eating, tolerating diet  O: EXAM:  BP (!) 152/84   Pulse 73   Temp 98 F (36.7 C) (Oral)   Resp 20   Ht 5\' 6"  (1.676 m)   Wt 68.9 kg   SpO2 98%   BMI 24.52 kg/m   Awake, alert, oriented x3 PERRLA EOMI Face symmetric Dressing clean dry and intact Speech fluent, appropriate  CNs grossly intact  5/5 BUE/BLE   ASSESSMENT:  75 y.o. female with   Right frontal metastatic lesion   -Status post right frontal craniotomy resection of tumor 06/04/2021   PLAN: -PT/OT -Keppra -Wean Decadron -dvt ppx   Thank you for allowing me to participate in this patient's care.  Please do not hesitate to call with questions or concerns.   Elwin Sleight, [facility] Cell: [phone]

## 2021-06-08 NOTE — Care Management Important Message (Signed)
Important Message  Patient Details  Name: Barbara Gibson MRN: [id number] Date of Birth: 03-11-1946   Medicare Important Message Given:  Yes     Sir Mallis [address] 06/08/2021, 2:58 PM

## 2021-06-08 NOTE — Progress Notes (Signed)
Physical Therapy Treatment Patient Details Name: Barbara Gibson MRN: [id number] DOB: 1946-08-21 Today's Date: 06/08/2021    History of Present Illness 75 y/o female presented to [facility] ED on 05/25/21 with generalized weakness x 10 days, difficulty with executive functioning, and headache. T/f to [facility]. 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 S/p R frontal crani with resection of tumor 7/8. PMH: COPD, anxiety, depression, HLD, MI in 2015    PT Comments    Pt fatigued this PM session having more difficulty getting up to her feet than earlier with OT.  She did sit EOB for ~20 mins participating in LE exercises and sitting tolerance (while talking on the phone with her son).  She continues to fatigue and list to the left in sitting.  She had been up this AM and PT woke her up for session, so we returned to bed after to rest.  Pt reports moving does increase her pulsing HA.  She reports she has had something recently for HA.  PT will continue to follow acutely for safe mobility progression    Follow Up Recommendations  SNF     Equipment Recommendations  Rolling walker with 5" wheels;3in1 (PT);Wheelchair (measurements PT);Wheelchair cushion (measurements PT)    Recommendations for Other Services       Precautions / Restrictions Precautions Precautions: Fall Precaution Comments: 4L O2 @ baseline Restrictions Weight Bearing Restrictions: No    Mobility  Bed Mobility Overal bed mobility: Needs Assistance Bed Mobility: Supine to Sit     Supine to sit: Min assist;HOB elevated Sit to supine: Mod assist   General bed mobility comments: Min hand held assist to come up to sitting EOB, mod assist to help scoot out until bil feet touched the ground.  Difficulty with processing, sequencing and motor planning to scoot.  Mod assist to lift both legs back into bed    Transfers Overall transfer level: Needs  assistance Equipment used: Rolling walker (2 wheeled) Transfers: Sit to/from [facility] Sit to Stand: Min assist Stand pivot transfers: Min assist       General transfer comment: Attempted standing EOB wiht RW and pt unable to get up on her feet, tried again without RW and therapist only able to get her to squat.  Pt may be fatigued after being up this AM, sitting on the side of the bed for 20 mins before attempting to stand and PT woke pt up from sleeping.  Ambulation/Gait                 Stairs             Wheelchair Mobility    Modified Rankin (Stroke Patients Only)       Balance Overall balance assessment: Needs assistance Sitting-balance support: Feet supported;Bilateral upper extremity supported;No upper extremity supported;Single extremity supported Sitting balance-Leahy Scale: Poor Sitting balance - Comments: kyphotic, eventually flexes forward and starts to lean to the right with prolonged sitting EOB.   Standing balance support: Bilateral upper extremity supported Standing balance-Leahy Scale: Zero Standing balance comment: unable to get pt standing this PM, likely due to fatigue.                            Cognition Arousal/Alertness: Awake/alert Behavior During Therapy: WFL for tasks assessed/performed Overall Cognitive Status: Impaired/Different from baseline Area of Impairment: Following commands;Awareness  Following Commands: Follows one step commands with increased time Safety/Judgement: Decreased awareness of safety Awareness: Emergent Problem Solving: Slow processing;Requires verbal cues;Difficulty sequencing General Comments: Pt A/O x4; cues to turn head in all directions to attend to all sides.      Exercises General Exercises - Upper Extremity Shoulder Flexion: AROM;Both;10 reps General Exercises - Lower Extremity Long Arc Quad: AROM;Both;10 reps Hip Flexion/Marching:  AROM;Both;10 reps Toe Raises: AROM;Both;20 reps    General Comments General comments (skin integrity, edema, etc.): 4L O2 [place], all believable sats were 90-100%.      Pertinent Vitals/Pain Pain Assessment: Faces Faces Pain Scale: Hurts even more Pain Location: headache Pain Descriptors / Indicators: Aching Pain Intervention(s): Limited activity within patient's tolerance;Monitored during session;Repositioned (per pt she has already had something for pain.)    Home Living Family/patient expects to be discharged to:: Private residence Living Arrangements: Children Available Help at Discharge: Family;Available 24 hours/day;Other (Comment) Type of Home: House Home Access: Stairs to enter Entrance Stairs-Rails: None Home Layout: One level Home Equipment: Bedside commode;Shower seat Additional Comments: Pt reporting she plans to live with her sister after the SNF.    Prior Function Level of Independence: Needs assistance  Gait / Transfers Assistance Needed: Walking without use of an AD per daughter ADL's / Homemaking Assistance Needed: Needing increasing assist over the last month per daughter for: bathing/dressing/home management, and bill payment. Comments: daughter reports fall x 1 in last 2-3 weeks   PT Goals (current goals can now be found in the care plan section) Acute Rehab PT Goals Patient Stated Goal: to go live with sister after SNF Progress towards PT goals: Progressing toward goals    Frequency    Min 2X/week      PT Plan Current plan remains appropriate    Co-evaluation   Reason for Co-Treatment: For patient/therapist safety;To address functional/ADL transfers   OT goals addressed during session: ADL's and self-care;Strengthening/ROM      AM-PAC PT "6 Clicks" Mobility   Outcome Measure  Help needed turning from your back to your side while in a flat bed without using bedrails?: A Little Help needed moving from lying on your back to sitting on the side of  a flat bed without using bedrails?: A Little Help needed moving to and from a bed to a chair (including a wheelchair)?: Total Help needed standing up from a chair using your arms (e.g., wheelchair or bedside chair)?: Total Help needed to walk in hospital room?: Total Help needed climbing 3-5 steps with a railing? : Total 6 Click Score: 10    End of Session Equipment Utilized During Treatment: Oxygen Activity Tolerance: Patient limited by fatigue Patient left: in bed;with call bell/phone within reach;with bed alarm set   PT Visit Diagnosis: Unsteadiness on feet (R26.81);Muscle weakness (generalized) (M62.81);History of falling (Z91.81);Difficulty in walking, not elsewhere classified (R26.2);Other abnormalities of gait and mobility (R26.89);Other symptoms and signs involving the nervous system (G18.299)     Time: 3716-9678 PT Time Calculation (min) (ACUTE ONLY): 25 min  Charges:  $Therapeutic Activity: 23-37 mins                     Verdene Lennert, PT, DPT  Acute Rehabilitation Ortho Tech Supervisor [phone] pager [phone]) [phone] office

## 2021-06-08 NOTE — Evaluation (Signed)
Occupational Therapy Re-Evaluation Patient Details Name: Barbara Gibson MRN: [id number] DOB: [birth year] Today's Date: 06/08/2021    History of Present Illness 75 y/o female presented to [facility] on 6/28 with generalized weakness x 10 days, difficulty with executive functioning, and headache. T/f to [facility]. 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 S/p R frontal crani with resection of tumor 7/8. PMH: COPD, anxiety, depression, HLD, MI in 2015   Clinical Impression   Pt PTA: Pt living with family. Pt re-eval s/p R frontal crani 7/8. Pt currently, limited by decreased strength, decreased cognition, decreased ability to care for self and decreased activity tolerance. Pt set-upA to maxA for ADL and minA overall for mobility taking a few steps to [facility] and recliner. Pt standing for pericare with minguardA x2 mins. Pt's O2 > 90% on 4L O2. Pt would benefit from continued OT skilled services for ADL, mobility, safety and activity tolerance. OT following acutely.     Follow Up Recommendations  SNF    Equipment Recommendations  Tub/shower seat    Recommendations for Other Services       Precautions / Restrictions Precautions Precautions: Fall Precaution Comments: 4L O2 @ baseline Restrictions Weight Bearing Restrictions: No      Mobility Bed Mobility Overal bed mobility: Needs Assistance Bed Mobility: Supine to Sit     Supine to sit: Min assist;HOB elevated     General bed mobility comments: Assist for placement of hands    Transfers Overall transfer level: Needs assistance Equipment used: Rolling walker (2 wheeled) Transfers: Sit to/from [facility] Sit to Stand: Min assist Stand pivot transfers: Min assist       General transfer comment: sit to stand from recliner, BSC and EOB with  minA overall. Pt using momentum to rock forward x3 times.    Balance Overall balance assessment: Needs  assistance Sitting-balance support: Feet supported Sitting balance-Leahy Scale: Fair Sitting balance - Comments: kyphotic   Standing balance support: Bilateral upper extremity supported Standing balance-Leahy Scale: Poor Standing balance comment: needing external assist                           ADL either performed or assessed with clinical judgement   ADL Overall ADL's : Needs assistance/impaired Eating/Feeding: Set up;Sitting   Grooming: Minimal assistance;Standing   Upper Body Bathing: Moderate assistance;Sitting   Lower Body Bathing: Maximal assistance;Cueing for safety;Sitting/lateral leans;Sit to/from stand   Upper Body Dressing : Moderate assistance   Lower Body Dressing: Maximal assistance;+2 for physical assistance;+2 for safety/equipment;Sitting/lateral leans;Sit to/from stand;Cueing for safety   Toilet Transfer: Minimal assistance;RW   Toileting- Clothing Manipulation and Hygiene: Maximal assistance;Sitting/lateral lean;Sit to/from stand Toileting - Clothing Manipulation Details (indicate cue type and reason): assist once in standing due to instability     Functional mobility during ADLs: Minimal assistance;Cueing for safety;Cueing for sequencing General ADL Comments: Pt limited by decreased strength, decreased cognition, decreased ability to care for self and decreased activity tolerance.     Vision Baseline Vision/History: Wears glasses (wears contacts) Patient Visual Report: No change from baseline Vision Assessment?: Yes Eye Alignment: Within Functional Limits Ocular Range of Motion: Within Functional Limits Alignment/Gaze Preference: Within Defined Limits     Perception     Praxis      Pertinent Vitals/Pain Pain Assessment: No/denies pain     Hand Dominance Right   Extremity/Trunk Assessment Upper Extremity Assessment  Upper Extremity Assessment: Generalized weakness;LUE deficits/detail;RUE deficits/detail;Defer to OT evaluation RUE  Deficits / Details: edema present + bruising RUE Coordination: decreased gross motor LUE Deficits / Details: edema present + bruising LUE Coordination: decreased gross motor   Lower Extremity Assessment Lower Extremity Assessment: Generalized weakness   Cervical / Trunk Assessment Cervical / Trunk Assessment: Kyphotic   Communication Communication Communication: No difficulties   Cognition Arousal/Alertness: Awake/alert Behavior During Therapy: Flat affect Overall Cognitive Status: Impaired/Different from baseline Area of Impairment: Following commands;Awareness                       Following Commands: Follows one step commands with increased time   Awareness: Emergent Problem Solving: Slow processing;Requires verbal cues;Difficulty sequencing General Comments: Pt A/O x4; cues to turn head in all directions to attend to all sides.   General Comments  4L O2 variable pleth at times appears >90% with exertion.    Exercises     Shoulder Instructions      Home Living Family/patient expects to be discharged to:: Private residence Living Arrangements: Children Available Help at Discharge: Family;Available 24 hours/day;Other (Comment) Type of Home: House Home Access: Stairs to enter [employer] of Steps: 2 Entrance Stairs-Rails: None Home Layout: One level     Bathroom Shower/Tub: Teacher, early years/pre: Standard Bathroom Accessibility: Yes   Home Equipment: Bedside commode;Shower seat   Additional Comments: Pt reporting she plans to live with her sister after the SNF.      Prior Functioning/Environment Level of Independence: Needs assistance  Gait / Transfers Assistance Needed: Walking without use of an AD per daughter ADL's / Homemaking Assistance Needed: Needing increasing assist over the last month per daughter for: bathing/dressing/home management, and bill payment.   Comments: daughter reports fall x 1 in last 2-3 weeks         OT Problem List: Decreased strength;Decreased activity tolerance;Impaired balance (sitting and/or standing);Decreased knowledge of use of DME or AE;Decreased safety awareness;Decreased cognition      OT Treatment/Interventions: Self-care/ADL training;Therapeutic exercise;DME and/or AE instruction;Balance training;Therapeutic activities;Cognitive remediation/compensation    OT Goals(Current goals can be found in the care plan section) Acute Rehab OT Goals Patient Stated Goal: to go live with sister after SNF OT Goal Formulation: With patient Time For Goal Achievement: 06/22/21 Potential to Achieve Goals: Good ADL Goals Pt Will Perform Grooming: with supervision;sitting Pt Will Perform Upper Body Bathing: with set-up;sitting Pt Will Perform Lower Body Bathing: with min guard assist;sit to/from stand Pt Will Perform Upper Body Dressing: with supervision;sitting Pt Will Perform Lower Body Dressing: with min guard assist;sit to/from stand;sitting/lateral leans Pt Will Transfer to Toilet: with min assist;ambulating Pt Will Perform Toileting - Clothing Manipulation and hygiene: with min guard assist;sit to/from stand  OT Frequency: Min 2X/week   Barriers to D/C:    none       Co-evaluation PT/OT/SLP Co-Evaluation/Treatment: Yes Reason for Co-Treatment: For patient/therapist safety;To address functional/ADL transfers   OT goals addressed during session: ADL's and self-care;Strengthening/ROM      AM-PAC OT "6 Clicks" Daily Activity     Outcome Measure Help from another person eating meals?: None Help from another person taking care of personal grooming?: A Little Help from another person toileting, which includes using toliet, bedpan, or urinal?: A Lot Help from another person bathing (including washing, rinsing, drying)?: A Lot Help from another person to put on and taking off regular upper body clothing?: A Little Help from another person to put on  and taking off regular lower  body clothing?: A Lot 6 Click Score: 16   End of Session Equipment Utilized During Treatment: Rolling walker;Gait belt;Oxygen Nurse Communication: Mobility status  Activity Tolerance: Patient limited by fatigue Patient left: in chair;with call bell/phone within reach;with chair alarm set  OT Visit Diagnosis: Unsteadiness on feet (R26.81);Muscle weakness (generalized) (M62.81);Other symptoms and signs involving cognitive function                Time: 1020-1048 OT Time Calculation (min): 28 min Charges:  OT General Charges $OT Visit: 1 Visit OT Evaluation $OT Re-eval: 1 Re-eval OT Treatments $Self Care/Home Management : 8-22 mins  Jefferey Pica, OTR/L Acute Rehabilitation Services Pager: [phone] Office: [phone]   Teddie Mehta C 06/08/2021, 3:31 PM

## 2021-06-09 DIAGNOSIS — G9341 Metabolic encephalopathy: Secondary | ICD-10-CM | POA: Diagnosis present

## 2021-06-09 DIAGNOSIS — R6510 Systemic inflammatory response syndrome (SIRS) of non-infectious origin without acute organ dysfunction: Secondary | ICD-10-CM | POA: Diagnosis present

## 2021-06-09 DIAGNOSIS — K219 Gastro-esophageal reflux disease without esophagitis: Secondary | ICD-10-CM | POA: Diagnosis present

## 2021-06-09 DIAGNOSIS — R531 Weakness: Secondary | ICD-10-CM | POA: Diagnosis not present

## 2021-06-09 DIAGNOSIS — D72829 Elevated white blood cell count, unspecified: Secondary | ICD-10-CM | POA: Diagnosis not present

## 2021-06-09 DIAGNOSIS — J9611 Chronic respiratory failure with hypoxia: Secondary | ICD-10-CM | POA: Diagnosis not present

## 2021-06-09 DIAGNOSIS — C7931 Secondary malignant neoplasm of brain: Secondary | ICD-10-CM | POA: Diagnosis present

## 2021-06-09 DIAGNOSIS — G936 Cerebral edema: Secondary | ICD-10-CM | POA: Diagnosis present

## 2021-06-09 DIAGNOSIS — I959 Hypotension, unspecified: Secondary | ICD-10-CM | POA: Diagnosis present

## 2021-06-09 DIAGNOSIS — M6281 Muscle weakness (generalized): Secondary | ICD-10-CM | POA: Diagnosis not present

## 2021-06-09 DIAGNOSIS — L304 Erythema intertrigo: Secondary | ICD-10-CM | POA: Diagnosis not present

## 2021-06-09 DIAGNOSIS — I2510 Atherosclerotic heart disease of native coronary artery without angina pectoris: Secondary | ICD-10-CM | POA: Diagnosis not present

## 2021-06-09 DIAGNOSIS — R519 Headache, unspecified: Secondary | ICD-10-CM | POA: Diagnosis not present

## 2021-06-09 DIAGNOSIS — R0602 Shortness of breath: Secondary | ICD-10-CM | POA: Diagnosis not present

## 2021-06-09 DIAGNOSIS — I129 Hypertensive chronic kidney disease with stage 1 through stage 4 chronic kidney disease, or unspecified chronic kidney disease: Secondary | ICD-10-CM | POA: Diagnosis present

## 2021-06-09 DIAGNOSIS — M8580 Other specified disorders of bone density and structure, unspecified site: Secondary | ICD-10-CM | POA: Diagnosis not present

## 2021-06-09 DIAGNOSIS — Z853 Personal history of malignant neoplasm of breast: Secondary | ICD-10-CM | POA: Diagnosis not present

## 2021-06-09 DIAGNOSIS — L8990 Pressure ulcer of unspecified site, unspecified stage: Secondary | ICD-10-CM | POA: Insufficient documentation

## 2021-06-09 DIAGNOSIS — E872 Acidosis: Secondary | ICD-10-CM | POA: Diagnosis present

## 2021-06-09 DIAGNOSIS — J9621 Acute and chronic respiratory failure with hypoxia: Secondary | ICD-10-CM | POA: Diagnosis present

## 2021-06-09 DIAGNOSIS — Z9981 Dependence on supplemental oxygen: Secondary | ICD-10-CM | POA: Diagnosis not present

## 2021-06-09 DIAGNOSIS — R4182 Altered mental status, unspecified: Secondary | ICD-10-CM | POA: Diagnosis not present

## 2021-06-09 DIAGNOSIS — G4700 Insomnia, unspecified: Secondary | ICD-10-CM | POA: Diagnosis not present

## 2021-06-09 DIAGNOSIS — Z87891 Personal history of nicotine dependence: Secondary | ICD-10-CM | POA: Diagnosis not present

## 2021-06-09 DIAGNOSIS — C3490 Malignant neoplasm of unspecified part of unspecified bronchus or lung: Secondary | ICD-10-CM | POA: Diagnosis present

## 2021-06-09 DIAGNOSIS — J431 Panlobular emphysema: Secondary | ICD-10-CM | POA: Diagnosis not present

## 2021-06-09 DIAGNOSIS — Z483 Aftercare following surgery for neoplasm: Secondary | ICD-10-CM | POA: Diagnosis not present

## 2021-06-09 DIAGNOSIS — K5909 Other constipation: Secondary | ICD-10-CM | POA: Diagnosis not present

## 2021-06-09 DIAGNOSIS — I10 Essential (primary) hypertension: Secondary | ICD-10-CM | POA: Diagnosis not present

## 2021-06-09 DIAGNOSIS — E860 Dehydration: Secondary | ICD-10-CM

## 2021-06-09 DIAGNOSIS — G9340 Encephalopathy, unspecified: Secondary | ICD-10-CM | POA: Diagnosis not present

## 2021-06-09 DIAGNOSIS — F419 Anxiety disorder, unspecified: Secondary | ICD-10-CM | POA: Diagnosis present

## 2021-06-09 DIAGNOSIS — N1832 Chronic kidney disease, stage 3b: Secondary | ICD-10-CM | POA: Diagnosis present

## 2021-06-09 DIAGNOSIS — R488 Other symbolic dysfunctions: Secondary | ICD-10-CM | POA: Diagnosis not present

## 2021-06-09 DIAGNOSIS — R634 Abnormal weight loss: Secondary | ICD-10-CM | POA: Diagnosis not present

## 2021-06-09 DIAGNOSIS — R911 Solitary pulmonary nodule: Secondary | ICD-10-CM | POA: Diagnosis not present

## 2021-06-09 DIAGNOSIS — F32A Depression, unspecified: Secondary | ICD-10-CM | POA: Diagnosis present

## 2021-06-09 DIAGNOSIS — R2681 Unsteadiness on feet: Secondary | ICD-10-CM | POA: Diagnosis not present

## 2021-06-09 DIAGNOSIS — M810 Age-related osteoporosis without current pathological fracture: Secondary | ICD-10-CM | POA: Diagnosis present

## 2021-06-09 DIAGNOSIS — Z66 Do not resuscitate: Secondary | ICD-10-CM | POA: Diagnosis present

## 2021-06-09 DIAGNOSIS — J9601 Acute respiratory failure with hypoxia: Secondary | ICD-10-CM | POA: Diagnosis not present

## 2021-06-09 DIAGNOSIS — J439 Emphysema, unspecified: Secondary | ICD-10-CM | POA: Diagnosis present

## 2021-06-09 DIAGNOSIS — Z20822 Contact with and (suspected) exposure to covid-19: Secondary | ICD-10-CM | POA: Diagnosis present

## 2021-06-09 DIAGNOSIS — Z22322 Carrier or suspected carrier of Methicillin resistant Staphylococcus aureus: Secondary | ICD-10-CM | POA: Diagnosis not present

## 2021-06-09 DIAGNOSIS — E440 Moderate protein-calorie malnutrition: Secondary | ICD-10-CM | POA: Diagnosis not present

## 2021-06-09 DIAGNOSIS — I700 Atherosclerosis of aorta: Secondary | ICD-10-CM | POA: Diagnosis not present

## 2021-06-09 DIAGNOSIS — E7800 Pure hypercholesterolemia, unspecified: Secondary | ICD-10-CM | POA: Diagnosis present

## 2021-06-09 DIAGNOSIS — G9389 Other specified disorders of brain: Secondary | ICD-10-CM | POA: Diagnosis not present

## 2021-06-09 DIAGNOSIS — N179 Acute kidney failure, unspecified: Secondary | ICD-10-CM | POA: Diagnosis present

## 2021-06-09 DIAGNOSIS — J9612 Chronic respiratory failure with hypercapnia: Secondary | ICD-10-CM | POA: Diagnosis not present

## 2021-06-09 DIAGNOSIS — E785 Hyperlipidemia, unspecified: Secondary | ICD-10-CM | POA: Diagnosis not present

## 2021-06-09 DIAGNOSIS — R262 Difficulty in walking, not elsewhere classified: Secondary | ICD-10-CM | POA: Diagnosis not present

## 2021-06-09 DIAGNOSIS — R569 Unspecified convulsions: Secondary | ICD-10-CM | POA: Diagnosis not present

## 2021-06-09 DIAGNOSIS — J9602 Acute respiratory failure with hypercapnia: Secondary | ICD-10-CM | POA: Diagnosis not present

## 2021-06-09 DIAGNOSIS — N390 Urinary tract infection, site not specified: Secondary | ICD-10-CM | POA: Diagnosis not present

## 2021-06-09 DIAGNOSIS — Z7401 Bed confinement status: Secondary | ICD-10-CM | POA: Diagnosis not present

## 2021-06-09 DIAGNOSIS — J9622 Acute and chronic respiratory failure with hypercapnia: Secondary | ICD-10-CM | POA: Diagnosis present

## 2021-06-09 DIAGNOSIS — R41841 Cognitive communication deficit: Secondary | ICD-10-CM | POA: Diagnosis not present

## 2021-06-09 DIAGNOSIS — I252 Old myocardial infarction: Secondary | ICD-10-CM | POA: Diagnosis not present

## 2021-06-09 DIAGNOSIS — J449 Chronic obstructive pulmonary disease, unspecified: Secondary | ICD-10-CM | POA: Diagnosis not present

## 2021-06-09 DIAGNOSIS — F411 Generalized anxiety disorder: Secondary | ICD-10-CM | POA: Diagnosis not present

## 2021-06-09 DIAGNOSIS — Z515 Encounter for palliative care: Secondary | ICD-10-CM | POA: Diagnosis not present

## 2021-06-09 LAB — CBC WITH DIFFERENTIAL/PLATELET
Abs Immature Granulocytes: 0.08 10*3/uL — ABNORMAL HIGH (ref 0.00–0.07)
Basophils Absolute: 0 10*3/uL (ref 0.0–0.1)
Basophils Relative: 0 %
Eosinophils Absolute: 0 10*3/uL (ref 0.0–0.5)
Eosinophils Relative: 0 %
HCT: 35.5 % — ABNORMAL LOW (ref 36.0–46.0)
Hemoglobin: 10.9 g/dL — ABNORMAL LOW (ref 12.0–15.0)
Immature Granulocytes: 1 %
Lymphocytes Relative: 5 %
Lymphs Abs: 0.5 10*3/uL — ABNORMAL LOW (ref 0.7–4.0)
MCH: 29.1 pg (ref 26.0–34.0)
MCHC: 30.7 g/dL (ref 30.0–36.0)
MCV: 94.9 fL (ref 80.0–100.0)
Monocytes Absolute: 0.8 10*3/uL (ref 0.1–1.0)
Monocytes Relative: 9 %
Neutro Abs: 7.7 10*3/uL (ref 1.7–7.7)
Neutrophils Relative %: 85 %
Platelets: 185 10*3/uL (ref 150–400)
RBC: 3.74 MIL/uL — ABNORMAL LOW (ref 3.87–5.11)
RDW: 12.3 % (ref 11.5–15.5)
WBC: 9.1 10*3/uL (ref 4.0–10.5)
nRBC: 0 % (ref 0.0–0.2)

## 2021-06-09 LAB — COMPREHENSIVE METABOLIC PANEL
ALT: 15 U/L (ref 0–44)
AST: 12 U/L — ABNORMAL LOW (ref 15–41)
Albumin: 2.9 g/dL — ABNORMAL LOW (ref 3.5–5.0)
Alkaline Phosphatase: 32 U/L — ABNORMAL LOW (ref 38–126)
Anion gap: 4 — ABNORMAL LOW (ref 5–15)
BUN: 29 mg/dL — ABNORMAL HIGH (ref 8–23)
CO2: 38 mmol/L — ABNORMAL HIGH (ref 22–32)
Calcium: 9.3 mg/dL (ref 8.9–10.3)
Chloride: 96 mmol/L — ABNORMAL LOW (ref 98–111)
Creatinine, Ser: 0.71 mg/dL (ref 0.44–1.00)
GFR, Estimated: >60 mL/min (ref >60)
Glucose, Bld: 277 mg/dL — ABNORMAL HIGH (ref 70–99)
Potassium: 4.2 mmol/L (ref 3.5–5.1)
Sodium: 138 mmol/L (ref 135–145)
Total Bilirubin: 0.3 mg/dL (ref 0.3–1.2)
Total Protein: 5.4 g/dL — ABNORMAL LOW (ref 6.5–8.1)

## 2021-06-09 LAB — PHOSPHORUS: Phosphorus: 2.7 mg/dL (ref 2.5–4.6)

## 2021-06-09 LAB — MAGNESIUM: Magnesium: 1.9 mg/dL (ref 1.7–2.4)

## 2021-06-09 MED ORDER — SENNOSIDES-DOCUSATE SODIUM 8.6-50 MG PO TABS
1.0000 | ORAL_TABLET | Freq: Two times a day (BID) | ORAL | Status: DC
  Administered 2021-06-09: 1 via ORAL
  Filled 2021-06-09 (×2): qty 1

## 2021-06-09 MED ORDER — DEXAMETHASONE 2 MG PO TABS: 2.0000 mg | ORAL_TABLET | Freq: Two times a day (BID) | ORAL | 0 refills | Status: AC

## 2021-06-09 MED ORDER — HYDROCODONE-ACETAMINOPHEN 5-325 MG PO TABS: 1.0000 | ORAL_TABLET | ORAL | 0 refills | Status: AC | PRN

## 2021-06-09 MED ORDER — PANTOPRAZOLE SODIUM 40 MG PO TBEC: 40.0000 mg | DELAYED_RELEASE_TABLET | Freq: Every day | ORAL | 0 refills | Status: AC

## 2021-06-09 MED ORDER — POLYETHYLENE GLYCOL 3350 17 G PO PACK
17.0000 g | PACK | Freq: Two times a day (BID) | ORAL | Status: DC
  Filled 2021-06-09: qty 1

## 2021-06-09 MED ORDER — SENNOSIDES-DOCUSATE SODIUM 8.6-50 MG PO TABS: 1.0000 | ORAL_TABLET | Freq: Every evening | ORAL | 0 refills | Status: AC | PRN

## 2021-06-09 MED ORDER — ENSURE ENLIVE PO LIQD: 237.0000 mL | Freq: Three times a day (TID) | ORAL | 12 refills | Status: AC

## 2021-06-09 MED ORDER — POLYETHYLENE GLYCOL 3350 17 G PO PACK: 17.0000 g | PACK | Freq: Every day | ORAL | 0 refills | Status: AC

## 2021-06-09 MED ORDER — ALUM & MAG HYDROXIDE-SIMETH 200-200-20 MG/5ML PO SUSP
15.0000 mL | Freq: Four times a day (QID) | ORAL | Status: DC | PRN
  Administered 2021-06-09: 15 mL via ORAL
  Filled 2021-06-09: qty 30

## 2021-06-09 MED ORDER — ALPRAZOLAM 1 MG PO TABS: 1.0000 mg | ORAL_TABLET | Freq: Two times a day (BID) | ORAL | 0 refills | Status: AC | PRN

## 2021-06-09 MED ORDER — NYSTATIN 100000 UNIT/GM EX POWD: 1.0000 "application " | Freq: Two times a day (BID) | CUTANEOUS | 0 refills | Status: DC

## 2021-06-09 MED ORDER — BISACODYL 10 MG RE SUPP: 10.0000 mg | Freq: Every day | RECTAL | Status: DC | PRN

## 2021-06-09 MED ORDER — LEVETIRACETAM 250 MG PO TABS: 250.0000 mg | ORAL_TABLET | Freq: Two times a day (BID) | ORAL | 0 refills | Status: AC

## 2021-06-09 MED ORDER — ACETAMINOPHEN 325 MG PO TABS: 650.0000 mg | ORAL_TABLET | Freq: Four times a day (QID) | ORAL | 0 refills | Status: AC | PRN

## 2021-06-09 MED ORDER — AMLODIPINE BESYLATE 5 MG PO TABS: 5.0000 mg | ORAL_TABLET | Freq: Every day | ORAL | 0 refills | Status: AC

## 2021-06-09 MED ORDER — ONDANSETRON HCL 4 MG PO TABS
4.0000 mg | ORAL_TABLET | Freq: Four times a day (QID) | ORAL | 0 refills | Status: AC | PRN
Start: 2021-06-09 — End: ?

## 2021-06-09 NOTE — Progress Notes (Addendum)
Pt with discharge orders. Discharge paperwork and prescriptions printed and placed in packet for receiving facility. Report called to [facility] and all questions answered. Pt to transport via [facility].

## 2021-06-09 NOTE — TOC Transition Note (Signed)
Transition of Care [facility]) - CM/SW Discharge Note   Patient Details  Name: Barbara Gibson MRN: [id number] Date of Birth: October 20, 1946  [facility] CM/SW Contact:  Vinie Sill, LCSW Phone Number: 06/09/2021, 3:29 PM   Clinical Narrative:     Patient will Discharge to: [facility]  Discharge Date: 06/09/2021 Family Notified: daughter Arby Barrette  Transport AN:VBTY ( Please call daughter when Corey Harold arrives) Family states agreeable to transport for radiation treatment when needed  Per MD patient is ready for discharge. RN, patient, and facility notified of discharge. Discharge Summary sent to facility. RN given number for report714 [phone]. Ambulance transport requested for patient.   Clinical Social Worker signing off.  Thurmond Butts, MSW, LCSW Clinical Social Worker     Final next level of care: Skilled Nursing Facility Barriers to Discharge: Barriers Resolved   Patient Goals and CMS Choice Patient states their goals for this hospitalization and ongoing recovery are:: to get rehab CMS Medicare.gov Compare Post Acute Care list provided to:: Patient Choice offered to / list presented to : Patient, Adult Children  Discharge Placement                Patient to be transferred to facility by: [place] Name of family member notified: daughter Patient and family notified of of transfer: 06/09/21  Discharge Plan and Services     Post Acute Care Choice: [place]                               Social Determinants of Health (SDOH) Interventions     Readmission Risk Interventions No flowsheet data found.

## 2021-06-09 NOTE — Discharge Summary (Addendum)
Physician Discharge Summary  Barbara Gibson LYY:[id number] DOB: 1946-06-10 DOA: 05/25/2021  PCP: Barbara Burly, MD  Admit date: 05/25/2021 Discharge date: 06/09/2021  Admitted From: Home Disposition: SNF  Recommendations for Outpatient Follow-up:  Follow up with PCP in 1-2 weeks Follow up with Neurosurgery within 1-2 weeks Follow up with Medical Oncology Dr. 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; She will need an outpatient PET Scan and her Brain Pathology will be sent for Molecular PD1 Testing as an outpatient  Follow up with Radiation Oncology for Post-Resection radiation in 2 dweeks Please obtain CMP/CBC, Mag, Phos in one week Please follow up on the following pending results:  Home Health: No Equipment/Devices: None  Discharge Condition: Stable CODE STATUS: FULL CODE  Diet recommendation: Heart Healthy Diet   Brief/Interim Summary: Patient is a 75 year old Caucasian female with a past medical history significant for but not limited to anxiety and depression, COPD, history of CAD, history of former smoker, history of breast cancer status postlumpectomy without radiation or chemotherapy who presented to the hospital for generalized weakness for 3 days.  Her work-up revealed a brain mass and neurosurgery was consulted and.  Given that she is on Plavix recommended holding Plavix up to 7 days and then proceed with resection.  She underwent resection on 06/04/2021 and Decadron was started.  She was transferred to the ICU and neurosurgery service on 7 8 after surgery.  Subsequently she was transferred back to [facility] service on 06/08/2021.  She is improved and deemed stable from a neurosurgery standpoint to be discharged.  She is having bowel movements now with bowel regimen and medical oncology is evaluated and recommending outpatient follow-up with PET scan and outpatient radiation oncology  to arrange post resection radiation.  She will be going to a skilled nursing facility at this time given her medical stability.  Discharge Diagnoses:  Principal Problem:   Brain mass Active Problems:   Panlobular emphysema (HCC)   Coronary artery disease involving native coronary artery of native heart without angina pectoris   AKI (acute kidney injury) (HCC)   Weakness   Acute lower UTI   Dehydration   Encephalopathy   Malnutrition of moderate degree   Pressure injury of skin  Right frontal brain mass, left upper lobe lung nodule 1.7 x 1 cm -History of breast cancer that is post left lumpectomy in 02/2016 followed by an aromatase inhibitor x5 years -Lung nodule suspicious for neoplastic process - Had to wait for biopsy on 7/8 due to needing to hold Plavix for 7 days; will resume Plavix on 06/10/2021 - Evaluated by oncology on 6/30, Dr. 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 [name] is recommending 2 mg of dexamethasone twice daily - PT recommending SNF- patient in agreement- have consulted [facility] -Neurosurgery recommending continuing Diamox . -Surgery is now cleared the patient for discharge   Metabolic Encephalopathy - In setting of UTI - She was treated with ceftriaxone from 6/28 through 7/1 -EEG 6/29 to evaluate for seizures was suggestive of "cortical dysfunction in right frontal region likely  secondary to underlying mass.  Additionally rhythmic delta slowing in frontal region is on the ictal Indur ictal continue home with low potential for seizures" -Mental status improved and is back to  baseline -She was started on Keppra on 6/29 and continues on 250 mg twice daily for seizure prevention   Leukocytosis -In the setting of steroid margination -Patient's WBC went from 13.2 is now 9.1 -Continue to monitor and trend and repeat CBC within 1 week.   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 L o2 -On Anoro Ellipta as outpatient -SpO2: 96 % O2 Flow Rate (L/min): 3 L/min -Continue supplemental oxygen and follow respiratory status carefully as an outpatient   AKI -Creatinine 1.76 on 6/28 -Improved to a BUN/Cr of 29/0.71 -Avoid nephrotoxic medications, contrast dyes, hypotension and renally adjust medications -Repeat CMP within 1 week   Coronary artery disease involving native coronary artery of native heart without angina pectoris -Continue statin and metoprolol -Initially was holding Plavix but can resume on 06/10/2021 at the recommendation of neurosurgery   Anxiety/Depression -Continue citalopram and twice daily as needed Xanax   Intertrigo -Continue nystatin   Essential hypertension -Continue amlodipine at 5 mg daily and metoprolol at 12.5 mg twice daily   Insomnia - Using trazodone as needed  Constipation  -Adjusted bowel regimen and this has resolved -Continue bowel regimen at discharge  Malnutrition of Moderate Degree -Nutritionist consulted for further evaluation and recommendations -They are recommending Ensure Enlive po TID and pateint can be subsituted with Boost High Protient from home -C/w MVI + Minerals    Discharge Instructions  Discharge Instructions     Call MD for:  difficulty breathing, headache or visual disturbances   Complete by: As directed    Call MD for:  extreme fatigue   Complete by: As directed    Call MD for:  hives   Complete by: As directed    Call MD for:  persistant dizziness or light-headedness   Complete by: As directed    Call MD for:  persistant nausea and vomiting    Complete by: As directed    Call MD for:  redness, tenderness, or signs of infection (pain, swelling, redness, odor or green/yellow discharge around incision site)   Complete by: As directed    Call MD for:  severe uncontrolled pain   Complete by: As directed    Call MD for:  temperature >100.4   Complete by: As directed    Diet - low sodium heart healthy   Complete by: As directed    Discharge instructions   Complete by: As directed    You were cared for by a hospitalist during your hospital stay. If you have any questions about your discharge medications or the care you received while you were in the hospital after you are discharged, you can call the unit and ask to speak with the hospitalist on call if the hospitalist that took care of you is not available. Once you are discharged, your primary care physician will handle any further medical issues. Please note that NO REFILLS for any discharge medications will be authorized once you are discharged, as it is imperative that you return to your primary care physician (or establish a relationship with a primary care physician if you do not have one) for your aftercare needs so that they can reassess your need for medications and monitor your lab values.  Follow up with PCP, Medical Oncology, and Neurosurgery within 1-2 weeks. Take all  medications as prescribed. If symptoms change or worsen please return to the ED for evaluation   Discharge wound care:   Complete by: As directed    Barrier Cream and Turning for Sacral Wound; Staple Removal on Forehead and Scalp per Neurosurgery   Increase activity slowly   Complete by: As directed       Allergies as of 06/09/2021       Reactions   Sulfa Antibiotics Nausea And Vomiting        Medication List     TAKE these medications    acetaminophen 325 MG tablet Commonly known as: TYLENOL Take 2 tablets (650 mg total) by mouth every 6 (six) hours as needed for mild pain (or Fever >/= 101).    acetaZOLAMIDE 250 MG tablet Commonly known as: DIAMOX Take 250 mg by mouth 2 (two) times daily.   ALPRAZolam 1 MG tablet Commonly known as: XANAX Take 1 tablet (1 mg total) by mouth 2 (two) times daily as needed for anxiety. What changed:  when to take this reasons to take this   amLODipine 5 MG tablet Commonly known as: NORVASC Take 1 tablet (5 mg total) by mouth daily. Start taking on: June 10, 2021   Anoro Ellipta 62.5-25 MCG/INH Aepb Generic drug: umeclidinium-vilanterol Inhale 1 puff into the lungs daily.   benazepril 5 MG tablet Commonly known as: LOTENSIN Take 5 mg by mouth daily.   buPROPion 150 MG 24 hr tablet Commonly known as: WELLBUTRIN XL Take 150 mg by mouth daily.   Cholecalciferol 50 MCG (2000 UT) Caps Take 1 capsule by mouth daily.   citalopram 20 MG tablet Commonly known as: CELEXA Take 20 mg by mouth daily.   clopidogrel 75 MG tablet Commonly known as: Plavix Take 1 tablet (75 mg total) by mouth daily.   dexamethasone 2 MG tablet Commonly known as: DECADRON Take 1 tablet (2 mg total) by mouth 2 (two) times daily.   feeding supplement Liqd Take 237 mLs by mouth 3 (three) times daily between meals.   HYDROcodone-acetaminophen 5-325 MG tablet Commonly known as: NORCO/VICODIN Take 1 tablet by mouth every 4 (four) hours as needed for moderate pain.   levETIRAcetam 250 MG tablet Commonly known as: KEPPRA Take 1 tablet (250 mg total) by mouth 2 (two) times daily.   metoprolol tartrate 25 MG tablet Commonly known as: Lopressor Take 0.5 tablets (12.5 mg total) by mouth 2 (two) times daily. What changed: how much to take   multivitamin with minerals Tabs tablet Take 1 tablet by mouth daily.   nystatin powder Commonly known as: MYCOSTATIN/NYSTOP Apply 1 application topically 2 (two) times daily.   ondansetron 4 MG tablet Commonly known as: ZOFRAN Take 1 tablet (4 mg total) by mouth every 6 (six) hours as needed for nausea.   pantoprazole  40 MG tablet Commonly known as: PROTONIX Take 1 tablet (40 mg total) by mouth daily. Start taking on: June 10, 2021   polyethylene glycol 17 g packet Commonly known as: MIRALAX / GLYCOLAX Take 17 g by mouth daily.   PROBIOTIC PO Take 1 tablet by mouth in the morning and at bedtime.   senna-docusate 8.6-50 MG tablet Commonly known as: Senokot-S Take 1 tablet by mouth at bedtime as needed for mild constipation.   simvastatin 20 MG tablet Commonly known as: ZOCOR Take 20 mg by mouth daily.               Discharge Care Instructions  (From admission, onward)  Start     Ordered   06/09/21 0000  Discharge wound care:       Comments: Barrier Cream and Turning for Sacral Wound; Staple Removal on Forehead and Scalp per Neurosurgery   06/09/21 1152            Contact information for after-discharge care     [facility] .   Service: Skilled Nursing Contact information: [address] [address] [phone]                    Allergies  Allergen Reactions   Sulfa Antibiotics Nausea And Vomiting   Consultations: Neurosurgery Medical Oncology   Procedures/Studies: DG Chest 2 View  Result Date: 05/25/2021 CLINICAL DATA:  Weakness. EXAM: CHEST - 2 VIEW COMPARISON:  Chest x-ray report 03/05/2016. FINDINGS: Mediastinum and hilar structures normal. Heart size normal. Low lung volumes with mild bibasilar atelectasis. Mild left base infiltrate cannot be excluded. No pleural effusion or pneumothorax. IMPRESSION: Low lung volumes with mild bibasilar atelectasis. Mild left base infiltrate cannot be excluded. Electronically Signed   By: Marcello Moores  Register   On: 05/25/2021 06:49   MR BRAIN WO CONTRAST  Result Date: 05/25/2021 CLINICAL DATA:  Delirium, prior history of DCIS left breast EXAM: MRI HEAD WITHOUT CONTRAST TECHNIQUE: Multiplanar, multiecho pulse sequences of the brain and  surrounding structures were obtained without intravenous contrast. COMPARISON:  None. FINDINGS: Brain: There is a 2.7 x 2.7 cm rounded mass of the anteroinferior right frontal lobe with extensive surrounding edema. There is regional mass effect including leftward midline shift with subfalcine herniation measuring 1 cm. No significant central herniation. No evidence of acute infarction or hemorrhage. Additional patchy T2 hyperintensity in the supratentorial and pontine white matter is nonspecific but probably reflects chronic microvascular ischemic changes. There is no extra-axial collection. There is no hydrocephalus. Vascular: Major vessel flow voids at the skull base are preserved. Skull and upper cervical spine: Normal marrow signal is preserved. Sinuses/Orbits: Minor mucosal thickening.  Orbits are unremarkable. Other: Sella is unremarkable.  Mastoid air cells are clear. IMPRESSION: 2.7 cm anterior inferior right frontal lobe mass with extensive surrounding edema. Metastasis is the primary differential consideration. There is 1 cm of leftward midline shift. No hydrocephalus. Postcontrast imaging is recommended to evaluate for additional lesions. These results will be called to the ordering clinician or representative by the Radiologist Assistant, and communication documented in the PACS or [employer]. Electronically Signed   By: Macy Mis M.D.   On: 05/25/2021 12:27   MR BRAIN W WO CONTRAST  Result Date: 06/05/2021 CLINICAL DATA:  Postop craniotomy EXAM: MRI HEAD WITHOUT AND WITH CONTRAST TECHNIQUE: Multiplanar, multiecho pulse sequences of the brain and surrounding structures were obtained without and with intravenous contrast. CONTRAST:  7.48mL GADAVIST GADOBUTROL 1 MMOL/ML IV SOLN COMPARISON:  Brain MRI 05/25/2021 FINDINGS: Brain: Right frontal mass resection with blood products in the cavity and adjacent extra-axial space. Wispy peripheral enhancement at the cavity, likely postoperative.  Extensive but already diminished vasogenic edema. The anterior midline is displaced to the left by 6 mm. No hydrocephalus. No complicating infarct. No newly seen metastatic lesions. Vascular: Preserved flow voids and vascular enhancements. Skull and upper cervical spine: Unremarkable craniotomy Sinuses/Orbits: Negative IMPRESSION: Right frontal mass resection without adverse feature. Resection cavity enhancement is wispy and likely postoperative. Electronically Signed   By: Monte Fantasia M.D.   On: 06/05/2021 05:46   MR BRAIN W WO  CONTRAST  Result Date: 05/26/2021 CLINICAL DATA:  Intracranial metastatic disease. Generalized weakness. EXAM: MRI HEAD WITHOUT AND WITH CONTRAST TECHNIQUE: Multiplanar, multiecho pulse sequences of the brain and surrounding structures were obtained without and with intravenous contrast. CONTRAST:  7.23mL GADAVIST GADOBUTROL 1 MMOL/ML IV SOLN COMPARISON:  Brain MRI 05/25/2021 at 11:06 a.m. FINDINGS: Brain: Unchanged appearance of right frontal lesion with large amount of surrounding edema. Unchanged leftward midline shift of 8 mm. Right frontal mass shows heterogeneous contrast enhancement and measures 3.2 x 2.6 cm. There are no other contrast-enhancing lesions. There is no acute or chronic hemorrhage. Normal white matter signal, parenchymal volume and CSF spaces. The midline structures are normal. Vascular: Major flow voids are preserved. Skull and upper cervical spine: Normal calvarium and skull base. Visualized upper cervical spine and soft tissues are normal. Sinuses/Orbits:No paranasal sinus fluid levels or advanced mucosal thickening. No mastoid or middle ear effusion. Normal orbits. IMPRESSION: 1. Right frontal mass measuring 3.2 x 2.6 cm, with large amount of surrounding edema and 8 mm leftward midline shift. 2. No other metastatic lesions. Electronically Signed   By: Ulyses Jarred M.D.   On: 05/26/2021 00:04   DG Chest Port 1 View  Result Date: 05/28/2021 CLINICAL DATA:   Shortness of breath. EXAM: PORTABLE CHEST 1 VIEW COMPARISON:  Chest x-ray dated May 25, 2021. FINDINGS: Normal heart size. Unchanged upper mediastinal contour abnormality related to known multinodular thyroid goiter. Chronically coarsened interstitial markings with emphysematous changes. No focal consolidation, pleural effusion, or pneumothorax. No acute osseous abnormality. Old right-sided rib fractures again noted. IMPRESSION: 1. No active disease. 2. COPD. Electronically Signed   By: Titus Dubin M.D.   On: 05/28/2021 12:47   DG Chest Port 1V same Day  Result Date: 06/04/2021 CLINICAL DATA:  Central line placement. EXAM: PORTABLE CHEST 1 VIEW COMPARISON:  05/28/2021 FINDINGS: A new left jugular catheter has been placed and terminates over the upper SVC. The cardiac silhouette is normal in size. Widening of the upper mediastinum is unchanged and corresponds to known thyroid goiter. There is chronic coarsening of the interstitial markings with underlying emphysema. No confluent airspace opacity, pulmonary edema, sizable pleural effusion, or pneumothorax is identified. Aortic atherosclerosis an old right-sided rib fractures are noted. IMPRESSION: Interval left jugular catheter placement as above. Electronically Signed   By: Logan Bores M.D.   On: 06/04/2021 13:02   EEG adult  Result Date: 05/26/2021 Lora Havens, MD     05/26/2021 10:06 AM Patient Name: Barbara Gibson MRN: [id number] Epilepsy Attending: Lora Havens Referring Physician/Provider: Dr Lesleigh Noe Date: 05/26/2021 Duration: 23.51 mins Patient history: 75 year old female with history of breast cancer now with brain mets presented with weakness.  Patient's daughter also reported episodes of left side shaking followed by weakness on the left side.  EEG to evaluate for seizures. Level of alertness: Awake AEDs during EEG study: Keppra Technical aspects: This EEG study was done with scalp electrodes positioned according to the 10-20  International system of electrode placement. Electrical activity was acquired at a sampling rate of 500Hz  and reviewed with a high frequency filter of 70Hz  and a low frequency filter of 1Hz . EEG data were recorded continuously and digitally stored. Description: The posterior dominant rhythm consists of 8-9 Hz activity of moderate voltage (25-35 uV) seen predominantly in posterior head regions, symmetric and reactive to eye opening and eye closing. EEG showed continuous rhythmic 2 to 3 Hz sharply contoured delta slowing in her right frontal region. Hyperventilation and photic stimulation  were not performed.   ABNORMALITY -Continuous rhythmic delta slow, right frontal region IMPRESSION: This study is suggestive of cortical dysfunction in the right frontal region likely secondary to underlying mass.  Additionally, rhythmic delta slowing in frontal region is on the ictal-interictal continuum with low potential for seizures.  No seizures or definite epileptiform discharges were seen throughout the recording. Priyanka Barbra Sarks   CT CHEST ABDOMEN PELVIS WO CONTRAST  Result Date: 05/25/2021 CLINICAL DATA:  75 year old female with evaluation for metastatic disease. EXAM: CT CHEST, ABDOMEN AND PELVIS WITHOUT CONTRAST TECHNIQUE: Multidetector CT imaging of the chest, abdomen and pelvis was performed following the standard protocol without IV contrast. COMPARISON:  None. FINDINGS: Evaluation of this exam is limited in the absence of intravenous contrast. CT CHEST FINDINGS Cardiovascular: There is no cardiomegaly or pericardial effusion. Three-vessel coronary vascular calcification. Advanced atherosclerotic calcification of the thoracic aorta. No aneurysmal dilatation. The central pulmonary arteries are grossly unremarkable. Mediastinum/Nodes: No hilar or mediastinal adenopathy. The esophagus is grossly unremarkable. Enlarged thyroid gland with multiple nodules, likely multinodular goiter. No mediastinal fluid collection.  Lungs/Pleura: Background of emphysema. There is a 1.7 x 1.0 cm left upper lobe subpleural nodule with irregular margins suspicious for a neoplastic process (32/7). Correlation with history of known malignancy and/or multidisciplinary consult and further evaluation with PET-CT is recommended. There is a 7 mm nodule with irregular margins in the left upper lobe (21/7). Apparent small nodular densities in the right upper lobe likely confluence of interstitial markings. There is however a 3 mm right upper lobe nodule (30/7). No lobar consolidation, pleural effusion, or pneumothorax. The central airways are patent. Musculoskeletal: Old healed right posterior rib fractures. No acute osseous pathology. Osteopenia with degenerative changes of the spine. CT ABDOMEN PELVIS FINDINGS No intra-abdominal free air or free fluid. Hepatobiliary: The liver is unremarkable. No intrahepatic biliary dilatation. The gallbladder is unremarkable. Pancreas: Unremarkable. No pancreatic ductal dilatation or surrounding inflammatory changes. Spleen: Normal in size without focal abnormality. Adrenals/Urinary Tract: The adrenal glands are unremarkable. There is a 2 mm nonobstructing right renal upper pole calculus. No hydronephrosis. The left kidney is unremarkable. The visualized ureters and urinary bladder are unremarkable. Stomach/Bowel: There is severe sigmoid diverticulosis without active inflammatory changes. There is no bowel obstruction or active inflammation. The appendix is normal. Vascular/Lymphatic: Advanced aortoiliac atherosclerotic disease. The IVC is unremarkable. No portal venous gas. There is no adenopathy. Reproductive: The uterus and ovaries are grossly unremarkable. No adnexal masses. Other: None Musculoskeletal: Osteopenia with degenerative changes of the spine. No acute osseous pathology. IMPRESSION: 1. Left upper lobe 1.7 x 1.0 cm subpleural nodule suspicious for a neoplastic process. Additional smaller pulmonary nodules  as above. Correlation with history of known malignancy or multidisciplinary consult and further evaluation with PET-CT is recommended. 2. No evidence of metastatic disease in the abdomen and pelvis. 3. Severe sigmoid diverticulosis. No bowel obstruction. Normal appendix. 4. Multinodular goiter. 5. Aortic Atherosclerosis (ICD10-I70.0) and Emphysema (ICD10-J43.9). Electronically Signed   By: Anner Crete M.D.   On: 05/25/2021 23:53   CT STEALTH HEAD W/O CONTRAST  Result Date: 06/03/2021 CLINICAL DATA:  Intracranial mass lesion for surgical planning. EXAM: CT HEAD WITHOUT CONTRAST TECHNIQUE: Contiguous axial images were obtained from the base of the skull through the vertex without intravenous contrast. COMPARISON:  MRI 05/25/2021 FINDINGS: Brain: Approximately 3 cm in diameter intra-axial mass of the inferior right frontal lobe is redemonstrated, with regional vasogenic edema and right-to-left shift of approximately 6 mm. No hemorrhagic component. No second lesion is  seen. No hydrocephalus. Evidence of ischemic changes. Vascular: There is atherosclerotic calcification of the major vessels at the base of the brain. Skull: Negative Sinuses/Orbits: Clear/normal Other: None IMPRESSION: 3 cm intra-axial mass in the inferior right frontal lobe with regional vasogenic edema and right-to-left shift of 6 mm. Scan done for surgical planning/guidance. Electronically Signed   By: Nelson Chimes M.D.   On: 06/03/2021 13:30    Subjective: Seen and examined at bedside and was doing okay.  Still had a headache.  Had not had a bowel movement until this afternoon.  No chest pain or shortness of breath.  No other concerns or complaints this time is medically stable to be discharged to skilled nursing facility.  Discharge Exam: Vitals:   06/09/21 0730 06/09/21 1105  BP: 121/60 (!) 141/54  Pulse: 72 79  Resp: 16 19  Temp: 97.8 F (36.6 C) 98 F (36.7 C)  SpO2: 96%    Vitals:   06/08/21 2300 06/09/21 0345 06/09/21 0730  06/09/21 1105  BP: (!) 126/57 (!) 121/50 121/60 (!) 141/54  Pulse: 79 67 72 79  Resp: 19 20 16 19   Temp: 97.9 F (36.6 C) 97.9 F (36.6 C) 97.8 F (36.6 C) 98 F (36.7 C)  TempSrc: Oral Oral Oral Oral  SpO2: 97% 98% 96%   Weight:      Height:       General: Pt is alert, awake, not in acute distress; has a large scalp incision with staples from her surgical resection Cardiovascular: RRR, S1/S2 +, no rubs, no gallops Respiratory: Diminished bilaterally, no wheezing, no rhonchi Abdominal: Soft, NT, ND, bowel sounds + Extremities: Trace edema, no cyanosis  The results of significant diagnostics from this hospitalization (including imaging, microbiology, ancillary and laboratory) are listed below for reference.    Microbiology: Recent Results (from the past 240 hour(s))  Surgical PCR screen     Status: Abnormal   Collection Time: 06/03/21 10:08 PM   Specimen: Nasal Mucosa; Nasal Swab  Result Value Ref Range Status   MRSA, PCR NEGATIVE NEGATIVE Final   Staphylococcus aureus POSITIVE (A) NEGATIVE Final    Comment: (NOTE) The Xpert SA Assay (FDA approved for NASAL specimens in patients 48 years of age and older), is one component of a comprehensive surveillance program. It is not intended to diagnose infection nor to guide or monitor treatment. Performed at [facility], [place] [address] [place], [place] [address]   SARS CORONAVIRUS 2 (TAT 6-24 HRS) Nasopharyngeal Nasopharyngeal Swab     Status: None   Collection Time: 06/07/21  6:20 PM   Specimen: Nasopharyngeal Swab  Result Value Ref Range Status   SARS Coronavirus 2 NEGATIVE NEGATIVE Final    Comment: (NOTE) SARS-CoV-2 target nucleic acids are NOT DETECTED.  The SARS-CoV-2 RNA is generally detectable in upper and lower respiratory specimens during the acute phase of infection. Negative results do not preclude SARS-CoV-2 infection, do not rule out co-infections with other pathogens, and should not be used as the sole  basis for treatment or other patient management decisions. Negative results must be combined with clinical observations, patient history, and epidemiological information. The expected result is Negative.  Fact Sheet for Patients: SugarRoll.be  Fact Sheet for Healthcare Providers: https://www.woods-mathews.com/  This test is not yet approved or cleared by the [place] FDA and  has been authorized for detection and/or diagnosis of SARS-CoV-2 by FDA under an Emergency Use Authorization (EUA). This EUA will remain  in effect (meaning this test can be  used) for the duration of the COVID-19 declaration under Se ction 564(b)(1) of the Act, 21 U.S.C. section 360bbb-3(b)(1), unless the authorization is terminated or revoked sooner.  Performed at [facility], [place] [address] [place], [place] [address]     Labs: BNP (last 3 results) Recent Labs    05/25/21 0603  BNP 12.8   Basic Metabolic Panel: Recent Labs  Lab 06/02/21 1752 06/03/21 0151 06/04/21 0917 06/04/21 1046 06/05/21 0500 06/09/21 0929  NA 139 140 137 131* 139 138  K 4.8 5.5* 4.2 4.0 4.7 4.2  CL 82* 85*  --   --  95* 96*  CO2 >50* >50*  --   --  39* 38*  GLUCOSE 161* 188*  --   --  136* 277*  BUN 27* 23  --   --  28* 29*  CREATININE 0.71 0.66  --   --  0.77 0.71  CALCIUM 9.3 9.5  --   --  9.2 9.3  MG  --   --   --   --   --  1.9  PHOS  --   --   --   --   --  2.7   Liver Function Tests: Recent Labs  Lab 06/09/21 0929  AST 12*  ALT 15  ALKPHOS 32*  BILITOT 0.3  PROT 5.4*  ALBUMIN 2.9*   No results for input(s): LIPASE, AMYLASE in the last 168 hours. No results for input(s): AMMONIA in the last 168 hours. CBC: Recent Labs  Lab 06/04/21 0917 06/04/21 1046 06/05/21 0500 06/09/21 0929  WBC  --   --  13.2* 9.1  NEUTROABS  --   --   --  7.7  HGB 10.5* 9.5* 10.8* 10.9*  HCT 31.0* 28.0* 35.3* 35.5*  MCV  --   --  96.4 94.9  PLT  --   --  181 185    Cardiac Enzymes: No results for input(s): CKTOTAL, CKMB, CKMBINDEX, TROPONINI in the last 168 hours. BNP: Invalid input(s): POCBNP CBG: No results for input(s): GLUCAP in the last 168 hours. D-Dimer No results for input(s): DDIMER in the last 72 hours. Hgb A1c No results for input(s): HGBA1C in the last 72 hours. Lipid Profile No results for input(s): CHOL, HDL, LDLCALC, TRIG, CHOLHDL, LDLDIRECT in the last 72 hours. Thyroid function studies No results for input(s): TSH, T4TOTAL, T3FREE, THYROIDAB in the last 72 hours.  Invalid input(s): FREET3 Anemia work up No results for input(s): VITAMINB12, FOLATE, FERRITIN, TIBC, IRON, RETICCTPCT in the last 72 hours. Urinalysis    Component Value Date/Time   COLORURINE YELLOW 05/25/2021 0858   APPEARANCEUR HAZY (A) 05/25/2021 0858   LABSPEC 1.013 05/25/2021 0858   PHURINE 6.0 05/25/2021 0858   GLUCOSEU NEGATIVE 05/25/2021 0858   HGBUR SMALL (A) 05/25/2021 0858   BILIRUBINUR NEGATIVE 05/25/2021 0858   KETONESUR 20 (A) 05/25/2021 0858   PROTEINUR 30 (A) 05/25/2021 0858   NITRITE NEGATIVE 05/25/2021 0858   LEUKOCYTESUR LARGE (A) 05/25/2021 0858   Sepsis Labs Invalid input(s): PROCALCITONIN,  WBC,  LACTICIDVEN Microbiology Recent Results (from the past 240 hour(s))  Surgical PCR screen     Status: Abnormal   Collection Time: 06/03/21 10:08 PM   Specimen: Nasal Mucosa; Nasal Swab  Result Value Ref Range Status   MRSA, PCR NEGATIVE NEGATIVE Final   Staphylococcus aureus POSITIVE (A) NEGATIVE Final    Comment: (NOTE) The Xpert SA Assay (FDA approved for NASAL specimens in patients 70 years of age and older), is  one component of a comprehensive surveillance program. It is not intended to diagnose infection nor to guide or monitor treatment. Performed at [facility], [place] [address], [place], [place] [address]   SARS CORONAVIRUS 2 (TAT 6-24 HRS) Nasopharyngeal Nasopharyngeal Swab     Status: None   Collection Time: 06/07/21   6:20 PM   Specimen: Nasopharyngeal Swab  Result Value Ref Range Status   SARS Coronavirus 2 NEGATIVE NEGATIVE Final    Comment: (NOTE) SARS-CoV-2 target nucleic acids are NOT DETECTED.  The SARS-CoV-2 RNA is generally detectable in upper and lower respiratory specimens during the acute phase of infection. Negative results do not preclude SARS-CoV-2 infection, do not rule out co-infections with other pathogens, and should not be used as the sole basis for treatment or other patient management decisions. Negative results must be combined with clinical observations, patient history, and epidemiological information. The expected result is Negative.  Fact Sheet for Patients: SugarRoll.be  Fact Sheet for Healthcare Providers: https://www.woods-mathews.com/  This test is not yet approved or cleared by the [place] FDA and  has been authorized for detection and/or diagnosis of SARS-CoV-2 by FDA under an Emergency Use Authorization (EUA). This EUA will remain  in effect (meaning this test can be used) for the duration of the COVID-19 declaration under Se ction 564(b)(1) of the Act, 21 U.S.C. section 360bbb-3(b)(1), unless the authorization is terminated or revoked sooner.  Performed at [facility], [place] [address] [place], [place] [address]    Time coordinating discharge: 35 minutes  SIGNED:  Kerney Elbe, DO [facility] 06/09/2021, 11:53 AM Pager is on [place]  If 7PM-7AM, please contact night-coverage [email or web address]

## 2021-06-09 NOTE — Progress Notes (Signed)
   Providing Compassionate, Quality Care - Together  NEUROSURGERY PROGRESS NOTE   S: No issues overnight.  O: EXAM:  BP 121/60 (BP Location: Right Arm)   Pulse 72   Temp 97.8 F (36.6 C) (Oral)   Resp 16   Ht 5\' 6"  (1.676 m)   Wt 68.9 kg   SpO2 96%   BMI 24.52 kg/m   Awake, alert, oriented x3 PERRLA EOMI Face symmetric Dressing clean dry and intact Speech fluent, appropriate CNs grossly intact 5/5 BUE/BLE   ASSESSMENT:  75 y.o. female with   Right frontal metastatic lesion, lung AdenoCA   -Status post right frontal craniotomy resection of tumor 06/04/2021   PLAN: -PT/OT -Keppra -Wean Decadron -dvt ppx    Thank you for allowing me to participate in this patient's care.  Please do not hesitate to call with questions or concerns.   Elwin Sleight, [facility] Cell: [phone]

## 2021-06-09 NOTE — Progress Notes (Signed)
PTAR came and took a patient to [facility]. VS stable. PIV is removed. Patient's daughter, Page notified.

## 2021-06-09 NOTE — Progress Notes (Addendum)
HEMATOLOGY-ONCOLOGY PROGRESS NOTE  SUBJECTIVE: She continues to have a headache.  No other complaint.  She plans to be discharged to a nursing facility.  PHYSICAL EXAMINATION: ECOG PERFORMANCE STATUS: 2 - Symptomatic, <50% confined to bed  Vitals:   06/09/21 0730 06/09/21 1105  BP: 121/60 (!) 141/54  Pulse: 72 79  Resp: 16 19  Temp: 97.8 F (36.6 C) 98 F (36.7 C)  SpO2: 96%    Filed Weights   06/04/21 0500 06/04/21 1330 06/06/21 0500  Weight: 156 lb 15.5 oz (71.2 kg) 160 lb 15 oz (73 kg) 151 lb 14.4 oz (68.9 kg)    Intake/Output from previous day: 07/12 0701 - 07/13 0700 In: 600 [P.O.:600] Out: 750 [Urine:750] HEENT: No thrush NEURO: alert & oriented x 3 with fluent speech, moves all extremities to command  LABORATORY DATA:  I have reviewed the data as listed CMP Latest Ref Rng & Units 06/09/2021 06/05/2021 06/04/2021  Glucose 70 - 99 mg/dL 277(H) 136(H) -  BUN 8 - 23 mg/dL 29(H) 28(H) -  Creatinine 0.44 - 1.00 mg/dL 0.71 0.77 -  Sodium 135 - 145 mmol/L 138 139 131(L)  Potassium 3.5 - 5.1 mmol/L 4.2 4.7 4.0  Chloride 98 - 111 mmol/L 96(L) 95(L) -  CO2 22 - 32 mmol/L 38(H) 39(H) -  Calcium 8.9 - 10.3 mg/dL 9.3 9.2 -  Total Protein 6.5 - 8.1 g/dL 5.4(L) - -  Total Bilirubin 0.3 - 1.2 mg/dL 0.3 - -  Alkaline Phos 38 - 126 U/L 32(L) - -  AST 15 - 41 U/L 12(L) - -  ALT 0 - 44 U/L 15 - -    Lab Results  Component Value Date   WBC 9.1 06/09/2021   HGB 10.9 (L) 06/09/2021   HCT 35.5 (L) 06/09/2021   MCV 94.9 06/09/2021   PLT 185 06/09/2021   NEUTROABS 7.7 06/09/2021    DG Chest 2 View  Result Date: 05/25/2021 CLINICAL DATA:  Weakness. EXAM: CHEST - 2 VIEW COMPARISON:  Chest x-ray report 03/05/2016. FINDINGS: Mediastinum and hilar structures normal. Heart size normal. Low lung volumes with mild bibasilar atelectasis. Mild left base infiltrate cannot be excluded. No pleural effusion or pneumothorax. IMPRESSION: Low lung volumes with mild bibasilar atelectasis. Mild left  base infiltrate cannot be excluded. Electronically Signed   By: Marcello Moores  Register   On: 05/25/2021 06:49   MR BRAIN WO CONTRAST  Result Date: 05/25/2021 CLINICAL DATA:  Delirium, prior history of DCIS left breast EXAM: MRI HEAD WITHOUT CONTRAST TECHNIQUE: Multiplanar, multiecho pulse sequences of the brain and surrounding structures were obtained without intravenous contrast. COMPARISON:  None. FINDINGS: Brain: There is a 2.7 x 2.7 cm rounded mass of the anteroinferior right frontal lobe with extensive surrounding edema. There is regional mass effect including leftward midline shift with subfalcine herniation measuring 1 cm. No significant central herniation. No evidence of acute infarction or hemorrhage. Additional patchy T2 hyperintensity in the supratentorial and pontine white matter is nonspecific but probably reflects chronic microvascular ischemic changes. There is no extra-axial collection. There is no hydrocephalus. Vascular: Major vessel flow voids at the skull base are preserved. Skull and upper cervical spine: Normal marrow signal is preserved. Sinuses/Orbits: Minor mucosal thickening.  Orbits are unremarkable. Other: Sella is unremarkable.  Mastoid air cells are clear. IMPRESSION: 2.7 cm anterior inferior right frontal lobe mass with extensive surrounding edema. Metastasis is the primary differential consideration. There is 1 cm of leftward midline shift. No hydrocephalus. Postcontrast imaging is recommended to evaluate for additional  lesions. These results will be called to the ordering clinician or representative by the Radiologist Assistant, and communication documented in the PACS or [employer]. Electronically Signed   By: Macy Mis M.D.   On: 05/25/2021 12:27   MR BRAIN W WO CONTRAST  Result Date: 06/05/2021 CLINICAL DATA:  Postop craniotomy EXAM: MRI HEAD WITHOUT AND WITH CONTRAST TECHNIQUE: Multiplanar, multiecho pulse sequences of the brain and surrounding structures were  obtained without and with intravenous contrast. CONTRAST:  7.74mL GADAVIST GADOBUTROL 1 MMOL/ML IV SOLN COMPARISON:  Brain MRI 05/25/2021 FINDINGS: Brain: Right frontal mass resection with blood products in the cavity and adjacent extra-axial space. Wispy peripheral enhancement at the cavity, likely postoperative. Extensive but already diminished vasogenic edema. The anterior midline is displaced to the left by 6 mm. No hydrocephalus. No complicating infarct. No newly seen metastatic lesions. Vascular: Preserved flow voids and vascular enhancements. Skull and upper cervical spine: Unremarkable craniotomy Sinuses/Orbits: Negative IMPRESSION: Right frontal mass resection without adverse feature. Resection cavity enhancement is wispy and likely postoperative. Electronically Signed   By: Monte Fantasia M.D.   On: 06/05/2021 05:46   MR BRAIN W WO CONTRAST  Result Date: 05/26/2021 CLINICAL DATA:  Intracranial metastatic disease. Generalized weakness. EXAM: MRI HEAD WITHOUT AND WITH CONTRAST TECHNIQUE: Multiplanar, multiecho pulse sequences of the brain and surrounding structures were obtained without and with intravenous contrast. CONTRAST:  7.18mL GADAVIST GADOBUTROL 1 MMOL/ML IV SOLN COMPARISON:  Brain MRI 05/25/2021 at 11:06 a.m. FINDINGS: Brain: Unchanged appearance of right frontal lesion with large amount of surrounding edema. Unchanged leftward midline shift of 8 mm. Right frontal mass shows heterogeneous contrast enhancement and measures 3.2 x 2.6 cm. There are no other contrast-enhancing lesions. There is no acute or chronic hemorrhage. Normal white matter signal, parenchymal volume and CSF spaces. The midline structures are normal. Vascular: Major flow voids are preserved. Skull and upper cervical spine: Normal calvarium and skull base. Visualized upper cervical spine and soft tissues are normal. Sinuses/Orbits:No paranasal sinus fluid levels or advanced mucosal thickening. No mastoid or middle ear effusion.  Normal orbits. IMPRESSION: 1. Right frontal mass measuring 3.2 x 2.6 cm, with large amount of surrounding edema and 8 mm leftward midline shift. 2. No other metastatic lesions. Electronically Signed   By: Ulyses Jarred M.D.   On: 05/26/2021 00:04   DG Chest Port 1 View  Result Date: 05/28/2021 CLINICAL DATA:  Shortness of breath. EXAM: PORTABLE CHEST 1 VIEW COMPARISON:  Chest x-ray dated May 25, 2021. FINDINGS: Normal heart size. Unchanged upper mediastinal contour abnormality related to known multinodular thyroid goiter. Chronically coarsened interstitial markings with emphysematous changes. No focal consolidation, pleural effusion, or pneumothorax. No acute osseous abnormality. Old right-sided rib fractures again noted. IMPRESSION: 1. No active disease. 2. COPD. Electronically Signed   By: Titus Dubin M.D.   On: 05/28/2021 12:47   DG Chest Port 1V same Day  Result Date: 06/04/2021 CLINICAL DATA:  Central line placement. EXAM: PORTABLE CHEST 1 VIEW COMPARISON:  05/28/2021 FINDINGS: A new left jugular catheter has been placed and terminates over the upper SVC. The cardiac silhouette is normal in size. Widening of the upper mediastinum is unchanged and corresponds to known thyroid goiter. There is chronic coarsening of the interstitial markings with underlying emphysema. No confluent airspace opacity, pulmonary edema, sizable pleural effusion, or pneumothorax is identified. Aortic atherosclerosis an old right-sided rib fractures are noted. IMPRESSION: Interval left jugular catheter placement as above. Electronically Signed   By: Seymour Bars.D.  On: 06/04/2021 13:02   EEG adult  Result Date: 05/26/2021 Lora Havens, MD     05/26/2021 10:06 AM Patient Name: Barbara Gibson MRN: [id number] Epilepsy Attending: Lora Havens Referring Physician/Provider: Dr Lesleigh Noe Date: 05/26/2021 Duration: 23.51 mins Patient history: 75 year old female with history of breast cancer now with brain mets  presented with weakness.  Patient's daughter also reported episodes of left side shaking followed by weakness on the left side.  EEG to evaluate for seizures. Level of alertness: Awake AEDs during EEG study: Keppra Technical aspects: This EEG study was done with scalp electrodes positioned according to the 10-20 International system of electrode placement. Electrical activity was acquired at a sampling rate of 500Hz  and reviewed with a high frequency filter of 70Hz  and a low frequency filter of 1Hz . EEG data were recorded continuously and digitally stored. Description: The posterior dominant rhythm consists of 8-9 Hz activity of moderate voltage (25-35 uV) seen predominantly in posterior head regions, symmetric and reactive to eye opening and eye closing. EEG showed continuous rhythmic 2 to 3 Hz sharply contoured delta slowing in her right frontal region. Hyperventilation and photic stimulation were not performed.   ABNORMALITY -Continuous rhythmic delta slow, right frontal region IMPRESSION: This study is suggestive of cortical dysfunction in the right frontal region likely secondary to underlying mass.  Additionally, rhythmic delta slowing in frontal region is on the ictal-interictal continuum with low potential for seizures.  No seizures or definite epileptiform discharges were seen throughout the recording. Priyanka Barbra Sarks   CT CHEST ABDOMEN PELVIS WO CONTRAST  Result Date: 05/25/2021 CLINICAL DATA:  75 year old female with evaluation for metastatic disease. EXAM: CT CHEST, ABDOMEN AND PELVIS WITHOUT CONTRAST TECHNIQUE: Multidetector CT imaging of the chest, abdomen and pelvis was performed following the standard protocol without IV contrast. COMPARISON:  None. FINDINGS: Evaluation of this exam is limited in the absence of intravenous contrast. CT CHEST FINDINGS Cardiovascular: There is no cardiomegaly or pericardial effusion. Three-vessel coronary vascular calcification. Advanced atherosclerotic  calcification of the thoracic aorta. No aneurysmal dilatation. The central pulmonary arteries are grossly unremarkable. Mediastinum/Nodes: No hilar or mediastinal adenopathy. The esophagus is grossly unremarkable. Enlarged thyroid gland with multiple nodules, likely multinodular goiter. No mediastinal fluid collection. Lungs/Pleura: Background of emphysema. There is a 1.7 x 1.0 cm left upper lobe subpleural nodule with irregular margins suspicious for a neoplastic process (32/7). Correlation with history of known malignancy and/or multidisciplinary consult and further evaluation with PET-CT is recommended. There is a 7 mm nodule with irregular margins in the left upper lobe (21/7). Apparent small nodular densities in the right upper lobe likely confluence of interstitial markings. There is however a 3 mm right upper lobe nodule (30/7). No lobar consolidation, pleural effusion, or pneumothorax. The central airways are patent. Musculoskeletal: Old healed right posterior rib fractures. No acute osseous pathology. Osteopenia with degenerative changes of the spine. CT ABDOMEN PELVIS FINDINGS No intra-abdominal free air or free fluid. Hepatobiliary: The liver is unremarkable. No intrahepatic biliary dilatation. The gallbladder is unremarkable. Pancreas: Unremarkable. No pancreatic ductal dilatation or surrounding inflammatory changes. Spleen: Normal in size without focal abnormality. Adrenals/Urinary Tract: The adrenal glands are unremarkable. There is a 2 mm nonobstructing right renal upper pole calculus. No hydronephrosis. The left kidney is unremarkable. The visualized ureters and urinary bladder are unremarkable. Stomach/Bowel: There is severe sigmoid diverticulosis without active inflammatory changes. There is no bowel obstruction or active inflammation. The appendix is normal. Vascular/Lymphatic: Advanced aortoiliac atherosclerotic disease. The IVC is unremarkable. No  portal venous gas. There is no adenopathy.  Reproductive: The uterus and ovaries are grossly unremarkable. No adnexal masses. Other: None Musculoskeletal: Osteopenia with degenerative changes of the spine. No acute osseous pathology. IMPRESSION: 1. Left upper lobe 1.7 x 1.0 cm subpleural nodule suspicious for a neoplastic process. Additional smaller pulmonary nodules as above. Correlation with history of known malignancy or multidisciplinary consult and further evaluation with PET-CT is recommended. 2. No evidence of metastatic disease in the abdomen and pelvis. 3. Severe sigmoid diverticulosis. No bowel obstruction. Normal appendix. 4. Multinodular goiter. 5. Aortic Atherosclerosis (ICD10-I70.0) and Emphysema (ICD10-J43.9). Electronically Signed   By: Anner Crete M.D.   On: 05/25/2021 23:53   CT STEALTH HEAD W/O CONTRAST  Result Date: 06/03/2021 CLINICAL DATA:  Intracranial mass lesion for surgical planning. EXAM: CT HEAD WITHOUT CONTRAST TECHNIQUE: Contiguous axial images were obtained from the base of the skull through the vertex without intravenous contrast. COMPARISON:  MRI 05/25/2021 FINDINGS: Brain: Approximately 3 cm in diameter intra-axial mass of the inferior right frontal lobe is redemonstrated, with regional vasogenic edema and right-to-left shift of approximately 6 mm. No hemorrhagic component. No second lesion is seen. No hydrocephalus. Evidence of ischemic changes. Vascular: There is atherosclerotic calcification of the major vessels at the base of the brain. Skull: Negative Sinuses/Orbits: Clear/normal Other: None IMPRESSION: 3 cm intra-axial mass in the inferior right frontal lobe with regional vasogenic edema and right-to-left shift of 6 mm. Scan done for surgical planning/guidance. Electronically Signed   By: Nelson Chimes M.D.   On: 06/03/2021 13:30    ASSESSMENT AND PLAN: 1.  Non-small cell lung cancer -CT chest/abdomen/pelvis without contrast 05/25/2021-left upper lobe 1.7 x 1.0 cm subpleural nodule suspicious for neoplastic  process 2.  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 -Right frontal brain biopsy from 06/04/2021 -malignant neoplasm consistent with metastatic carcinoma, cytokeratin 7, TTF-1, and CDX2 (weak patchy) positive, consistent with metastatic adenocarcinoma the lung 3.  Focal seizures 4.  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  AKI 6.  COPD with chronic hypoxic respiratory failure 7.  CAD 8.  Hyperlipidemia 9.  History of MI 10.  Depression 11.  Anxiety 12.  History of tobacco dependence 13.  Normocytic anemia 14.  Candida in skin folds  Barbara Gibson continues to recover from the brain resection procedure.  I discussed the pathology findings with her.  She has been diagnosed with metastatic non-small cell lung cancer.  The clinical history and imaging to date are consistent with a low tumor burden.  We made a referral to radiation oncology for postoperative brain radiation.  We will submit the tumor for molecular testing and refer her for a staging PET scan prior to deciding on systemic treatment options.  She is unsure whether she will receive cancer treatment in [place] or [place].  She would like me to discuss this with her daughter.  I will contact her today.  Recommendations: 1.  Outpatient staging PET scan 2.  Submit brain pathology for molecular PD1 testing 3.  Outpatient follow-up will be scheduled at the cancer here or in [place] depending on the patient/family preference.    LOS: 14 days   Betsy Coder, MD  06/09/21  I discussed the case with her daughter, Barbara Gibson.  She will meet with other family  members to decide on oncology follow-up in [place] or at [facility].  We will contact Barbara Gibson on 06/10/2021 to arrange for outpatient follow-up.

## 2021-06-10 ENCOUNTER — Encounter: Payer: Self-pay | Admitting: Adult Health

## 2021-06-10 ENCOUNTER — Non-Acute Institutional Stay (SKILLED_NURSING_FACILITY): Payer: Medicare Other | Admitting: Adult Health

## 2021-06-10 ENCOUNTER — Telehealth: Payer: Self-pay | Admitting: *Deleted

## 2021-06-10 ENCOUNTER — Inpatient Hospital Stay
Admission: RE | Admit: 2021-06-10 | Discharge: 2021-06-17 | Disposition: A | Discharge status: Nursing Home (Includes ICF) | Payer: Medicare Other | Source: Ambulatory Visit | Attending: Internal Medicine | Admitting: Internal Medicine

## 2021-06-10 DIAGNOSIS — I10 Essential (primary) hypertension: Secondary | ICD-10-CM | POA: Insufficient documentation

## 2021-06-10 DIAGNOSIS — C7931 Secondary malignant neoplasm of brain: Secondary | ICD-10-CM | POA: Diagnosis not present

## 2021-06-10 DIAGNOSIS — I700 Atherosclerosis of aorta: Secondary | ICD-10-CM | POA: Diagnosis not present

## 2021-06-10 DIAGNOSIS — K5909 Other constipation: Secondary | ICD-10-CM | POA: Diagnosis not present

## 2021-06-10 DIAGNOSIS — Z87891 Personal history of nicotine dependence: Secondary | ICD-10-CM | POA: Insufficient documentation

## 2021-06-10 DIAGNOSIS — J431 Panlobular emphysema: Secondary | ICD-10-CM

## 2021-06-10 DIAGNOSIS — E785 Hyperlipidemia, unspecified: Secondary | ICD-10-CM | POA: Diagnosis not present

## 2021-06-10 DIAGNOSIS — I2510 Atherosclerotic heart disease of native coronary artery without angina pectoris: Secondary | ICD-10-CM | POA: Diagnosis not present

## 2021-06-10 DIAGNOSIS — M810 Age-related osteoporosis without current pathological fracture: Secondary | ICD-10-CM

## 2021-06-10 DIAGNOSIS — Z853 Personal history of malignant neoplasm of breast: Secondary | ICD-10-CM | POA: Diagnosis not present

## 2021-06-10 DIAGNOSIS — J9611 Chronic respiratory failure with hypoxia: Secondary | ICD-10-CM

## 2021-06-10 DIAGNOSIS — F339 Major depressive disorder, recurrent, unspecified: Secondary | ICD-10-CM | POA: Insufficient documentation

## 2021-06-10 DIAGNOSIS — K219 Gastro-esophageal reflux disease without esophagitis: Secondary | ICD-10-CM

## 2021-06-10 DIAGNOSIS — R569 Unspecified convulsions: Secondary | ICD-10-CM

## 2021-06-10 DIAGNOSIS — R911 Solitary pulmonary nodule: Secondary | ICD-10-CM

## 2021-06-10 DIAGNOSIS — E43 Unspecified severe protein-calorie malnutrition: Secondary | ICD-10-CM

## 2021-06-10 DIAGNOSIS — J9612 Chronic respiratory failure with hypercapnia: Secondary | ICD-10-CM

## 2021-06-10 NOTE — Telephone Encounter (Signed)
Barbara Gibson has new diagnosis of lung cancer and is s/p resection of brain metastasis. Has been discharged to [facility] in [place], [place] Per [name], needs PET scan (initial skull base to thigh). Need to determine where family wishes to have her seen, followed and PET scan completed Barbara Gibson or here). Needs Foundation 1 testing with PDL-1 on brain tumor path. Called daughter, Barbara Gibson: family will have discussion this evening and she will call w/their decision. She wishes to hold off on PET scan appointment due to difficulty getting in/out of car (facility does not provide transportation). Provided direct # for her to call w/decision. Has appointment in 2 weeks to have staples removed.

## 2021-06-10 NOTE — Progress Notes (Signed)
Location:  [place] Room Number: 160 Place of Service:  SNF (31)   CODE STATUS: full code   Allergies  Allergen Reactions   Sulfa Antibiotics Nausea And Vomiting    Chief Complaint  Patient presents with   Hospitalization Follow-up    HPI:  She is a 75 year old woman who has been 05-25-21 through 06-09-21. Her medical history includes: hyperlipidemia; emphysema; hypertension; anxiety. She had gotten weaker at home for about 2 weeks prior to her hospitalization with headaches. She presented to the ED. She was found to have a brain tumor. She was seen by neurosurgery and underwent a right frontal brain mass on 06-04-21. She has a history of left breast cancer: 02/2016 with lumpectomy radiation therapy deferred due to her co morbidities. She has completed letrazole in May 2022. She does have pulmonary nodules. 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 She will be followed by Dr. Learta Codding. She was treated for a UTI with rocephin. She does have osteoporosis and is presently not on fosamax. She is on chronic 02 due to her panlobular emphysema and chronic respiratory failure with hypoxia and hypercapnia.   She is here for short term rehab. She tells me that she plans to go home with her sister upon discharge. She does have a headache which throbs at times. We did talk about using her pain medication and she has verbalized understanding. She denies any constipation; no insomnia. She will continue to be followed for her chronic illnesses including: Panlobular emphysema/chronic respiratory failure with hypoxia and hypercapnia: Coronary artery disease involving native coronary artery without angina pectoris:    Aortic atherosclerosis:  Age related osteoporosis without current pathological fracture   Past Medical History:  Diagnosis Date   Anxiety    Breast cancer ([place])    COPD (chronic obstructive pulmonary disease) ([place])    Depression    High  cholesterol    Hyperlipidemia    Myocardial infarction ([place]) 10/22/2014   "mild"   On home oxygen therapy    "2L; 24/7" (10/24/2014)   Pneumonia 04/28/2013    Past Surgical History:  Procedure Laterality Date   APPLICATION OF CRANIAL NAVIGATION N/A 06/04/2021   Procedure: APPLICATION OF CRANIAL NAVIGATION;  Surgeon: Karsten Ro, DO;  Location: [facility];  Service: Neurosurgery;  Laterality: N/A;   CARDIAC CATHETERIZATION  10/24/2014   CRANIOTOMY Right 06/04/2021   Procedure: RIGHT FRONTAL CRANIOTOMY FOR RESECTION OF TUMOR;  Surgeon: Karsten Ro, DO;  Location: [facility];  Service: Neurosurgery;  Laterality: Right;   LEFT HEART CATHETERIZATION WITH CORONARY ANGIOGRAM N/A 10/24/2014   Procedure: LEFT HEART CATHETERIZATION WITH CORONARY ANGIOGRAM;  Surgeon: Laverda Page, MD;  Location: [facility] CATH LAB;  Service: Cardiovascular;  Laterality: N/A;   TUBAL LIGATION  1980's    Social History   Socioeconomic History   Marital status: Widowed    Spouse name: Not on file   Number of children: Not on file   Years of education: Not on file   Highest education level: Not on file  Occupational History   Not on file  Tobacco Use   Smoking status: Former    Packs/day: 1.50    Years: 52.00    Pack years: 78.00    Types: Cigarettes    Quit date: 12/23/2012    Years since quitting: 8.4   Smokeless tobacco: Never  Substance and Sexual Activity   Alcohol use: No    Alcohol/week: 0.0 standard drinks  Drug use: No   Sexual activity: Never  Other Topics Concern   Not on file  Social History Narrative   Not on file   Social Determinants of Health   Financial Resource Strain: Not on file  Food Insecurity: Not on file  Transportation Needs: Not on file  Physical Activity: Not on file  Stress: Not on file  Social Connections: Not on file  Intimate Partner Violence: Not on file   No family history on file.    VITAL SIGNS BP (!) 126/52   Pulse 72   Temp 98.2 F (36.8 C)   Resp 18    Ht 5\' 6"  (1.676 m)   Wt 152 lb (68.9 kg)   SpO2 96%   BMI 24.53 kg/m   Outpatient Encounter Medications as of 06/10/2021  Medication Sig   acetaminophen (TYLENOL) 325 MG tablet Take 2 tablets (650 mg total) by mouth every 6 (six) hours as needed for mild pain (or Fever >/= 101).   acetaZOLAMIDE (DIAMOX) 250 MG tablet Take 250 mg by mouth 2 (two) times daily.   ALPRAZolam (XANAX) 1 MG tablet Take 1 tablet (1 mg total) by mouth 2 (two) times daily as needed for anxiety.   amLODipine (NORVASC) 5 MG tablet Take 1 tablet (5 mg total) by mouth daily.   ANORO ELLIPTA 62.5-25 MCG/INH AEPB Inhale 1 puff into the lungs daily.   benazepril (LOTENSIN) 5 MG tablet Take 5 mg by mouth daily.   buPROPion (WELLBUTRIN XL) 150 MG 24 hr tablet Take 150 mg by mouth daily.   Cholecalciferol 50 MCG (2000 UT) CAPS Take 1 capsule by mouth daily.   citalopram (CELEXA) 20 MG tablet Take 20 mg by mouth daily.   clopidogrel (PLAVIX) 75 MG tablet Take 1 tablet (75 mg total) by mouth daily.   dexamethasone (DECADRON) 2 MG tablet Take 1 tablet (2 mg total) by mouth 2 (two) times daily.   feeding supplement (ENSURE ENLIVE / ENSURE PLUS) LIQD Take 237 mLs by mouth 3 (three) times daily between meals.   HYDROcodone-acetaminophen (NORCO/VICODIN) 5-325 MG tablet Take 1 tablet by mouth every 4 (four) hours as needed for moderate pain.   levETIRAcetam (KEPPRA) 250 MG tablet Take 1 tablet (250 mg total) by mouth 2 (two) times daily.   metoprolol tartrate (LOPRESSOR) 25 MG tablet Take 0.5 tablets (12.5 mg total) by mouth 2 (two) times daily.   Multiple Vitamin (MULTIVITAMIN WITH MINERALS) TABS tablet Take 1 tablet by mouth daily.   ondansetron (ZOFRAN) 4 MG tablet Take 1 tablet (4 mg total) by mouth every 6 (six) hours as needed for nausea.   pantoprazole (PROTONIX) 40 MG tablet Take 1 tablet (40 mg total) by mouth daily.   polyethylene glycol (MIRALAX / GLYCOLAX) 17 g packet Take 17 g by mouth daily.   Probiotic Product  (PROBIOTIC PO) Take 1 tablet by mouth in the morning and at bedtime.   senna-docusate (SENOKOT-S) 8.6-50 MG tablet Take 1 tablet by mouth at bedtime as needed for mild constipation.   simvastatin (ZOCOR) 20 MG tablet Take 20 mg by mouth daily.   [DISCONTINUED] nystatin (MYCOSTATIN/NYSTOP) powder Apply 1 application topically 2 (two) times daily.   No facility-administered encounter medications on file as of 06/10/2021.     SIGNIFICANT DIAGNOSTIC EXAMS  TODAY  05-25-21: MRI of brain:   2.7 cm anterior inferior right frontal lobe mass with extensive surrounding edema. Metastasis is the primary differential consideration. There is 1 cm of leftward midline shift. No hydrocephalus.  2   Postcontrast imaging is recommended to evaluate for additional lesions.  05-25-21: chest x-ray:  Low lung volumes with mild bibasilar atelectasis. Mild left base infiltrate cannot be excluded  05-25-21: ct of chest abdomen and pelvis:  1. Left upper lobe 1.7 x 1.0 cm subpleural nodule suspicious for a neoplastic process. Additional smaller pulmonary nodules as above. 2. No evidence of metastatic disease in the abdomen and pelvis. 3. Severe sigmoid diverticulosis. No bowel obstruction. Normal appendix. 4. Multinodular goiter. 5. Aortic Atherosclerosis  and Emphysema   05-26-21: EEG:  This study is suggestive of cortical dysfunction in the right frontal region likely secondary to underlying mass.  Additionally, rhythmic delta slowing in frontal region is on the ictal-interictal continuum with low potential for seizures.  No seizures or definite epileptiform discharges were seen throughout the recording.    LABS REVIEWED TODAY  05-25-21; wbc 8.8; hgb 13.7; hct 44.2; mcv 95.5 plt 282; glucose 122; bun 39; creat 1.76; k+ 4.4; na++ 135; ca 9.2; GFR 30; liver normal albumin 4.2 05-30-21: wbc 6.9; hgb 11.7; hct 38.4; mcv 994.1 plt 230; glucose 149; bun 18; creat 0.68; k+ 4.8; na++ 139; ca 9.5; GFR >60 06-03-21: glucose  188; bun 23; creat 0.66; k+ 5.5; na++ 140; ca 9.5; GFR >60 06-09-21: wbc 9.1; hgb 10.9; hct 35.5; mcv 94.9 plt 185; glucose 277; bun 29; creat 0.71; k+ 4.2; na++ 138; ca 9.3 GFR >60; liver normal albumin 2.9; mag 1.9; phos 2.7    Review of Systems  Constitutional:  Positive for malaise/fatigue.  Eyes:  Negative for blurred vision.  Respiratory:  Negative for cough and shortness of breath.   Cardiovascular:  Negative for chest pain, palpitations and leg swelling.  Gastrointestinal:  Negative for abdominal pain, constipation and heartburn.  Musculoskeletal:  Negative for back pain, joint pain and myalgias.  Skin: Negative.   Neurological:  Positive for headaches. Negative for dizziness.  Psychiatric/Behavioral:  The patient is not nervous/anxious.      Physical Exam Constitutional:      General: She is not in acute distress.    Appearance: She is well-developed. She is not diaphoretic.  Neck:     Thyroid: No thyromegaly.  Cardiovascular:     Rate and Rhythm: Normal rate and regular rhythm.     Pulses: Normal pulses.     Heart sounds: Normal heart sounds.  Pulmonary:     Effort: Pulmonary effort is normal. No respiratory distress.     Breath sounds: Normal breath sounds.     Comments: 02 dependent  Abdominal:     General: Bowel sounds are normal. There is no distension.     Palpations: Abdomen is soft.     Tenderness: There is no abdominal tenderness.  Musculoskeletal:        General: Normal range of motion.     Cervical back: Neck supple.     Right lower leg: No edema.     Left lower leg: No edema.  Lymphadenopathy:     Cervical: No cervical adenopathy.  Skin:    General: Skin is warm and dry.     Comments: Staples around forehead intact without signs of infection present Has bruising on bilateral arms.  Has herpes rash on left buttock  Neurological:     Mental Status: She is alert and oriented to person, place, and time.  Psychiatric:        Mood and Affect: Mood  normal.       ASSESSMENT/ PLAN:  TODAY  Secondary  cancer of the brain : is status post right frontal resection: adenocarcinoma of lung origin. Is followed by neurosurgery, is have oncology follow up and PET scan on an outpatient basis. Will continue diamox 250 mg twice daily; decadron 2 mg twice daily neurosurgery is weaning. Will continue vicodin 5/325 mg every 6 hours as needed   2. History of left breast cancer: is status post lumpectomy without radiation therapy due to co morbidities. Has completed letrazole in May 2022.   3. Panlobular emphysema/chronic respiratory failure with hypoxia and hypercapnia:history of 78 pack year smoking  is on chronic 02. Will continue anorao 62.5/25 mcg daily   4. Coronary artery disease involving native coronary artery without angina pectoris: is stable will continue plavix 75 mg daily   5. Aortic atherosclerosis: (ct 05-25-21) will monitor   6. Age related osteoporosis without current pathological fracture: is not on fosamax at this time  7. Hyperlipidemia LDL goal <100: is stable will continue zocor 20 mg daily   8. Essential hypertension: is stable 126/52: will continue lopressor 12.5 mg twice daily norvasc 5 mg daily lisinopril 5 mg daily   9. Chronic constipation: is stable will continue miralax daily and senna daily as needed  10. GERD without esophagitis: is stable will continue protonix 40 mg daily   11. Major depression recurrent chronic: is stable will continue celexa 20 mg daily has xanax 1 mg twice daily as needed; will continue wellbutrin xl 150 mg daily; if she does have a seizure will need to stop this medication  12. Seizure: she has not had a known seizure is on keppra 250 mg twice daily   13. Protein calorie malnutrition severe: albumin is 2.9; will continue supplements as directed and add prostat 30 cc with meals.   14. Left upper lobe pulmonary nodule: 1.7 x 1 cm suspicious for neoplastic   15. Herpes: will begin valtrex 1  gm three times daily for one week.   Will check cbc; cmp mag phos hgb a1c        Ok Edwards NP [facility]  Contact [phone] Monday through Friday 8am- 5pm  After hours call [phone]

## 2021-06-11 ENCOUNTER — Encounter: Payer: Self-pay | Admitting: Internal Medicine

## 2021-06-11 ENCOUNTER — Non-Acute Institutional Stay (SKILLED_NURSING_FACILITY): Payer: Medicare Other | Admitting: Internal Medicine

## 2021-06-11 ENCOUNTER — Telehealth: Payer: Self-pay | Admitting: *Deleted

## 2021-06-11 DIAGNOSIS — N390 Urinary tract infection, site not specified: Secondary | ICD-10-CM

## 2021-06-11 DIAGNOSIS — Z22322 Carrier or suspected carrier of Methicillin resistant Staphylococcus aureus: Secondary | ICD-10-CM

## 2021-06-11 DIAGNOSIS — R911 Solitary pulmonary nodule: Secondary | ICD-10-CM | POA: Diagnosis not present

## 2021-06-11 DIAGNOSIS — G9340 Encephalopathy, unspecified: Secondary | ICD-10-CM | POA: Diagnosis not present

## 2021-06-11 DIAGNOSIS — N179 Acute kidney failure, unspecified: Secondary | ICD-10-CM

## 2021-06-11 DIAGNOSIS — C7931 Secondary malignant neoplasm of brain: Secondary | ICD-10-CM

## 2021-06-11 NOTE — Assessment & Plan Note (Addendum)
CKD Stage 2 Medication List reviewed;if CKD progresses ACE-I  will be discontinued.

## 2021-06-11 NOTE — Patient Instructions (Signed)
See assessment and plan under each diagnosis in the problem list and acutely for this visit 

## 2021-06-11 NOTE — Progress Notes (Signed)
NURSING HOME LOCATION: [facility] ROOM NUMBER:  [id number]  CODE STATUS: Full code  PCP:  Velvet Bathe A. Hasanaj MD  This is a comprehensive admission note to this SNFperformed on this date less than 30 days from date of admission. Included are preadmission medical/surgical history; reconciled medication list; family history; social history and comprehensive review of systems.  Corrections and additions to the records were documented. Comprehensive physical exam was also performed. Additionally a clinical summary was entered for each active diagnosis pertinent to this admission in the Problem List to enhance continuity of care.  HPI: Patient was hospitalized 6/28 - 06/09/2021 presenting with generalized weakness for 3 days.  Imaging revealed a brain mass; Neurosurgery recommended holding Plavix for up to 7 days prior to resection of the mass.   Course was complicated by metabolic encephalopathy in the setting of possible UTI ( based on UA; no C&S in Epic) for which she received ceftriaxone 6/28-7/1. Preop nasal swab was MRSA positive. EEG on 629 suggested "cortical dysfunction of the right frontal region likely secondary to the underlying mass".  Also rhythmic delta slowing in the frontal region suggested low potential for seizures.  Keppra was initiated 250 mg twice daily as seizure prophylaxis. Resection was completed 7/8 and Decadron initiated to prevent intracerebral edema. Postop she was in the ICU under the neurosurgical service's care. AKI was present with creatinine of 1.76 on 6/28.  With rehydration this improved to a value of 0.71 with GFR > 60. Leukocytosis was present with white count of 13.2; this was attributed to the steroids.Clinically no infectious process beyond the UTI was present.  Initial pathology suggested metastatic cancer; special stains suggested adenocarcinoma of the lung as the origin.  Chest x-ray demonstrated left upper lobe pulmonary nodule. Oncology  recommended outpatient follow-up with PET scan and outpatient radiation postresection. Diamox was continued as the Decadron was weaned.  Plavix could be resumed 7/14 as per Neurosurgery. She she was discharged to the SNF for rehab.  Past medical and surgical history: Includes a history of breast cancer, oxygen dependent COPD, history of depression, dyslipidemia, history of MI, and essential hypertension. Surgeries and procedures include breast lumpectomy and cardiac catheterization.  Social history: Nondrinker; she has a 78-pack-year history of smoking.  Family history: Noncontributory due to age.   Review of systems: She describes a recent weight loss of 10 pounds in the context of anorexia.  She also has early satiety.  In the last 24 hours she has had some dyspepsia. Her major complaint is that she is "out of sorts" expressing that she is "aggravated with myself".  She intimates that this improves when she takes a Xanax.  She also describes light sensitivity. Dyspnea is chronic but stable. She has weakness in the right lower extremity which inhibits her ability to mobilize. She denies active GU symptoms.  Constitutional: No fever Eyes: No redness, discharge, pain, vision change ENT/mouth: No nasal congestion, purulent discharge, earache, change in hearing, sore throat  Cardiovascular: No chest pain, palpitations, paroxysmal nocturnal dyspnea, claudication, edema  Respiratory: No  hemoptysis, significant snoring, apnea Gastrointestinal: No dysphagia, abdominal pain, nausea /vomiting, rectal bleeding, melena, change in bowels Genitourinary: No dysuria, hematuria, pyuria, incontinence, nocturia Musculoskeletal: No joint stiffness, joint swelling Dermatologic: No rash, pruritus, change in appearance of skin Neurologic: No dizziness, headache, syncope, seizures, numbness, tingling Psychiatric: No  insomnia Endocrine: No change in hair/skin/nails, excessive thirst, excessive hunger, excessive  urination  Hematologic/lymphatic: No significant bruising, lymphadenopathy, abnormal bleeding Allergy/immunology: No itchy/watery  eyes, significant sneezing, urticaria, angioedema  Physical exam:  Pertinent or positive findings: Voice is somewhat weak.  She has extensive metal suturing across the upper forehead, greater on the right side.  Eyebrows are decreased.  She has an upper plate and lower partial.  The right lobe of the thyroid is palpable whereas the left is clinically small.  Breath sounds are markedly decreased.  Pedal pulses are decreased.  Extremities are cool to touch.  She is generally weak especially the right lower extremity.  There is extensive bruising over the forearms & hands.  General appearance: no acute distress, increased work of breathing is present.   Lymphatic: No lymphadenopathy about the head, neck, axilla. Eyes: No conjunctival inflammation or lid edema is present. There is no scleral icterus. Ears:  External ear exam shows no significant lesions or deformities.   Nose:  External nasal examination shows no deformity or inflammation. Nasal mucosa are pink and moist without lesions, exudates Oral exam: There is no oropharyngeal erythema or exudate. Neck:  No masses, tenderness noted.    Heart:  Normal rate and regular rhythm. S1 and S2 normal without gallop, murmur, click, rub.  Lungs:  without wheezes, rhonchi, rales, rubs. Abdomen: Bowel sounds are normal.  Abdomen is soft and nontender with no organomegaly, hernias, masses. GU: Deferred  Extremities:  No cyanosis, clubbing, edema. Neurologic exam: Balance, Rhomberg, finger to nose testing could not be completed due to clinical state Skin: Warm & dry w/o tenting. No significant rash.  See clinical summary under each active problem in the Problem List with associated updated therapeutic plan

## 2021-06-11 NOTE — Telephone Encounter (Signed)
Spoke w/daughter, Lynnae January and she reports family has decided to have oncology care with Dr. Placido Sou at [facility] and radiation at [facility]. Notified referral scheduler and scheduler, Amy at [facility] of referral and need for PET and [facility] testing on her brain tissue. She is currently a resident at [facility] and confirmed that they do provide transportation.

## 2021-06-11 NOTE — Assessment & Plan Note (Signed)
Oncology follow-up 7/18 with outpatient PET scan and radiation planned.

## 2021-06-12 DIAGNOSIS — Z22322 Carrier or suspected carrier of Methicillin resistant Staphylococcus aureus: Secondary | ICD-10-CM | POA: Insufficient documentation

## 2021-06-12 NOTE — Assessment & Plan Note (Signed)
Oncology plans PET scan post D/C from SNF

## 2021-06-12 NOTE — Assessment & Plan Note (Signed)
Clinically resolved @ present.

## 2021-06-12 NOTE — Assessment & Plan Note (Signed)
No active GU symptoms @ present

## 2021-06-14 ENCOUNTER — Inpatient Hospital Stay: Payer: Medicare Other | Attending: Neurological Surgery

## 2021-06-15 ENCOUNTER — Other Ambulatory Visit: Payer: Self-pay | Admitting: *Deleted

## 2021-06-15 NOTE — Patient Outreach (Signed)
Member screened for potential [facility] Care Management needs. Mrs. Pfannenstiel resides in [place] [facility].  Update received from [place] reporting transition plan is for member to live with sister upon SNF dc. Reports daughter Arby Barrette has been primary contact. States daughter wants to take member home as soon as possible. SNF SW states member could benefit from [place] Management follow up.   Will plan outreach to discuss [place] Management services.    Marthenia Rolling, MSN, RN,BSN [place] Acute Care Coordinator [phone] [facility]) [phone]  (Toll free office)

## 2021-06-16 ENCOUNTER — Other Ambulatory Visit: Payer: Self-pay | Admitting: Radiation Therapy

## 2021-06-17 ENCOUNTER — Encounter (HOSPITAL_COMMUNITY): Payer: Self-pay

## 2021-06-17 ENCOUNTER — Other Ambulatory Visit: Payer: Self-pay

## 2021-06-17 ENCOUNTER — Non-Acute Institutional Stay (SKILLED_NURSING_FACILITY): Payer: Medicare Other | Admitting: Adult Health

## 2021-06-17 ENCOUNTER — Emergency Department (HOSPITAL_COMMUNITY): Payer: Medicare Other

## 2021-06-17 ENCOUNTER — Inpatient Hospital Stay (HOSPITAL_COMMUNITY)
Admission: EM | Admit: 2021-06-17 | Discharge: 2021-06-28 | DRG: 951 | Disposition: E | Payer: Medicare Other | Source: Skilled Nursing Facility | Attending: Internal Medicine | Admitting: Internal Medicine

## 2021-06-17 ENCOUNTER — Other Ambulatory Visit (HOSPITAL_COMMUNITY)
Admission: RE | Admit: 2021-06-17 | Discharge: 2021-06-17 | Disposition: A | Discharge status: Home / Self Care | Payer: Medicare Other | Source: Skilled Nursing Facility | Attending: Adult Health | Admitting: Adult Health

## 2021-06-17 DIAGNOSIS — Z79899 Other long term (current) drug therapy: Secondary | ICD-10-CM

## 2021-06-17 DIAGNOSIS — J9602 Acute respiratory failure with hypercapnia: Secondary | ICD-10-CM

## 2021-06-17 DIAGNOSIS — R4182 Altered mental status, unspecified: Secondary | ICD-10-CM | POA: Diagnosis not present

## 2021-06-17 DIAGNOSIS — I252 Old myocardial infarction: Secondary | ICD-10-CM

## 2021-06-17 DIAGNOSIS — Z9981 Dependence on supplemental oxygen: Secondary | ICD-10-CM | POA: Diagnosis not present

## 2021-06-17 DIAGNOSIS — Z87891 Personal history of nicotine dependence: Secondary | ICD-10-CM

## 2021-06-17 DIAGNOSIS — Z66 Do not resuscitate: Secondary | ICD-10-CM | POA: Diagnosis present

## 2021-06-17 DIAGNOSIS — J9622 Acute and chronic respiratory failure with hypercapnia: Secondary | ICD-10-CM

## 2021-06-17 DIAGNOSIS — J9601 Acute respiratory failure with hypoxia: Secondary | ICD-10-CM | POA: Diagnosis not present

## 2021-06-17 DIAGNOSIS — E872 Acidosis: Secondary | ICD-10-CM | POA: Diagnosis present

## 2021-06-17 DIAGNOSIS — I959 Hypotension, unspecified: Secondary | ICD-10-CM | POA: Diagnosis present

## 2021-06-17 DIAGNOSIS — F32A Depression, unspecified: Secondary | ICD-10-CM | POA: Diagnosis present

## 2021-06-17 DIAGNOSIS — K219 Gastro-esophageal reflux disease without esophagitis: Secondary | ICD-10-CM | POA: Diagnosis present

## 2021-06-17 DIAGNOSIS — N1832 Chronic kidney disease, stage 3b: Secondary | ICD-10-CM | POA: Diagnosis present

## 2021-06-17 DIAGNOSIS — R680 Hypothermia, not associated with low environmental temperature: Secondary | ICD-10-CM | POA: Diagnosis present

## 2021-06-17 DIAGNOSIS — J439 Emphysema, unspecified: Secondary | ICD-10-CM | POA: Diagnosis present

## 2021-06-17 DIAGNOSIS — N179 Acute kidney failure, unspecified: Secondary | ICD-10-CM

## 2021-06-17 DIAGNOSIS — C3490 Malignant neoplasm of unspecified part of unspecified bronchus or lung: Secondary | ICD-10-CM | POA: Diagnosis present

## 2021-06-17 DIAGNOSIS — J431 Panlobular emphysema: Secondary | ICD-10-CM

## 2021-06-17 DIAGNOSIS — Z853 Personal history of malignant neoplasm of breast: Secondary | ICD-10-CM

## 2021-06-17 DIAGNOSIS — Z515 Encounter for palliative care: Secondary | ICD-10-CM | POA: Diagnosis not present

## 2021-06-17 DIAGNOSIS — G936 Cerebral edema: Secondary | ICD-10-CM | POA: Diagnosis present

## 2021-06-17 DIAGNOSIS — Z7951 Long term (current) use of inhaled steroids: Secondary | ICD-10-CM

## 2021-06-17 DIAGNOSIS — C7931 Secondary malignant neoplasm of brain: Secondary | ICD-10-CM | POA: Diagnosis present

## 2021-06-17 DIAGNOSIS — R0602 Shortness of breath: Secondary | ICD-10-CM | POA: Diagnosis not present

## 2021-06-17 DIAGNOSIS — I129 Hypertensive chronic kidney disease with stage 1 through stage 4 chronic kidney disease, or unspecified chronic kidney disease: Secondary | ICD-10-CM | POA: Diagnosis present

## 2021-06-17 DIAGNOSIS — J9621 Acute and chronic respiratory failure with hypoxia: Secondary | ICD-10-CM | POA: Diagnosis present

## 2021-06-17 DIAGNOSIS — Z20822 Contact with and (suspected) exposure to covid-19: Secondary | ICD-10-CM | POA: Diagnosis present

## 2021-06-17 DIAGNOSIS — R911 Solitary pulmonary nodule: Secondary | ICD-10-CM | POA: Diagnosis not present

## 2021-06-17 DIAGNOSIS — E7800 Pure hypercholesterolemia, unspecified: Secondary | ICD-10-CM | POA: Diagnosis present

## 2021-06-17 DIAGNOSIS — Z882 Allergy status to sulfonamides status: Secondary | ICD-10-CM

## 2021-06-17 DIAGNOSIS — M810 Age-related osteoporosis without current pathological fracture: Secondary | ICD-10-CM | POA: Diagnosis present

## 2021-06-17 DIAGNOSIS — R6510 Systemic inflammatory response syndrome (SIRS) of non-infectious origin without acute organ dysfunction: Secondary | ICD-10-CM | POA: Diagnosis present

## 2021-06-17 DIAGNOSIS — I2510 Atherosclerotic heart disease of native coronary artery without angina pectoris: Secondary | ICD-10-CM | POA: Diagnosis present

## 2021-06-17 DIAGNOSIS — F419 Anxiety disorder, unspecified: Secondary | ICD-10-CM | POA: Diagnosis present

## 2021-06-17 DIAGNOSIS — G9341 Metabolic encephalopathy: Secondary | ICD-10-CM | POA: Diagnosis present

## 2021-06-17 DIAGNOSIS — Z7902 Long term (current) use of antithrombotics/antiplatelets: Secondary | ICD-10-CM

## 2021-06-17 LAB — CBC WITH DIFFERENTIAL/PLATELET
Abs Immature Granulocytes: 0.09 10*3/uL — ABNORMAL HIGH (ref 0.00–0.07)
Abs Immature Granulocytes: 0.1 10*3/uL — ABNORMAL HIGH (ref 0.00–0.07)
Basophils Absolute: 0 10*3/uL (ref 0.0–0.1)
Basophils Absolute: 0 10*3/uL (ref 0.0–0.1)
Basophils Relative: 0 %
Basophils Relative: 0 %
Eosinophils Absolute: 0.1 10*3/uL (ref 0.0–0.5)
Eosinophils Absolute: 0 10*3/uL (ref 0.0–0.5)
Eosinophils Relative: 1 %
Eosinophils Relative: 0 %
HCT: 34.8 % — ABNORMAL LOW (ref 36.0–46.0)
HCT: 35.8 % — ABNORMAL LOW (ref 36.0–46.0)
Hemoglobin: 10.3 g/dL — ABNORMAL LOW (ref 12.0–15.0)
Hemoglobin: 10.6 g/dL — ABNORMAL LOW (ref 12.0–15.0)
Immature Granulocytes: 1 %
Immature Granulocytes: 1 %
Lymphocytes Relative: 5 %
Lymphocytes Relative: 4 %
Lymphs Abs: 0.6 10*3/uL — ABNORMAL LOW (ref 0.7–4.0)
Lymphs Abs: 0.6 10*3/uL — ABNORMAL LOW (ref 0.7–4.0)
MCH: 29.2 pg (ref 26.0–34.0)
MCH: 29.4 pg (ref 26.0–34.0)
MCHC: 29.6 g/dL — ABNORMAL LOW (ref 30.0–36.0)
MCHC: 29.6 g/dL — ABNORMAL LOW (ref 30.0–36.0)
MCV: 98.6 fL (ref 80.0–100.0)
MCV: 99.4 fL (ref 80.0–100.0)
Monocytes Absolute: 0.8 10*3/uL (ref 0.1–1.0)
Monocytes Absolute: 0.5 10*3/uL (ref 0.1–1.0)
Monocytes Relative: 8 %
Monocytes Relative: 3 %
Neutro Abs: 8.9 10*3/uL — ABNORMAL HIGH (ref 1.7–7.7)
Neutro Abs: 12.2 10*3/uL — ABNORMAL HIGH (ref 1.7–7.7)
Neutrophils Relative %: 85 %
Neutrophils Relative %: 92 %
Platelets: 210 10*3/uL (ref 150–400)
Platelets: 228 10*3/uL (ref 150–400)
RBC: 3.53 MIL/uL — ABNORMAL LOW (ref 3.87–5.11)
RBC: 3.6 MIL/uL — ABNORMAL LOW (ref 3.87–5.11)
RDW: 13.3 % (ref 11.5–15.5)
RDW: 13.2 % (ref 11.5–15.5)
WBC: 10.4 10*3/uL (ref 4.0–10.5)
WBC: 13.4 10*3/uL — ABNORMAL HIGH (ref 4.0–10.5)
nRBC: 0 % (ref 0.0–0.2)
nRBC: 0 % (ref 0.0–0.2)

## 2021-06-17 LAB — BLOOD GAS, ARTERIAL
Acid-base deficit: 3.6 mmol/L — ABNORMAL HIGH (ref 0.0–2.0)
Acid-base deficit: 3.9 mmol/L — ABNORMAL HIGH (ref 0.0–2.0)
Bicarbonate: 20.2 mmol/L (ref 20.0–28.0)
Bicarbonate: 20.2 mmol/L (ref 20.0–28.0)
FIO2: 36
FIO2: 40
O2 Saturation: 98.8 %
O2 Saturation: 98.2 %
Patient temperature: 34.6
Patient temperature: 34.6
pCO2 arterial: 62.8 mmHg — ABNORMAL HIGH (ref 32.0–48.0)
pCO2 arterial: 64.7 mmHg — ABNORMAL HIGH (ref 32.0–48.0)
pH, Arterial: 7.189 — CL (ref 7.350–7.450)
pH, Arterial: 7.176 — CL (ref 7.350–7.450)
pO2, Arterial: 273 mmHg — ABNORMAL HIGH (ref 83.0–108.0)
pO2, Arterial: 120 mmHg — ABNORMAL HIGH (ref 83.0–108.0)

## 2021-06-17 LAB — COMPREHENSIVE METABOLIC PANEL
ALT: 18 U/L (ref 0–44)
ALT: 19 U/L (ref 0–44)
AST: 11 U/L — ABNORMAL LOW (ref 15–41)
AST: 13 U/L — ABNORMAL LOW (ref 15–41)
Albumin: 3.4 g/dL — ABNORMAL LOW (ref 3.5–5.0)
Albumin: 3.4 g/dL — ABNORMAL LOW (ref 3.5–5.0)
Alkaline Phosphatase: 44 U/L (ref 38–126)
Alkaline Phosphatase: 47 U/L (ref 38–126)
Anion gap: 12 (ref 5–15)
Anion gap: 13 (ref 5–15)
BUN: 88 mg/dL — ABNORMAL HIGH (ref 8–23)
BUN: 99 mg/dL — ABNORMAL HIGH (ref 8–23)
CO2: 27 mmol/L (ref 22–32)
CO2: 25 mmol/L (ref 22–32)
Calcium: 9.1 mg/dL (ref 8.9–10.3)
Calcium: 9.2 mg/dL (ref 8.9–10.3)
Chloride: 97 mmol/L — ABNORMAL LOW (ref 98–111)
Chloride: 99 mmol/L (ref 98–111)
Creatinine, Ser: 3.4 mg/dL — ABNORMAL HIGH (ref 0.44–1.00)
Creatinine, Ser: 3.91 mg/dL — ABNORMAL HIGH (ref 0.44–1.00)
GFR, Estimated: 13 mL/min — ABNORMAL LOW (ref >60)
GFR, Estimated: 11 mL/min — ABNORMAL LOW (ref >60)
Glucose, Bld: 136 mg/dL — ABNORMAL HIGH (ref 70–99)
Glucose, Bld: 212 mg/dL — ABNORMAL HIGH (ref 70–99)
Potassium: 4.6 mmol/L (ref 3.5–5.1)
Potassium: 5 mmol/L (ref 3.5–5.1)
Sodium: 136 mmol/L (ref 135–145)
Sodium: 137 mmol/L (ref 135–145)
Total Bilirubin: 0.4 mg/dL (ref 0.3–1.2)
Total Bilirubin: 0.5 mg/dL (ref 0.3–1.2)
Total Protein: 5.9 g/dL — ABNORMAL LOW (ref 6.5–8.1)
Total Protein: 6 g/dL — ABNORMAL LOW (ref 6.5–8.1)

## 2021-06-17 LAB — RESP PANEL BY RT-PCR (FLU A&B, COVID) ARPGX2
Influenza A by PCR: NEGATIVE
Influenza B by PCR: NEGATIVE
SARS Coronavirus 2 by RT PCR: NEGATIVE

## 2021-06-17 LAB — MAGNESIUM: Magnesium: 2.4 mg/dL (ref 1.7–2.4)

## 2021-06-17 LAB — LACTIC ACID, PLASMA
Lactic Acid, Venous: 1.1 mmol/L (ref 0.5–1.9)
Lactic Acid, Venous: 1.8 mmol/L (ref 0.5–1.9)

## 2021-06-17 LAB — URINALYSIS, ROUTINE W REFLEX MICROSCOPIC
Bilirubin Urine: NEGATIVE
Glucose, UA: NEGATIVE mg/dL
Hgb urine dipstick: NEGATIVE
Ketones, ur: NEGATIVE mg/dL
Nitrite: NEGATIVE
Protein, ur: >=300 mg/dL — AB
Specific Gravity, Urine: 1.016 (ref 1.005–1.030)
pH: 5 (ref 5.0–8.0)

## 2021-06-17 LAB — HEMOGLOBIN A1C
Hgb A1c MFr Bld: 6.5 % — ABNORMAL HIGH (ref 4.8–5.6)
Mean Plasma Glucose: 139.85 mg/dL

## 2021-06-17 LAB — PROTIME-INR
INR: 1 (ref 0.8–1.2)
Prothrombin Time: 12.7 seconds (ref 11.4–15.2)

## 2021-06-17 LAB — PHOSPHORUS: Phosphorus: 7.8 mg/dL — ABNORMAL HIGH (ref 2.5–4.6)

## 2021-06-17 LAB — APTT: aPTT: 24 seconds (ref 24–36)

## 2021-06-17 MED ORDER — LACTATED RINGERS IV BOLUS (SEPSIS)
1000.0000 mL | Freq: Once | INTRAVENOUS | Status: AC
  Administered 2021-06-17: 1000 mL via INTRAVENOUS

## 2021-06-17 MED ORDER — SODIUM CHLORIDE 0.9 % IV SOLN
12.5000 mg | Freq: Four times a day (QID) | INTRAVENOUS | Status: DC | PRN
  Filled 2021-06-17: qty 0.5

## 2021-06-17 MED ORDER — LORAZEPAM 2 MG/ML PO CONC: 1.0000 mg | ORAL | Status: DC | PRN

## 2021-06-17 MED ORDER — GLYCOPYRROLATE 0.2 MG/ML IJ SOLN
0.2000 mg | INTRAMUSCULAR | Status: DC | PRN
  Administered 2021-06-18 – 2021-06-19 (×5): 0.2 mg via INTRAVENOUS
  Filled 2021-06-17 (×5): qty 1

## 2021-06-17 MED ORDER — BIOTENE DRY MOUTH MT LIQD: 15.0000 mL | OROMUCOSAL | Status: DC | PRN

## 2021-06-17 MED ORDER — ACETAMINOPHEN 325 MG PO TABS: 650.0000 mg | ORAL_TABLET | Freq: Four times a day (QID) | ORAL | Status: DC | PRN

## 2021-06-17 MED ORDER — ACETAMINOPHEN 650 MG RE SUPP: 650.0000 mg | Freq: Four times a day (QID) | RECTAL | Status: DC | PRN

## 2021-06-17 MED ORDER — HALOPERIDOL LACTATE 2 MG/ML PO CONC
0.5000 mg | ORAL | Status: DC | PRN
  Filled 2021-06-17: qty 0.3

## 2021-06-17 MED ORDER — LORAZEPAM 2 MG/ML IJ SOLN
1.0000 mg | INTRAMUSCULAR | Status: DC | PRN
  Administered 2021-06-17 – 2021-06-20 (×7): 1 mg via INTRAVENOUS
  Filled 2021-06-17 (×7): qty 1

## 2021-06-17 MED ORDER — LACTATED RINGERS IV BOLUS
1000.0000 mL | Freq: Once | INTRAVENOUS | Status: AC
  Administered 2021-06-17: 1000 mL via INTRAVENOUS

## 2021-06-17 MED ORDER — LORAZEPAM 1 MG PO TABS: 1.0000 mg | ORAL_TABLET | ORAL | Status: DC | PRN

## 2021-06-17 MED ORDER — GLYCOPYRROLATE 1 MG PO TABS
1.0000 mg | ORAL_TABLET | ORAL | Status: DC | PRN
  Filled 2021-06-17: qty 1

## 2021-06-17 MED ORDER — POLYVINYL ALCOHOL 1.4 % OP SOLN: 1.0000 [drp] | Freq: Four times a day (QID) | OPHTHALMIC | Status: DC | PRN

## 2021-06-17 MED ORDER — SODIUM CHLORIDE 0.9 % IV SOLN: INTRAVENOUS | Status: DC

## 2021-06-17 MED ORDER — MORPHINE SULFATE (PF) 2 MG/ML IV SOLN
2.0000 mg | INTRAVENOUS | Status: DC | PRN
  Administered 2021-06-17 – 2021-06-20 (×14): 2 mg via INTRAVENOUS
  Filled 2021-06-17 (×14): qty 1

## 2021-06-17 MED ORDER — MORPHINE SULFATE (CONCENTRATE) 10 MG/0.5ML PO SOLN: 5.0000 mg | ORAL | Status: DC | PRN

## 2021-06-17 MED ORDER — HALOPERIDOL LACTATE 5 MG/ML IJ SOLN: 0.5000 mg | INTRAMUSCULAR | Status: DC | PRN

## 2021-06-17 MED ORDER — HALOPERIDOL 0.5 MG PO TABS: 0.5000 mg | ORAL_TABLET | ORAL | Status: DC | PRN

## 2021-06-17 MED ORDER — GLYCOPYRROLATE 0.2 MG/ML IJ SOLN: 0.2000 mg | INTRAMUSCULAR | Status: DC | PRN

## 2021-06-17 MED ORDER — IPRATROPIUM-ALBUTEROL 0.5-2.5 (3) MG/3ML IN SOLN
3.0000 mL | Freq: Four times a day (QID) | RESPIRATORY_TRACT | Status: DC
  Administered 2021-06-17: 3 mL via RESPIRATORY_TRACT
  Filled 2021-06-17: qty 3

## 2021-06-17 MED ORDER — SODIUM CHLORIDE 0.9 % IV SOLN
1.0000 g | Freq: Once | INTRAVENOUS | Status: AC
  Administered 2021-06-17: 1 g via INTRAVENOUS
  Filled 2021-06-17: qty 10

## 2021-06-17 MED ORDER — ONDANSETRON 4 MG PO TBDP: 4.0000 mg | ORAL_TABLET | Freq: Four times a day (QID) | ORAL | Status: DC | PRN

## 2021-06-17 MED ORDER — ONDANSETRON HCL 4 MG/2ML IJ SOLN: 4.0000 mg | Freq: Four times a day (QID) | INTRAMUSCULAR | Status: DC | PRN

## 2021-06-17 NOTE — Progress Notes (Signed)
RT called by RN per patient needing to go to CT. RT informed RN to place patient on nasal cannula for the duration of the CT and RT would place patient back on BIPAP upon her return.

## 2021-06-17 NOTE — H&P (Signed)
TRH H&P    Patient Demographics:    Barbara Gibson, is a 75 y.o. female  MRN: [id number]  DOB - [birth year]  Admit Date - 06/13/2021  Referring MD/NP/PA: Gilford Raid  Outpatient Primary MD for the patient is [name], MD  Patient coming from: SNF  Chief complaint- dyspnea, hypoxia, decreased appetite and altered mental status   HPI:    Barbara Gibson  is a 75 y.o. female, with medical history significant for anxiety, depression, COPD, CAD, former smoker, breast cancer s/p lumpectomy, brain mass s/p resection earlier this month presents to the ED with a chief complaint of dyspnea, hypoxia, decreased PO intake, and altered mental status. Patient is, unfortunately not able to provide any history 2/2 to altered mental status.  ED provider spoke with facility reports the patient had been doing fine up until today.  Accuracy of that statement is unclear as she has had a big jump in creatinine which is likely secondary to poor p.o. intake for several days.  She has been at the facility for about a week.  In the ED patient was found to have the significant AKI with a creatinine of 0.7 at discharge and 3.91 today.  She had CO2 retention with a PCO2 of 64.7, she is acidotic with a pH of 7.176, she was given 3 L of LR, and Rocephin.  She had had the brain resection at the last admission so CT head was repeated that showed right frontal edema and resection cavity, but no other acute findings.  These findings were discussed by ED provider with neurosurgery who reported the patient could stay here at [facility].  Patient had persistent episodes of hypotension while in the ED as well.  Boluses would improve blood pressure, but then the blood pressure would immediately go back down when bolus was finished.  At time of admission patient's blood pressure was 82/35.  I advised that patient would have to be transferred to [facility] and  would likely need ICU and had requested that the ED provider spoke with critical care at [facility].  In that time several family members came to the hospital and decided to make patient comfort measures only.  I discussed with family that we will stop BiPAP but continue nasal cannula, we will continue fluids, we will of course continue antianxiety and antipain medications.  At this time we will stop antibiotics, blood draws, and will allow patient comfort feeds if she becomes alert.  It is reported that in the ED she has had intermittent alertness.  At the time of my exam patient is somnolent, arouses to touch, but then immediately goes back to sleep.  Family understands and agrees with plan.  Chart review: Patient recently discharged from hospital on July 13 to SNF.  Patient presented to the hospital for generalized weakness that has been ongoing for 3 days.  She had a brain mass and neurosurgery was consulted.  She underwent resection on June 04, 2021 and Decadron was started.  She was transferred to the ICU and neurosurgery service  after her brain mass resection.  She was transferred back to hospitalist service after she was deemed stable by neurosurgery.  Medical oncology was planning for PET scan and postresection radiation.  Patient was discharged to the skilled nursing facility.  In the ED Temperature was initially 94.3, improved to 97.6 with warming blanket, heart rate is 25-124, respiratory rate is 14-24, blood pressure at time of admission 82/35 ABG shows pH 7.176, CO2 64.7, O2 120 CT head showed right frontal craniotomy for mass resection, right frontal edema and resection cavity Blood cultures pending Patient had a leukocytosis at 13.4, hemoglobin 10.6, platelets 228 Chemistry panel revealed a high normal potassium at 5.0, elevated BUN at 99, elevated creatinine at 3.91, elevated glucose of 212 A1c 6.5 Rocephin  3 L LR bolus And in discussion with family by ED provider to make patient  comfort measures only, I confirmed this with daughter at bedside   Review of systems:    Unfortunately review of systems cannot be obtained secondary to patient's altered mental status    Past History of the following :    Past Medical History:  Diagnosis Date   Anxiety    Breast cancer ([place])    COPD (chronic obstructive pulmonary disease) ([place])    Depression    High cholesterol    Hyperlipidemia    Myocardial infarction ([facility]) 10/22/2014   "mild"   On home oxygen therapy    "2L; 24/7" (10/24/2014)   Pneumonia 04/28/2013      Past Surgical History:  Procedure Laterality Date   APPLICATION OF CRANIAL NAVIGATION N/A 06/04/2021   Procedure: APPLICATION OF CRANIAL NAVIGATION;  Surgeon: Karsten Ro, DO;  Location: [place];  Service: Neurosurgery;  Laterality: N/A;   CARDIAC CATHETERIZATION  10/24/2014   CRANIOTOMY Right 06/04/2021   Procedure: RIGHT FRONTAL CRANIOTOMY FOR RESECTION OF TUMOR;  Surgeon: Karsten Ro, DO;  Location: [place];  Service: Neurosurgery;  Laterality: Right;   LEFT HEART CATHETERIZATION WITH CORONARY ANGIOGRAM N/A 10/24/2014   Procedure: LEFT HEART CATHETERIZATION WITH CORONARY ANGIOGRAM;  Surgeon: Laverda Page, MD;  Location: [facility] C[facility] LAB;  Service: Cardiovascular;  Laterality: N/A;   TUBAL LIGATION  1980's      Social History:      Social History   Tobacco Use   Smoking status: Former    Packs/day: 1.50    Years: 52.00    Pack years: 78.00    Types: Cigarettes    Quit date: 12/23/2012    Years since quitting: 8.4   Smokeless tobacco: Never  Substance Use Topics   Alcohol use: No    Alcohol/week: 0.0 standard drinks       Family History :    No family history on file. Family history could not be reviewed secondary to patient's altered mental status   Home Medications:   Prior to Admission medications   Medication Sig Start Date End Date Taking? Authorizing Provider  acetaminophen (TYLENOL) 325 MG tablet Take 2 tablets (650 mg  total) by mouth every 6 (six) hours as needed for mild pain (or Fever >/= 101). 06/09/21  Yes Sheikh, Omair Latif, DO  acetaZOLAMIDE (DIAMOX) 250 MG tablet Take 250 mg by mouth 2 (two) times daily. 05/20/21  Yes [provider]  ALPRAZolam Duanne Moron) 1 MG tablet Take 1 tablet (1 mg total) by mouth 2 (two) times daily as needed for anxiety. 06/09/21  Yes Sheikh, Omair Latif, DO  Amino Acids-Protein Hydrolys (FEEDING SUPPLEMENT, PRO-STAT SUGAR FREE 64,) LIQD Take 30  mLs by mouth 3 (three) times daily with meals. For albumin 2.9   Yes [provider]  amLODipine (NORVASC) 5 MG tablet Take 1 tablet (5 mg total) by mouth daily. 06/10/21  Yes Sheikh, Omair Latif, DO  ANORO ELLIPTA 62.5-25 MCG/INH AEPB Inhale 1 puff into the lungs daily. 12/26/20  Yes [provider]  buPROPion (WELLBUTRIN XL) 150 MG 24 hr tablet Take 150 mg by mouth daily. 05/17/21  Yes [provider]  Cholecalciferol 50 MCG (2000 UT) CAPS Take 1 capsule by mouth daily. 06/29/20  Yes [provider]  citalopram (CELEXA) 20 MG tablet Take 20 mg by mouth daily.   Yes [provider]  clopidogrel (PLAVIX) 75 MG tablet Take 1 tablet (75 mg total) by mouth daily. 10/24/14  Yes Adrian Prows, MD  dexamethasone (DECADRON) 2 MG tablet Take 1 tablet (2 mg total) by mouth 2 (two) times daily. 06/09/21  Yes Sheikh, Omair Latif, DO  HYDROcodone-acetaminophen (NORCO/VICODIN) 5-325 MG tablet Take 1 tablet by mouth every 4 (four) hours as needed for moderate pain. 06/09/21  Yes Sheikh, Omair Latif, DO  levETIRAcetam (KEPPRA) 250 MG tablet Take 1 tablet (250 mg total) by mouth 2 (two) times daily. 06/09/21  Yes Sheikh, Omair Latif, DO  lisinopril (ZESTRIL) 5 MG tablet Take 5 mg by mouth daily.   Yes [provider]  metoprolol tartrate (LOPRESSOR) 25 MG tablet Take 0.5 tablets (12.5 mg total) by mouth 2 (two) times daily. 10/24/14  Yes Adrian Prows, MD  Multiple Vitamin (MULTIVITAMIN WITH MINERALS) TABS tablet  Take 1 tablet by mouth daily.   Yes [provider]  omeprazole (PRILOSEC) 20 MG capsule Take 20 mg by mouth daily.   Yes [provider]  ondansetron (ZOFRAN) 4 MG tablet Take 1 tablet (4 mg total) by mouth every 6 (six) hours as needed for nausea. 06/09/21  Yes Sheikh, Omair Latif, DO  polyethylene glycol (MIRALAX / GLYCOLAX) 17 g packet Take 17 g by mouth daily. 06/09/21  Yes Sheikh, Omair Latif, DO  Probiotic Product (PROBIOTIC PO) Take 1 tablet by mouth in the morning and at bedtime.   Yes [provider]  Probiotic Product (RISA-BID PROBIOTIC) TABS Take 1 tablet by mouth in the morning and at bedtime.   Yes [provider]  senna-docusate (SENOKOT-S) 8.6-50 MG tablet Take 1 tablet by mouth at bedtime as needed for mild constipation. 06/09/21  Yes Sheikh, Omair Latif, DO  simvastatin (ZOCOR) 20 MG tablet Take 20 mg by mouth daily. 01/30/17  Yes [provider]  valACYclovir (VALTREX) 1000 MG tablet Take 1,000 mg by mouth 3 (three) times daily.  06/18/2021 Yes [provider]  benazepril (LOTENSIN) 5 MG tablet Take 5 mg by mouth daily. 02/24/21   [provider]  feeding supplement (ENSURE ENLIVE / ENSURE PLUS) LIQD Take 237 mLs by mouth 3 (three) times daily between meals. 06/09/21   Raiford Noble Latif, DO  pantoprazole (PROTONIX) 40 MG tablet Take 1 tablet (40 mg total) by mouth daily. Patient not taking: Reported on 06/16/2021 06/10/21   Kerney Elbe, DO     Allergies:     Allergies  Allergen Reactions   Sulfa Antibiotics Nausea And Vomiting     Physical Exam:   Vitals  Blood pressure (!) 95/40, pulse 88, temperature (!) 96.9 F (36.1 C), temperature source Rectal, resp. rate (!) 21, height 5\' 6"  (1.676 m), weight 68.9 kg, SpO2 100 %.  1.  General: Patient lying supine in bed,  no acute distress   2. Psychiatric: Somnolent and opens eyes only to touch and then back to sleep,  3. Neurologic: Patient currently  nonverbal, somnolent, opens eyes to touch and then back to sleep immediately, not following commands, face is symmetric,   4. HEENMT:  Head is atraumatic, normocephalic, pupils reactive to light, neck is supple, trachea is midline, mucous membranes are dry   5. Respiratory : Lungs are clear to auscultation bilaterally without wheezing, rhonchi, rales, no cyanosis, no increase in work of breathing or accessory muscle use   6. Cardiovascular : Heart rate normal, rhythm is regular, no murmurs, rubs or gallops, no peripheral edema, peripheral pulses palpated   7. Gastrointestinal:  Abdomen is soft, nondistended, nontender to palpation bowel sounds active, no masses or organomegaly palpated   8. Skin:  Skin is warm, dry and intact without rashes, acute lesions, or ulcers on limited exam   9.Musculoskeletal:  No acute deformities or trauma, no asymmetry in tone, no peripheral edema, peripheral pulses palpated, no tenderness to palpation in the extremities     Data Review:    CBC Recent Labs  Lab 06/06/2021 0814 05/30/2021 1450  WBC 10.4 13.4*  HGB 10.3* 10.6*  HCT 34.8* 35.8*  PLT 210 228  MCV 98.6 99.4  MCH 29.2 29.4  MCHC 29.6* 29.6*  RDW 13.3 13.2  LYMPHSABS 0.6* 0.6*  MONOABS 0.8 0.5  EOSABS 0.1 0.0  BASOSABS 0.0 0.0   ------------------------------------------------------------------------------------------------------------------  Results for orders placed or performed during the hospital encounter of 06/01/2021 (from the past 48 hour(s))  Lactic acid, plasma     Status: None   Collection Time: 06/04/2021  2:50 PM  Result Value Ref Range   Lactic Acid, Venous 1.1 0.5 - 1.9 mmol/L    Comment: Performed at [facility], [address] [place], [place] [address]  Comprehensive metabolic panel     Status: Abnormal   Collection Time: 06/05/2021  2:50 PM  Result Value Ref Range   Sodium 137 135 - 145 mmol/L   Potassium 5.0 3.5 - 5.1 mmol/L   Chloride 99 98 - 111 mmol/L   CO2  25 22 - 32 mmol/L   Glucose, Bld 212 (H) 70 - 99 mg/dL    Comment: Glucose reference range applies only to samples taken after fasting for at least 8 hours.   BUN 99 (H) 8 - 23 mg/dL   Creatinine, Ser 3.91 (H) 0.44 - 1.00 mg/dL   Calcium 9.2 8.9 - 10.3 mg/dL   Total Protein 6.0 (L) 6.5 - 8.1 g/dL   Albumin 3.4 (L) 3.5 - 5.0 g/dL   AST 13 (L) 15 - 41 U/L   ALT 19 0 - 44 U/L   Alkaline Phosphatase 47 38 - 126 U/L   Total Bilirubin 0.5 0.3 - 1.2 mg/dL   GFR, Estimated 11 (L) >60 mL/min    Comment: (NOTE) Calculated using the CKD-EPI Creatinine Equation (2021)    Anion gap 13 5 - 15    Comment: Performed at [facility] [address], [place], [place] [address]  CBC WITH DIFFERENTIAL     Status: Abnormal   Collection Time: 06/26/2021  2:50 PM  Result Value Ref Range   WBC 13.4 (H) 4.0 - 10.5 K/uL   RBC 3.60 (L) 3.87 - 5.11 MIL/uL   Hemoglobin 10.6 (L) 12.0 - 15.0 g/dL   HCT 35.8 (L) 36.0 - 46.0 %   MCV 99.4 80.0 - 100.0 fL   MCH 29.4 26.0 - 34.0  pg   MCHC 29.6 (L) 30.0 - 36.0 g/dL   RDW 13.2 11.5 - 15.5 %   Platelets 228 150 - 400 K/uL   nRBC 0.0 0.0 - 0.2 %   Neutrophils Relative % 92 %   Neutro Abs 12.2 (H) 1.7 - 7.7 K/uL   Lymphocytes Relative 4 %   Lymphs Abs 0.6 (L) 0.7 - 4.0 K/uL   Monocytes Relative 3 %   Monocytes Absolute 0.5 0.1 - 1.0 K/uL   Eosinophils Relative 0 %   Eosinophils Absolute 0.0 0.0 - 0.5 K/uL   Basophils Relative 0 %   Basophils Absolute 0.0 0.0 - 0.1 K/uL   Immature Granulocytes 1 %   Abs Immature Granulocytes 0.10 (H) 0.00 - 0.07 K/uL    Comment: Performed at [facility], [address] [place], [place] [address]  Protime-INR     Status: None   Collection Time: 06/21/2021  2:50 PM  Result Value Ref Range   Prothrombin Time 12.7 11.4 - 15.2 seconds   INR 1.0 0.8 - 1.2    Comment: (NOTE) INR goal varies based on device and disease states. Performed at [facility], [address], [place], [place] [address]   APTT     Status: None   Collection  Time: 06/08/2021  2:50 PM  Result Value Ref Range   aPTT 24 24 - 36 seconds    Comment: Performed at [facility], [address] [place], [place] [address]  Urinalysis, Routine w reflex microscopic In/Out Cath Urine     Status: Abnormal   Collection Time: 05/30/2021  3:02 PM  Result Value Ref Range   Color, Urine AMBER (A) YELLOW    Comment: BIOCHEMICALS MAY BE AFFECTED BY COLOR   APPearance CLOUDY (A) CLEAR   Specific Gravity, Urine 1.016 1.005 - 1.030   pH 5.0 5.0 - 8.0   Glucose, UA NEGATIVE NEGATIVE mg/dL   Hgb urine dipstick NEGATIVE NEGATIVE   Bilirubin Urine NEGATIVE NEGATIVE   Ketones, ur NEGATIVE NEGATIVE mg/dL   Protein, ur >=300 (A) NEGATIVE mg/dL   Nitrite NEGATIVE NEGATIVE   Leukocytes,Ua TRACE (A) NEGATIVE   RBC / HPF 0-5 0 - 5 RBC/hpf   WBC, UA 0-5 0 - 5 WBC/hpf   Bacteria, UA RARE (A) NONE SEEN   Amorphous Crystal PRESENT     Comment: Performed at [facility], [address], [place], [place] [address]  Resp Panel by RT-PCR (Flu A&B, Covid) Nasopharyngeal Swab     Status: None   Collection Time: 06/01/2021  3:03 PM   Specimen: Nasopharyngeal Swab; Nasopharyngeal(NP) swabs in vial transport medium  Result Value Ref Range   SARS Coronavirus 2 by RT PCR NEGATIVE NEGATIVE    Comment: (NOTE) SARS-CoV-2 target nucleic acids are NOT DETECTED.  The SARS-CoV-2 RNA is generally detectable in upper respiratory specimens during the acute phase of infection. The lowest concentration of SARS-CoV-2 viral copies this assay can detect is 138 copies/mL. A negative result does not preclude SARS-Cov-2 infection and should not be used as the sole basis for treatment or other patient management decisions. A negative result may occur with  improper specimen collection/handling, submission of specimen other than nasopharyngeal swab, presence of viral mutation(s) within the areas targeted by this assay, and inadequate number of viral copies(<138 copies/mL). A negative result must be  combined with clinical observations, patient history, and epidemiological information. The expected result is Negative.  Fact Sheet for Patients:  EntrepreneurPulse.com.au  Fact Sheet for Healthcare Providers:  IncredibleEmployment.be  This test is no t yet approved or cleared by the [place] and  has been authorized for detection and/or diagnosis of SARS-CoV-2 by FDA under an Emergency Use Authorization (EUA). This EUA will remain  in effect (meaning this test can be used) for the duration of the COVID-19 declaration under Section 564(b)(1) of the Act, 21 U.S.C.section 360bbb-3(b)(1), unless the authorization is terminated  or revoked sooner.       Influenza A by PCR NEGATIVE NEGATIVE   Influenza B by PCR NEGATIVE NEGATIVE    Comment: (NOTE) The Xpert Xpress SARS-CoV-2/FLU/RSV plus assay is intended as an aid in the diagnosis of influenza from Nasopharyngeal swab specimens and should not be used as a sole basis for treatment. Nasal washings and aspirates are unacceptable for Xpert Xpress SARS-CoV-2/FLU/RSV testing.  Fact Sheet for Patients: EntrepreneurPulse.com.au  Fact Sheet for Healthcare Providers: IncredibleEmployment.be  This test is not yet approved or cleared by the [place] FDA and has been authorized for detection and/or diagnosis of SARS-CoV-2 by FDA under an Emergency Use Authorization (EUA). This EUA will remain in effect (meaning this test can be used) for the duration of the COVID-19 declaration under Section 564(b)(1) of the Act, 21 U.S.C. section 360bbb-3(b)(1), unless the authorization is terminated or revoked.  Performed at [facility], [address], [place], [place] [address]   Blood gas, arterial (at [facility])     Status: Abnormal   Collection Time: 06/19/2021  3:22 PM  Result Value Ref Range   FIO2 36.00    pH, Arterial 7.189 (LL) 7.350 - 7.450    Comment:  CRITICAL RESULT CALLED TO, READ BACK BY AND VERIFIED WITH: LONG,L ON 06/03/2021 AT 1535 BY LOY,C    pCO2 arterial 62.8 (H) 32.0 - 48.0 mmHg   pO2, Arterial 273 (H) 83.0 - 108.0 mmHg   Bicarbonate 20.2 20.0 - 28.0 mmol/L   Acid-base deficit 3.6 (H) 0.0 - 2.0 mmol/L   O2 Saturation 98.8 %   Patient temperature 34.6    Allens test (pass/fail) PASS PASS    Comment: Performed at [facility], [address], [place], [place] [address]  Blood Culture (routine x 2)     Status: None (Preliminary result)   Collection Time: 06/25/2021  4:05 PM   Specimen: Left Antecubital; Blood  Result Value Ref Range   Specimen Description      LEFT ANTECUBITAL BOTTLES DRAWN AEROBIC AND ANAEROBIC   Special Requests      Blood Culture adequate volume Performed at [facility], [address] [place], [place] [address]    Culture PENDING    Report Status PENDING   Blood Culture (routine x 2)     Status: None (Preliminary result)   Collection Time: 06/22/2021  4:05 PM   Specimen: BLOOD RIGHT HAND  Result Value Ref Range   Specimen Description      BLOOD RIGHT HAND BOTTLES DRAWN AEROBIC AND ANAEROBIC   Special Requests      Blood Culture adequate volume Performed at [facility], [address], [place], [place] [address]    Culture PENDING    Report Status PENDING   Lactic acid, plasma     Status: None   Collection Time: 05/30/2021  5:33 PM  Result Value Ref Range   Lactic Acid, Venous 1.8 0.5 - 1.9 mmol/L    Comment: Performed at [facility], [address] [place], [place] [address]  Blood gas, arterial (at [facility])     Status:  Abnormal   Collection Time: 05/28/2021  6:36 PM  Result Value Ref Range   FIO2 40.00    pH, Arterial 7.176 (LL) 7.350 - 7.450    Comment: CRITICAL RESULT CALLED TO, READ BACK BY AND VERIFIED WITH: LONG,L AT 1847 ON 7.21.22 BY RUCINSKI,B    pCO2 arterial 64.7 (H) 32.0 - 48.0 mmHg   pO2, Arterial 120 (H) 83.0 - 108.0 mmHg   Bicarbonate 20.2 20.0 - 28.0 mmol/L   Acid-base deficit  3.9 (H) 0.0 - 2.0 mmol/L   O2 Saturation 98.2 %   Patient temperature 34.6    Allens test (pass/fail) PASS PASS    Comment: Performed at [facility], [address] [place],  [address]    Chemistries  Recent Labs  Lab 06/19/2021 [phone] 06/03/2021 1450  NA 136 137  K 4.6 5.0  CL 97* 99  CO2 27 25  GLUCOSE 136* 212*  BUN 88* 99*  CREATININE 3.40* 3.91*  CALCIUM 9.1 9.2  MG 2.4  --   AST 11* 13*  ALT 18 19  ALKPHOS 44 47  BILITOT 0.4 0.5   ------------------------------------------------------------------------------------------------------------------  ------------------------------------------------------------------------------------------------------------------ GFR: Estimated Creatinine Clearance: 11.5 mL/min (A) (by C-G formula based on SCr of 3.91 mg/dL (H)). Liver Function Tests: Recent Labs  Lab 06/26/2021 0814 06/21/2021 1450  AST 11* 13*  ALT 18 19  ALKPHOS 44 47  BILITOT 0.4 0.5  PROT 5.9* 6.0*  ALBUMIN 3.4* 3.4*   No results for input(s): LIPASE, AMYLASE in the last 168 hours. No results for input(s): AMMONIA in the last 168 hours. Coagulation Profile: Recent Labs  Lab 06/01/2021 1450  INR 1.0   Cardiac Enzymes: No results for input(s): CKTOTAL, CKMB, CKMBINDEX, TROPONINI in the last 168 hours. BNP (last 3 results) No results for input(s): PROBNP in the last 8760 hours. HbA1C: Recent Labs    06/19/2021 0815  HGBA1C 6.5*   CBG: No results for input(s): GLUCAP in the last 168 hours. Lipid Profile: No results for input(s): CHOL, HDL, LDLCALC, TRIG, CHOLHDL, LDLDIRECT in the last 72 hours. Thyroid Function Tests: No results for input(s): TSH, T4TOTAL, FREET4, T3FREE, THYROIDAB in the last 72 hours. Anemia Panel: No results for input(s): VITAMINB12, FOLATE, FERRITIN, TIBC, IRON, RETICCTPCT in the last 72 hours.  --------------------------------------------------------------------------------------------------------------- Urine analysis:     Component Value Date/Time   COLORURINE AMBER (A) 06/25/2021 1502   APPEARANCEUR CLOUDY (A) 06/16/2021 1502   LABSPEC 1.016 05/28/2021 1502   PHURINE 5.0 06/14/2021 1502   GLUCOSEU NEGATIVE 06/07/2021 1502   HGBUR NEGATIVE 06/18/2021 1502   BILIRUBINUR NEGATIVE 06/10/2021 1502   KETONESUR NEGATIVE 06/24/2021 1502   PROTEINUR >=300 (A) 06/19/2021 1502   NITRITE NEGATIVE 06/16/2021 1502   LEUKOCYTESUR TRACE (A) 06/26/2021 1502      Imaging Results:    CT Head Wo Contrast  Result Date: 06/24/2021 CLINICAL DATA:  Mental status change, unknown cause EXAM: CT HEAD WITHOUT CONTRAST TECHNIQUE: Contiguous axial images were obtained from the base of the skull through the vertex without intravenous contrast. COMPARISON:  Head CT 06/03/2021, brain MRI 06/05/2021 FINDINGS: Brain: Recent right frontal craniotomy for mass resection. Right frontal edema in the resection cavity without significant bleed. Slight residual edema and frontal midline shift. No new hemorrhage. No evidence of acute ischemia. No subdural or extra-axial collection. No hydrocephalus. Vascular: Atherosclerosis of skullbase vasculature without hyperdense vessel or abnormal calcification. Skull: Right frontal craniotomy. Sinuses/Orbits: Minimal right mastoid air cells. No paranasal sinus inflammation. Other: None. IMPRESSION: 1. Recent right frontal craniotomy for  mass resection. Right frontal edema in the resection cavity without significant bleed. Slight residual edema and frontal midline shift. 2. No new abnormality. Electronically Signed   By: Keith Rake M.D.   On: 05/28/2021 18:18   DG Chest Port 1 View  Result Date: 06/24/2021 CLINICAL DATA:  Shortness of breath, COVID test pending EXAM: PORTABLE CHEST 1 VIEW COMPARISON:  Chest radiograph dated 06/04/2021. CT chest dated 05/25/2021. FINDINGS: Medial left upper lobe nodule, better visualized on prior CT. No focal consolidation.  No pleural effusion or pneumothorax. The heart is  normal in size.  Thoracic aortic atherosclerosis. IMPRESSION: Medial left upper lobe nodule, better visualized on prior CT. No evidence of acute cardiopulmonary disease. Electronically Signed   By: Julian Hy M.D.   On: 06/06/2021 15:42      Assessment & Plan:    Active Problems:   Comfort measures only status   Comfort measures only End-of-life order set utilized Continue analgesics, anxiolytics as needed Antiemetics and anticholinergics also ordered as needed Family to be allowed to stay with patient Daughter at bedside understands and agrees with plan Will continue fluids for comfort Will discontinue BiPAP but will continue nasal cannula No blood draws Continue to monitor AKI 3 L bolus given in ED, will continue fluids for maintenance, we will not trend creatinine in the setting of comfort measures only Acute respiratory failure with hypercapnia Discontinuing BiPAP in the setting of comfort measures only, continue nasal cannula for comfort and morphine pushes for comfort Acute metabolic encephalopathy Likely infection contributing, discontinue antibiotics in the setting of comfort measures only, CO2 also contributing, discontinuing BiPAP in the setting of comfort measures only no further work-up for altered mental status as we will be focusing on keeping patient comfortable.    DVT Prophylaxis-   no DVT prophylaxis in the setting of comfort measures only  AM Labs Ordered, also please review Full Orders  Family Communication: Admission, patients condition and plan of care including tests being ordered have been discussed with the patient and daughter who indicate understanding and agree with the plan and Code Status.  Code Status: DNR  Admission status: Inpatient :The appropriate admission status for this patient is INPATIENT. Inpatient status is judged to be reasonable and necessary in order to provide the required intensity of service to ensure the patient's safety. The  patient's presenting symptoms, physical exam findings, and initial radiographic and laboratory data in the context of their chronic comorbidities is felt to place them at high risk for further clinical deterioration. Furthermore, it is not anticipated that the patient will be medically stable for discharge from the hospital within 2 midnights of admission. The following factors support the admission status of inpatient.      * I certify that at the point of admission it is my clinical judgment that the patient will require inpatient hospital care spanning beyond 2 midnights from the point of admission due to high intensity of service, high risk for further deterioration and high frequency of surveillance required.*  Time spent in minutes : [place]

## 2021-06-17 NOTE — ED Notes (Signed)
Date and time results received: 06/19/2021 1845   Test: PH ABG Critical Value: 7.176  Name of Provider Notified: DR.Haviland  Orders Received? Or Actions Taken?: Notified

## 2021-06-17 NOTE — ED Notes (Signed)
Pt cleaned up and new brief applied

## 2021-06-17 NOTE — Progress Notes (Signed)
Location:  [facility] Room Number: 143 Place of Service:  SNF (31)   CODE STATUS: full code   Allergies  Allergen Reactions  . Sulfa Antibiotics Nausea And Vomiting    Chief Complaint  Patient presents with  . Acute Visit    Change in status     HPI:  Yesterday she was in her usual state of health. Today she is short of breath; cannot maintain her 02 sats with 02 at 6 liters. She has not been able to eat or drink today. She is unable to cough. She has had a duoneb without relief of her symptoms. She is not febrile. She will need to go to the ED for further work up and treatment.   Past Medical History:  Diagnosis Date  . Anxiety   . Breast cancer ([place])   . COPD (chronic obstructive pulmonary disease) ([place])   . Depression   . High cholesterol   . Hyperlipidemia   . Myocardial infarction ([place]) 10/22/2014   "mild"  . On home oxygen therapy    "2L; 24/7" (10/24/2014)  . Pneumonia 04/28/2013    Past Surgical History:  Procedure Laterality Date  . APPLICATION OF CRANIAL NAVIGATION N/A 06/04/2021   Procedure: APPLICATION OF CRANIAL NAVIGATION;  Surgeon: Dawley, Theodoro Doing, DO;  Location: [facility];  Service: Neurosurgery;  Laterality: N/A;  . CARDIAC CATHETERIZATION  10/24/2014  . 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 LEFT HEART CATHETERIZATION WITH CORONARY ANGIOGRAM N/A 10/24/2014   Procedure: LEFT HEART CATHETERIZATION WITH CORONARY ANGIOGRAM;  Surgeon: Laverda Page, MD;  Location[facility] C[facility] LAB;  Service: Cardiovascular;  Laterality: N/A;  . TUBAL LIGATION  1980's    Social History   Socioeconomic History  . Marital status: Widowed    Spouse name: Not on file  . Number of children: Not on file  . Years of education: Not on file  . Highest education level: Not on file  Occupational History  . Not on  file  Tobacco Use  . Smoking status: Former    Packs/day: 1.50    Years: 52.00    Pack years: 78.00    Types: Cigarettes    Quit date: 12/23/2012    Years since quitting: 8.4  . Smokeless tobacco: Never  Substance and Sexual Activity  . Alcohol use: No    Alcohol/week: 0.0 standard drinks  . Drug use: No  . Sexual activity: Never  Other Topics Concern  . Not on file  Social History Narrative  . Not on file   Social Determinants of Health   Financial Resource Strain: Not on file  Food Insecurity: Not on file  Transportation Needs: Not on file  Physical Activity: Not on file  Stress: Not on file  Social Connections: Not on file  Intimate Partner Violence: Not on file   No family history on file.    VITAL SIGNS BP (!) 100/50   Pulse 100   Temp 97.6 F (36.4 C)   Resp (!) 24   SpO2 (!) 88% Comment: 02 at 6 liters nasal  Outpatient Encounter Medications as of 06/16/2021  Medication Sig  . acetaminophen (TYLENOL) 325 MG tablet Take 2 tablets (650 mg total) by mouth every 6 (six) hours as needed for mild pain (or Fever >/= 101).  Marland Kitchen acetaZOLAMIDE (DIAMOX) 250 MG tablet Take 250  mg by mouth 2 (two) times daily.  Marland Kitchen ALPRAZolam (XANAX) 1 MG tablet Take 1 tablet (1 mg total) by mouth 2 (two) times daily as needed for anxiety.  Marland Kitchen amLODipine (NORVASC) 5 MG tablet Take 1 tablet (5 mg total) by mouth daily.  Jearl Klinefelter ELLIPTA 62.5-25 MCG/INH AEPB Inhale 1 puff into the lungs daily.  . benazepril (LOTENSIN) 5 MG tablet Take 5 mg by mouth daily.  Marland Kitchen buPROPion (WELLBUTRIN XL) 150 MG 24 hr tablet Take 150 mg by mouth daily.  . Cholecalciferol 50 MCG (2000 UT) CAPS Take 1 capsule by mouth daily.  . citalopram (CELEXA) 20 MG tablet Take 20 mg by mouth daily.  . clopidogrel (PLAVIX) 75 MG tablet Take 1 tablet (75 mg total) by mouth daily.  Marland Kitchen dexamethasone (DECADRON) 2 MG tablet Take 1 tablet (2 mg total) by mouth 2 (two) times daily.  . feeding supplement (ENSURE ENLIVE / ENSURE PLUS) LIQD  Take 237 mLs by mouth 3 (three) times daily between meals.  Marland Kitchen HYDROcodone-acetaminophen (NORCO/VICODIN) 5-325 MG tablet Take 1 tablet by mouth every 4 (four) hours as needed for moderate pain.  Marland Kitchen levETIRAcetam (KEPPRA) 250 MG tablet Take 1 tablet (250 mg total) by mouth 2 (two) times daily.  . metoprolol tartrate (LOPRESSOR) 25 MG tablet Take 0.5 tablets (12.5 mg total) by mouth 2 (two) times daily.  . Multiple Vitamin (MULTIVITAMIN WITH MINERALS) TABS tablet Take 1 tablet by mouth daily.  . ondansetron (ZOFRAN) 4 MG tablet Take 1 tablet (4 mg total) by mouth every 6 (six) hours as needed for nausea.  . pantoprazole (PROTONIX) 40 MG tablet Take 1 tablet (40 mg total) by mouth daily.  . polyethylene glycol (MIRALAX / GLYCOLAX) 17 g packet Take 17 g by mouth daily.  . Probiotic Product (PROBIOTIC PO) Take 1 tablet by mouth in the morning and at bedtime.  . senna-docusate (SENOKOT-S) 8.6-50 MG tablet Take 1 tablet by mouth at bedtime as needed for mild constipation.  . simvastatin (ZOCOR) 20 MG tablet Take 20 mg by mouth daily.  . valACYclovir (VALTREX) 1000 MG tablet Take 1,000 mg by mouth 3 (three) times daily.   No facility-administered encounter medications on file as of 06/16/2021.     SIGNIFICANT DIAGNOSTIC EXAMS   PREVIOUS   05-25-21: MRI of brain:   2.7 cm anterior inferior right frontal lobe mass with extensive surrounding edema. Metastasis is the primary differential consideration. There is 1 cm of leftward midline shift. No hydrocephalus.  2   Postcontrast imaging is recommended to evaluate for additional lesions.  05-25-21: chest x-ray:  Low lung volumes with mild bibasilar atelectasis. Mild left base infiltrate cannot be excluded  05-25-21: ct of chest abdomen and pelvis:  1. Left upper lobe 1.7 x 1.0 cm subpleural nodule suspicious for a neoplastic process. Additional smaller pulmonary nodules as above. 2. No evidence of metastatic disease in the abdomen and pelvis. 3. Severe  sigmoid diverticulosis. No bowel obstruction. Normal appendix. 4. Multinodular goiter. 5. Aortic Atherosclerosis  and Emphysema   05-26-21: EEG:  This study is suggestive of cortical dysfunction in the right frontal region likely secondary to underlying mass.  Additionally, rhythmic delta slowing in frontal region is on the ictal-interictal continuum with low potential for seizures.  No seizures or definite epileptiform discharges were seen throughout the recording.   NO NEW EXAMS.    LABS REVIEWED PREVIOUS   05-25-21; wbc 8.8; hgb 13.7; hct 44.2; mcv 95.5 plt 282; glucose 122; bun 39; creat 1.76; k+  4.4; na++ 135; ca 9.2; GFR 30; liver normal albumin 4.2 05-30-21: wbc 6.9; hgb 11.7; hct 38.4; mcv 994.1 plt 230; glucose 149; bun 18; creat 0.68; k+ 4.8; na++ 139; ca 9.5; GFR >60 06-03-21: glucose 188; bun 23; creat 0.66; k+ 5.5; na++ 140; ca 9.5; GFR >60 06-09-21: wbc 9.1; hgb 10.9; hct 35.5; mcv 94.9 plt 185; glucose 277; bun 29; creat 0.71; k+ 4.2; na++ 138; ca 9.3 GFR >60; liver normal albumin 2.9; mag 1.9; phos 2.7   TODAY  06/11/2021: wbc 10.4; hgb 10.3; hct 34.8; mcv 98,6 plt 210; glucose 136; bun 88; creat 3.40; k+ 4.6; na++ 136; GFR 13; liver normal albumin 3.4 mag 2.4; phos 7.8 hgb a1c 6.5   Review of Systems  Constitutional:  Negative for malaise/fatigue.  Respiratory:  Positive for shortness of breath. Negative for cough.        "Feels tight" unable to cough   Cardiovascular:  Negative for chest pain, palpitations and leg swelling.  Gastrointestinal:  Negative for abdominal pain, constipation and heartburn.  Musculoskeletal:  Negative for back pain, joint pain and myalgias.  Skin: Negative.   Neurological:  Negative for dizziness.  Psychiatric/Behavioral:  The patient is not nervous/anxious.    Physical Exam Constitutional:      General: She is not in acute distress.    Appearance: She is well-developed. She is not diaphoretic.  Eyes:     Comments: Conjunctivae pale   Neck:      Thyroid: No thyromegaly.  Cardiovascular:     Rate and Rhythm: Normal rate and regular rhythm.     Heart sounds: Normal heart sounds.     Comments: NO pedal pulses; NO post tib pulses  Pulmonary:     Effort: Pulmonary effort is normal. No respiratory distress.     Comments: Lung sounds diminished throughout with little air exchange present  Abdominal:     General: Bowel sounds are normal. There is no distension.     Palpations: Abdomen is soft.     Tenderness: There is no abdominal tenderness.  Musculoskeletal:        General: Normal range of motion.     Cervical back: Neck supple.     Right lower leg: No edema.     Left lower leg: No edema.  Lymphadenopathy:     Cervical: No cervical adenopathy.  Skin:    General: Skin is dry.     Coloration: Skin is not pale.     Comments: Cold to touch  Feet cold and slightly cyanotic   Neurological:     Mental Status: She is alert and oriented to person, place, and time.  Psychiatric:        Mood and Affect: Mood normal.     ASSESSMENT/ PLAN:  TODAY  Acute on chronic respiratory failure with hypoxia and hypercapnia Panlobular emphysema Acute kidney injury  She has been given 1 duoneb and has been give 125 mg solumedrol IM Will send to the ED for further work up and treatment options.       Ok Edwards NP [facility]  Contact [phone] Monday through Friday 8am- 5pm  After hours call [phone]

## 2021-06-17 NOTE — ED Notes (Signed)
Date and time results received: 06/18/2021 1536   Test: PH ABG  Critical Value: 7.189  Name of Provider Notified: Dr, Gilford Raid   Orders Received? Or Actions Taken?: BIpap

## 2021-06-17 NOTE — ED Notes (Signed)
Bair hugger applied to pt.  

## 2021-06-17 NOTE — Progress Notes (Signed)
RT placed patient back on BIPAP upon return to room from CT.

## 2021-06-17 NOTE — ED Notes (Signed)
Respiratory called for bipap.

## 2021-06-17 NOTE — ED Provider Notes (Signed)
[facility] Provider Note   CSN: [id number] Arrival date & time: 06/15/2021  1358     History Chief Complaint  Patient presents with   Shortness of Breath    Barbara Gibson is a 75 y.o. female.  Pt presents to the ED today with sob.  The pt has been at the [facility] recovering from brain resection for a brain mass on 7/8 by NS.  She was d/c on 7/13.  She has a hx of COPD and is normally on 2-4L.  She has not been eating or drinking today and has sob.  She was given a duoneb and 125 mg solumedrol IM by the SNF prior to EMS transport.  Pt is unable to give me any hx.       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[place]) 06/10/2021   History of left breast cancer 06/10/2021   Chronic respiratory failure with hypoxia and hypercapnia ([place]) 06/10/2021   Hx of smoking 06/10/2021   Aortic atherosclerosis ([place]) 06/10/2021   Osteoporosis 06/10/2021   Hyperlipidemia LDL goal <100 06/10/2021   Essential hypertension 06/10/2021   Chronic constipation 06/10/2021   GERD without esophagitis 06/10/2021   Major depression, recurrent, chronic ([place]) 06/10/2021   Seizure ([place]) 06/10/2021   Left upper lobe pulmonary nodule 06/10/2021   Pressure injury of skin 06/09/2021   Protein-calorie malnutrition, severe ([place]) 05/31/2021   AKI (acute kidney injury) ([place]) 05/25/2021   Weakness 05/25/2021    Class: Acute   Acute lower UTI 05/25/2021   Dehydration 05/25/2021    Class: Acute   Brain mass 05/25/2021     Class: Acute   Encephalopathy 05/25/2021   Osteopenia 03/31/2016   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   Coronary artery disease involving native coronary artery of native heart without angina pectoris 03/31/2016   NSTEMI, initial episode of care [facility]) 10/23/2014    Past Surgical History:  Procedure Laterality Date   APPLICATION OF CRANIAL NAVIGATION N/A 06/04/2021   Procedure: APPLICATION OF CRANIAL NAVIGATION;  Surgeon: Karsten Ro, DO;  Location: [facility];  Service: Neurosurgery;  Laterality: N/A;   CARDIAC CATHETERIZATION  10/24/2014   CRANIOTOMY Right 06/04/2021   Procedure: RIGHT FRONTAL CRANIOTOMY FOR RESECTION OF TUMOR;  Surgeon: Karsten Ro, DO;  Location: [facility];  Service: Neurosurgery;  Laterality: Right;   LEFT HEART CATHETERIZATION WITH CORONARY ANGIOGRAM N/A 10/24/2014   Procedure: LEFT HEART CATHETERIZATION WITH CORONARY ANGIOGRAM;  Surgeon: Laverda Page, MD;  Location: [facility] CATH LAB;  Service: Cardiovascular;  Laterality: N/A;   TUBAL LIGATION  1980's     OB History   No obstetric history on file.     No family history on file.  Social History   Tobacco Use   Smoking status: Former    Packs/day: 1.50    Years: 52.00    Pack years: 78.00    Types: Cigarettes    Quit date: 12/23/2012    Years since quitting:  8.4   Smokeless tobacco: Never  Substance Use Topics   Alcohol use: No    Alcohol/week: 0.0 standard drinks   Drug use: No    Home Medications Prior to Admission medications   Medication Sig Start Date End Date Taking? Authorizing Provider  acetaminophen (TYLENOL) 325 MG tablet Take 2 tablets (650 mg total) by mouth every 6 (six) hours as needed for mild pain (or Fever >/= 101). 06/09/21   Sheikh, Omair Latif, DO  acetaZOLAMIDE (DIAMOX) 250 MG tablet Take 250 mg by mouth 2 (two) times daily. 05/20/21   [provider]  ALPRAZolam Duanne Moron) 1 MG tablet Take 1 tablet (1 mg total) by mouth 2 (two) times daily as needed for anxiety. 06/09/21    Raiford Noble Latif, DO  amLODipine (NORVASC) 5 MG tablet Take 1 tablet (5 mg total) by mouth daily. 06/10/21   Sheikh, Omair Latif, DO  ANORO ELLIPTA 62.5-25 MCG/INH AEPB Inhale 1 puff into the lungs daily. 12/26/20   [provider]  benazepril (LOTENSIN) 5 MG tablet Take 5 mg by mouth daily. 02/24/21   [provider]  buPROPion (WELLBUTRIN XL) 150 MG 24 hr tablet Take 150 mg by mouth daily. 05/17/21   [provider]  Cholecalciferol 50 MCG (2000 UT) CAPS Take 1 capsule by mouth daily. 06/29/20   [provider]  citalopram (CELEXA) 20 MG tablet Take 20 mg by mouth daily.    [provider]  clopidogrel (PLAVIX) 75 MG tablet Take 1 tablet (75 mg total) by mouth daily. 10/24/14   Adrian Prows, MD  dexamethasone (DECADRON) 2 MG tablet Take 1 tablet (2 mg total) by mouth 2 (two) times daily. 06/09/21   Raiford Noble Latif, DO  feeding supplement (ENSURE ENLIVE / ENSURE PLUS) LIQD Take 237 mLs by mouth 3 (three) times daily between meals. 06/09/21   Raiford Noble Latif, DO  HYDROcodone-acetaminophen (NORCO/VICODIN) 5-325 MG tablet Take 1 tablet by mouth every 4 (four) hours as needed for moderate pain. 06/09/21   Raiford Noble Latif, DO  levETIRAcetam (KEPPRA) 250 MG tablet Take 1 tablet (250 mg total) by mouth 2 (two) times daily. 06/09/21   Raiford Noble Latif, DO  metoprolol tartrate (LOPRESSOR) 25 MG tablet Take 0.5 tablets (12.5 mg total) by mouth 2 (two) times daily. 10/24/14   Adrian Prows, MD  Multiple Vitamin (MULTIVITAMIN WITH MINERALS) TABS tablet Take 1 tablet by mouth daily.    [provider]  ondansetron (ZOFRAN) 4 MG tablet Take 1 tablet (4 mg total) by mouth every 6 (six) hours as needed for nausea. 06/09/21   Raiford Noble Latif, DO  pantoprazole (PROTONIX) 40 MG tablet Take 1 tablet (40 mg total) by mouth daily. 06/10/21   Sheikh, Georgina Quint Latif, DO  polyethylene glycol (MIRALAX / GLYCOLAX) 17 g packet Take 17 g by mouth daily. 06/09/21   Raiford Noble Latif, DO  Probiotic Product (PROBIOTIC PO) Take 1 tablet by mouth in the morning and at bedtime.    [provider]  senna-docusate (SENOKOT-S) 8.6-50 MG tablet Take 1 tablet by mouth at bedtime as needed for mild constipation. 06/09/21   Raiford Noble Latif, DO  simvastatin (ZOCOR) 20 MG tablet Take 20 mg by mouth daily. 01/30/17   [provider]  valACYclovir (VALTREX) 1000 MG tablet Take 1,000 mg by mouth 3 (three) times daily.  06/14/2021  [provider]    Allergies    Sulfa antibiotics  Review of Systems   Review of Systems  Unable to  perform ROS: Mental status change   Physical Exam Updated Vital Signs BP (!) 80/30   Pulse 79   Temp (!) 96.9 F (36.1 C) (Rectal)   Resp 17   Ht 5\' 6"  (1.676 m)   Wt 68.9 kg   SpO2 100%   BMI 24.53 kg/m   Physical Exam Vitals and nursing note reviewed.  Constitutional:      General: She is in acute distress.     Appearance: She is ill-appearing.  HENT:     Head: Normocephalic.     Comments: Staples going across head above her forehead    Mouth/Throat:     Mouth: Mucous membranes are dry.  Eyes:     Extraocular Movements: Extraocular movements intact.     Pupils: Pupils are equal, round, and reactive to light.  Cardiovascular:     Rate and Rhythm: Normal rate and regular rhythm.  Pulmonary:     Effort: Pulmonary effort is normal.     Breath sounds: Normal breath sounds.  Abdominal:     General: Bowel sounds are normal.     Palpations: Abdomen is soft.  Musculoskeletal:        General: Normal range of motion.     Cervical back: Normal range of motion and neck supple.  Skin:    General: Skin is warm.     Capillary Refill: Capillary refill takes less than 2 seconds.  Neurological:     Comments: Minimally responsive  Psychiatric:     Comments: Unable to assess    ED Results / Procedures / Treatments   Labs (all labs ordered are listed, but only abnormal results are displayed) Labs Reviewed   COMPREHENSIVE METABOLIC PANEL - Abnormal; Notable for the following components:      Result Value   Glucose, Bld 212 (*)    BUN 99 (*)    Creatinine, Ser 3.91 (*)    Total Protein 6.0 (*)    Albumin 3.4 (*)    AST 13 (*)    GFR, Estimated 11 (*)    All other components within normal limits  CBC WITH DIFFERENTIAL/PLATELET - Abnormal; Notable for the following components:   WBC 13.4 (*)    RBC 3.60 (*)    Hemoglobin 10.6 (*)    HCT 35.8 (*)    MCHC 29.6 (*)    Neutro Abs 12.2 (*)    Lymphs Abs 0.6 (*)    Abs Immature Granulocytes 0.10 (*)    All other components within normal limits  URINALYSIS, ROUTINE W REFLEX MICROSCOPIC - Abnormal; Notable for the following components:   Color, Urine AMBER (*)    APPearance CLOUDY (*)    Protein, ur >=300 (*)    Leukocytes,Ua TRACE (*)    Bacteria, UA RARE (*)    All other components within normal limits  BLOOD GAS, ARTERIAL - Abnormal; Notable for the following components:   pH, Arterial 7.189 (*)    pCO2 arterial 62.8 (*)    pO2, Arterial 273 (*)    Acid-base deficit 3.6 (*)    All other components within normal limits  BLOOD GAS, ARTERIAL - Abnormal; Notable for the following components:   pH, Arterial 7.176 (*)    pCO2 arterial 64.7 (*)    pO2, Arterial 120 (*)    Acid-base deficit 3.9 (*)    All other components within normal limits  CULTURE, BLOOD (ROUTINE X 2)  CULTURE, BLOOD (ROUTINE X 2)  RESP PANEL BY RT-PCR (FLU A&B, COVID) ARPGX2  URINE CULTURE  LACTIC ACID, PLASMA  LACTIC ACID, PLASMA  PROTIME-INR  APTT    EKG None  Radiology CT Head Wo Contrast  Result Date: 06/23/2021 CLINICAL DATA:  Mental status change, unknown cause EXAM: CT HEAD WITHOUT CONTRAST TECHNIQUE: Contiguous axial images were obtained from the base of the skull through the vertex without intravenous contrast. COMPARISON:  Head CT 06/03/2021, brain MRI 06/05/2021 FINDINGS: Brain: Recent right frontal craniotomy for mass resection. Right frontal  edema in the resection cavity without significant bleed. Slight residual edema and frontal midline shift. No new hemorrhage. No evidence of acute ischemia. No subdural or extra-axial collection. No hydrocephalus. Vascular: Atherosclerosis of skullbase vasculature without hyperdense vessel or abnormal calcification. Skull: Right frontal craniotomy. Sinuses/Orbits: Minimal right mastoid air cells. No paranasal sinus inflammation. Other: None. IMPRESSION: 1. Recent right frontal craniotomy for mass resection. Right frontal edema in the resection cavity without significant bleed. Slight residual edema and frontal midline shift. 2. No new abnormality. Electronically Signed   By: Keith Rake M.D.   On: 06/26/2021 18:18   DG Chest Port 1 View  Result Date: 06/14/2021 CLINICAL DATA:  Shortness of breath, COVID test pending EXAM: PORTABLE CHEST 1 VIEW COMPARISON:  Chest radiograph dated 06/04/2021. CT chest dated 05/25/2021. FINDINGS: Medial left upper lobe nodule, better visualized on prior CT. No focal consolidation.  No pleural effusion or pneumothorax. The heart is normal in size.  Thoracic aortic atherosclerosis. IMPRESSION: Medial left upper lobe nodule, better visualized on prior CT. No evidence of acute cardiopulmonary disease. Electronically Signed   By: Julian Hy M.D.   On: 06/24/2021 15:42    Procedures Procedures   Medications Ordered in ED Medications  lactated ringers bolus 1,000 mL (0 mLs Intravenous Stopped 05/31/2021 1652)  lactated ringers bolus 1,000 mL (0 mLs Intravenous Stopped 05/28/2021 1821)  cefTRIAXone (ROCEPHIN) 1 g in sodium chloride 0.9 % 100 mL IVPB (0 g Intravenous Stopped 05/29/2021 1652)  lactated ringers bolus 1,000 mL (0 mLs Intravenous Stopped 06/04/2021 1952)  lactated ringers bolus 1,000 mL (1,000 mLs Intravenous New Bag/Given 06/02/2021 2007)    ED Course  I have reviewed the triage vital signs and the nursing notes.  Pertinent labs & imaging results that were  available during my care of the patient were reviewed by me and considered in my medical decision making (see chart for details).    MDM Rules/Calculators/A&P                           Pt was found to be hypothermic, so she was placed on a bair hugger.  She is hypotensive, so she is started on fluids.   Pt has AKI.  Cr today is 3.91.  Cr on 7/13 was 0.71.   Pt given 3L of LR.  Due to possibility for UTI, she was given rocephin.  UA looks ok, but was sent for culture.  Pt's CT head reviewed with NS.  Dr. Trenton Gammon reviewed the films and feel the CT looks ok and she can stay here.  Pt is acidotic.  PCO2 is in the mid-60s.  Pt was started on bipap which has improved MS, but it has not improved the acidosis.  Pt's daughter wants to hold off on intubation to see if her MS continues to improve with hydration.  She is Covid neg.  BP is dropping again and not responding to fluids.  Her MS is worsening as well.  I spoke again to her  daughter and son who want to make her comfort care only.  Her daughter said she asked pt if she wanted to be intubated and pt said no.  Pt told her daughter that she wanted to see her dad.  Pt is made comfort care only.  Pt d/w Dr. Clearence Ped (triad) for admission.  Ardine Iacovelli was evaluated in Emergency Department on 06/21/2021 for the symptoms described in the history of present illness. She was evaluated in the context of the global COVID-19 pandemic, which necessitated consideration that the patient might be at risk for infection with the SARS-CoV-2 virus that causes COVID-19. Institutional protocols and algorithms that pertain to the evaluation of patients at risk for COVID-19 are in a state of rapid change based on information released by regulatory bodies including the CDC and federal and state organizations. These policies and algorithms were followed during the patient's care in the ED.   CRITICAL CARE Performed by: Isla Pence   Total critical care time:  90 minutes  Critical care time was exclusive of separately billable procedures and treating other patients.  Critical care was necessary to treat or prevent imminent or life-threatening deterioration.  Critical care was time spent personally by me on the following activities: development of treatment plan with patient and/or surrogate as well as nursing, discussions with consultants, evaluation of patient's response to treatment, examination of patient, obtaining history from patient or surrogate, ordering and performing treatments and interventions, ordering and review of laboratory studies, ordering and review of radiographic studies, pulse oximetry and re-evaluation of patient's condition.   Final Clinical Impression(s) / ED Diagnoses Final diagnoses:  Acute respiratory failure with hypoxia and hypercapnia ([place])  AKI (acute kidney injury) ([place])  Acute metabolic encephalopathy    Rx / DC Orders ED Discharge Orders     None        Isla Pence, MD 06/19/2021 2118

## 2021-06-17 NOTE — ED Notes (Signed)
Family remains at bedside. Pt appears restless at this time, will give medication for same. Will continue to monitor pt.

## 2021-06-17 NOTE — ED Notes (Signed)
Nurse spoke with Roselyn Reef respiratory therapist about transporting to CT. RT stated pt could come off bipap [place] Humbarger enough for the CT and use 4 LPM.

## 2021-06-17 NOTE — ED Notes (Signed)
Nurse called Page Georgann Housekeeper as requested for update.

## 2021-06-17 NOTE — ED Notes (Signed)
Pt removed from BiPap and bair hugger at this time. Pt placed on 4 LPM via [place] for comfort. Temp is within normal range at this time. PureWick also placed at this time. Family remains at the bedside.

## 2021-06-17 NOTE — ED Notes (Signed)
Pt appears more difficult to arouse than previously in the shift. Dr. Gilford Raid informed of same. Family is at the bedside.

## 2021-06-17 NOTE — ED Triage Notes (Signed)
Pt arrived from [facility]. Pt c/o of increased SOB today. Pt has COPD and wears 4L of O2 chronic. Pt received  neb tx & solumedrol today @ [facility] w/ no relief per nursing staff. Pt was sent to ED for further evaluation and a chest xray. Pt had brian sx a few weeks ok for a abscess.

## 2021-06-18 DIAGNOSIS — G9341 Metabolic encephalopathy: Secondary | ICD-10-CM | POA: Diagnosis not present

## 2021-06-18 DIAGNOSIS — J9601 Acute respiratory failure with hypoxia: Secondary | ICD-10-CM | POA: Diagnosis not present

## 2021-06-18 DIAGNOSIS — Z515 Encounter for palliative care: Secondary | ICD-10-CM | POA: Diagnosis not present

## 2021-06-18 DIAGNOSIS — N179 Acute kidney failure, unspecified: Secondary | ICD-10-CM | POA: Diagnosis not present

## 2021-06-18 MED ORDER — IPRATROPIUM-ALBUTEROL 0.5-2.5 (3) MG/3ML IN SOLN: 3.0000 mL | Freq: Four times a day (QID) | RESPIRATORY_TRACT | Status: DC | PRN

## 2021-06-18 NOTE — Progress Notes (Signed)
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  She is brought back to the hospital with worsening mental status.  She is noted to have acute on chronic respiratory failure with hypercapnia and hypoxia.  Noted to have metabolic encephalopathy, acute kidney injury.  She was hypothermic and hypotensive.  After discussing with family, they have elected to pursue comfort measures.   Assessment & Plan:   Active Problems:   Comfort measures only status   Acute metabolic encephalopathy -Related to renal failure and hypercapnia -Patient currently unresponsive  Acute kidney injury -Baseline creatinine 0.7 on last discharge -Presented with creatinine of 3.9  Acute on chronic respiratory failure with hypoxia and hypercapnia/respiratory acidosis -Patient has history of emphysema -Noted to have a PCO2 of 64.7, pH of 7.17 -Briefly tried on BiPAP without improvement of blood gas  SIRS -Patient does not have any clear source of infection -She did have hypotension, hypothermia, tachycardia, leukocytosis, renal failure   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  There were changes consistent with recent surgery  Goals of care -After discussing with ER physician/admitting physician, family has elected to transition to comfort measures -Currently she remains hypotensive, she is unresponsive -Expected prognosis is hours to days, anticipate in-hospital death   DVT prophylaxis:   None  Code Status: DNR, comfort measures Family Communication: Updated family at the bedside Disposition Plan:  Status is: Inpatient  Remains inpatient appropriate because:IV treatments appropriate due to intensity of illness or inability to take PO  Dispo: The patient is from: SNF              Anticipated d/c is to:  Anticipate in-hospital death              Patient currently is not medically stable to d/c.   Difficult to place patient No         Consultants:    Procedures:    Antimicrobials:      Subjective: Unresponsive  Objective: Vitals:   06/18/21 1300 06/18/21 1500 06/18/21 1600 06/18/21 1638  BP: (!) 89/36 (!) 93/39 (!) 93/37 (!) 109/25  Pulse: 85 87 86   Resp: 17 17 16    Temp:    (!) 96.8 F (36 C)  TempSrc:    Axillary  SpO2: 99% 100% 100% 100%  Weight:    69.5 kg  Height:        Intake/Output Summary (Last 24 hours) at 06/18/2021 1905 Last data filed at 06/07/2021 2227 Gross per 24 hour  Intake 2000 ml  Output --  Net 2000 ml   Filed Weights   06/04/2021 1415 06/18/21 1638  Weight: 68.9 kg 69.5 kg    Examination:  General exam: Appears calm and comfortable, unresponsive Respiratory system: Bilateral rhonchi. Respiratory effort normal. Cardiovascular system: S1 & S2 heard, RRR. No JVD, murmurs, rubs, gallops or clicks. No pedal edema.     Data Reviewed: I have personally reviewed following labs and imaging studies  CBC: Recent Labs  Lab 06/03/2021 0814 06/19/2021 1450  WBC 10.4 13.4*  NEUTROABS 8.9* 12.2*  HGB 10.3* 10.6*  HCT 34.8* 35.8*  MCV 98.6  99.4  PLT 210 623   Basic Metabolic Panel: Recent Labs  Lab 06/03/2021 0814 06/18/2021 1450  NA 136 137  K 4.6 5.0  CL 97* 99  CO2 27 25  GLUCOSE 136* 212*  BUN 88* 99*  CREATININE 3.40* 3.91*  CALCIUM 9.1 9.2  MG 2.4  --   PHOS 7.8*  --    GFR: Estimated Creatinine Clearance: 11.5 mL/min (A) (by C-G formula based on SCr of 3.91 mg/dL (H)). Liver Function Tests: Recent Labs  Lab 06/11/2021 0814 06/25/2021 1450  AST 11* 13*  ALT 18 19  ALKPHOS 44 47  BILITOT 0.4 0.5  PROT 5.9* 6.0*   ALBUMIN 3.4* 3.4*   No results for input(s): LIPASE, AMYLASE in the last 168 hours. No results for input(s): AMMONIA in the last 168 hours. Coagulation Profile: Recent Labs  Lab 06/24/2021 1450  INR 1.0   Cardiac Enzymes: No results for input(s): CKTOTAL, CKMB, CKMBINDEX, TROPONINI in the last 168 hours. BNP (last 3 results) No results for input(s): PROBNP in the last 8760 hours. HbA1C: Recent Labs    06/09/2021 0815  HGBA1C 6.5*   CBG: No results for input(s): GLUCAP in the last 168 hours. Lipid Profile: No results for input(s): CHOL, HDL, LDLCALC, TRIG, CHOLHDL, LDLDIRECT in the last 72 hours. Thyroid Function Tests: No results for input(s): TSH, T4TOTAL, FREET4, T3FREE, THYROIDAB in the last 72 hours. Anemia Panel: No results for input(s): VITAMINB12, FOLATE, FERRITIN, TIBC, IRON, RETICCTPCT in the last 72 hours. Sepsis Labs: Recent Labs  Lab 06/09/2021 1450 06/26/2021 1733  LATICACIDVEN 1.1 1.8    Recent Results (from the past 240 hour(s))  Resp Panel by RT-PCR (Flu A&B, Covid) Nasopharyngeal Swab     Status: None   Collection Time: 06/13/2021  3:03 PM   Specimen: Nasopharyngeal Swab; Nasopharyngeal(NP) swabs in vial transport medium  Result Value Ref Range Status   SARS Coronavirus 2 by RT PCR NEGATIVE NEGATIVE Final    Comment: (NOTE) SARS-CoV-2 target nucleic acids are NOT DETECTED.  The SARS-CoV-2 RNA is generally detectable in upper respiratory specimens during the acute phase of infection. The lowest concentration of SARS-CoV-2 viral copies this assay can detect is 138 copies/mL. A negative result does not preclude SARS-Cov-2 infection and should not be used as the sole basis for treatment or other patient management decisions. A negative result may occur with  improper specimen collection/handling, submission of specimen other than nasopharyngeal swab, presence of viral mutation(s) within the areas targeted by this assay, and inadequate number of  viral copies(<138 copies/mL). A negative result must be combined with clinical observations, patient history, and epidemiological information. The expected result is Negative.  Fact Sheet for Patients:  EntrepreneurPulse.com.au  Fact Sheet for Healthcare Providers:  IncredibleEmployment.be  This test is no t yet approved or cleared by the [place] FDA and  has been authorized for detection and/or diagnosis of SARS-CoV-2 by FDA under an Emergency Use Authorization (EUA). This EUA will remain  in effect (meaning this test can be used) for the duration of the COVID-19 declaration under Section 564(b)(1) of the Act, 21 U.S.C.section 360bbb-3(b)(1), unless the authorization is terminated  or revoked sooner.       Influenza A by PCR NEGATIVE NEGATIVE Final   Influenza B by PCR NEGATIVE NEGATIVE Final    Comment: (NOTE) The Xpert Xpress SARS-CoV-2/FLU/RSV plus assay is intended as an aid in the diagnosis of influenza from Nasopharyngeal swab specimens and should not be used as a sole basis for treatment.  Nasal washings and aspirates are unacceptable for Xpert Xpress SARS-CoV-2/FLU/RSV testing.  Fact Sheet for Patients: EntrepreneurPulse.com.au  Fact Sheet for Healthcare Providers: IncredibleEmployment.be  This test is not yet approved or cleared by the [place] FDA and has been authorized for detection and/or diagnosis of SARS-CoV-2 by FDA under an Emergency Use Authorization (EUA). This EUA will remain in effect (meaning this test can be used) for the duration of the COVID-19 declaration under Section 564(b)(1) of the Act, 21 U.S.C. section 360bbb-3(b)(1), unless the authorization is terminated or revoked.  Performed at [facility], [address] [place], [place] [address]   Blood Culture (routine x 2)     Status: None (Preliminary result)   Collection Time: 06/19/2021  4:05 PM   Specimen: Left  Antecubital; Blood  Result Value Ref Range Status   Specimen Description   Final    LEFT ANTECUBITAL BOTTLES DRAWN AEROBIC AND ANAEROBIC   Special Requests Blood Culture adequate volume  Final   Culture   Final    NO GROWTH < 12 HOURS Performed at [facility], [address], [place], [place] [address]    Report Status PENDING  Incomplete  Blood Culture (routine x 2)     Status: None (Preliminary result)   Collection Time: 06/07/2021  4:05 PM   Specimen: BLOOD RIGHT HAND  Result Value Ref Range Status   Specimen Description   Final    BLOOD RIGHT HAND BOTTLES DRAWN AEROBIC AND ANAEROBIC   Special Requests Blood Culture adequate volume  Final   Culture   Final    NO GROWTH < 12 HOURS Performed at [facility], [address], [place], [place] [address]    Report Status PENDING  Incomplete         Radiology Studies: CT Head Wo Contrast  Result Date: 06/05/2021 CLINICAL DATA:  Mental status change, unknown cause EXAM: CT HEAD WITHOUT CONTRAST TECHNIQUE: Contiguous axial images were obtained from the base of the skull through the vertex without intravenous contrast. COMPARISON:  Head CT 06/03/2021, brain MRI 06/05/2021 FINDINGS: Brain: Recent right frontal craniotomy for mass resection. Right frontal edema in the resection cavity without significant bleed. Slight residual edema and frontal midline shift. No new hemorrhage. No evidence of acute ischemia. No subdural or extra-axial collection. No hydrocephalus. Vascular: Atherosclerosis of skullbase vasculature without hyperdense vessel or abnormal calcification. Skull: Right frontal craniotomy. Sinuses/Orbits: Minimal right mastoid air cells. No paranasal sinus inflammation. Other: None. IMPRESSION: 1. Recent right frontal craniotomy for mass resection. Right frontal edema in the resection cavity without significant bleed. Slight residual edema and frontal midline shift. 2. No new abnormality. Electronically Signed   By: Keith Rake M.D.    On: 06/04/2021 18:18   DG Chest Port 1 View  Result Date: 06/19/2021 CLINICAL DATA:  Shortness of breath, COVID test pending EXAM: PORTABLE CHEST 1 VIEW COMPARISON:  Chest radiograph dated 06/04/2021. CT chest dated 05/25/2021. FINDINGS: Medial left upper lobe nodule, better visualized on prior CT. No focal consolidation.  No pleural effusion or pneumothorax. The heart is normal in size.  Thoracic aortic atherosclerosis. IMPRESSION: Medial left upper lobe nodule, better visualized on prior CT. No evidence of acute cardiopulmonary disease. Electronically Signed   By: Julian Hy M.D.   On: 06/22/2021 15:42        Scheduled Meds:  ipratropium-albuterol  3 mL Nebulization Q6H   Continuous Infusions:  sodium chloride 10 mL/hr at 06/18/21 1653   chlorproMAZINE (THORAZINE) IV  LOS: 1 day    Time spent: 39mins    Kathie Dike, MD [facility]   If 7PM-7AM, please contact night-coverage [email or web address]  06/18/2021, 7:05 PM

## 2021-06-18 NOTE — ED Notes (Signed)
Family at bedside. 

## 2021-06-18 NOTE — ED Notes (Signed)
Pt appears to be resting comfortably at this time. Family remains at bedside.

## 2021-06-19 DIAGNOSIS — J9601 Acute respiratory failure with hypoxia: Secondary | ICD-10-CM | POA: Diagnosis not present

## 2021-06-19 DIAGNOSIS — G9341 Metabolic encephalopathy: Secondary | ICD-10-CM

## 2021-06-19 DIAGNOSIS — N179 Acute kidney failure, unspecified: Secondary | ICD-10-CM | POA: Diagnosis not present

## 2021-06-19 DIAGNOSIS — Z515 Encounter for palliative care: Secondary | ICD-10-CM | POA: Diagnosis not present

## 2021-06-19 DIAGNOSIS — J9602 Acute respiratory failure with hypercapnia: Secondary | ICD-10-CM

## 2021-06-19 NOTE — Progress Notes (Signed)
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  She is brought back to the hospital with worsening mental status.  She is noted to have acute on chronic respiratory failure with hypercapnia and hypoxia.  Noted to have metabolic encephalopathy, acute kidney injury.  She was hypothermic and hypotensive.  After discussing with family, they have elected to pursue comfort measures.   Assessment & Plan:   Active Problems:   Comfort measures only status   Acute metabolic encephalopathy -Related to renal failure and hypercapnia -Patient currently unresponsive -Will continue focusing on comfort care and symptomatic management. -Hospital death expected.  Acute kidney injury -Baseline creatinine 0.7 on last discharge -Presented with creatinine of 3.9 -Not a candidate for dialysis; patient's family wanting to pursued comfort care only.  Acute on chronic respiratory failure with hypoxia and hypercapnia/respiratory acidosis -Patient has history of emphysema -Noted to have a PCO2 of 64.7, pH of 7.17 -Briefly BiPAP trial given at time of admission; no changes or improvement appreciated. -After further discussions with family member decision has been made to keep patient comfortable and minimize any invasive treatment. -Hospital death anticipated.  SIRS -Patient does not have any clear source of infection currently. -She did have hypotension, hypothermia, tachycardia, leukocytosis, renal failure -We will continue focusing on comfort care and symptomatic management. -No antibiotics will be provided empirically.  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  There were changes consistent with recent surgery  Goals of care -Full comfort care, no resuscitation, no invasive interventions. -Continue symptomatic management. -Hospital death anticipated -Very poor prognosis.  DVT prophylaxis:   None  Code Status: DNR, comfort measures Family Communication: Updated family at the bedside 7/23 Disposition Plan: Status is: Inpatient  Remains inpatient appropriate because:IV treatments appropriate due to intensity of illness or inability to take PO  Dispo: The patient is from: SNF              Anticipated d/c is to:  Anticipate in-hospital death  Vs if plateau and stable for transfer hospice facility.              Patient currently is not medically stable to d/c.   Difficult to place patient No   Consultants:  Hospice  Procedures:  See below for x-ray reports  Antimicrobials:   None   Subjective: No eating, not following commands, completely unresponsive/obtunded.  Objective: Vitals:   06/19/21 1400 06/19/21 1500 06/19/21 1600 06/19/21 1700  BP:    (!) 78/43  Pulse:      Resp: 12 13 11 14   Temp:      TempSrc:      SpO2:      Weight:      Height:        Intake/Output Summary (Last 24 hours) at 06/19/2021 1748 Last data filed at 06/19/2021 1544 Gross per 24 hour  Intake 1628.67 ml  Output --  Net 1628.67 ml   Filed Weights   06/09/2021 1415 06/18/21 1638  Weight: 68.9 kg 69.5 kg    Examination: General exam: Afebrile, unresponsive and not following commands.  Intermittent mild agonal breath appreciated on examination.  Per family at bedside they had not noticed in between medications for comfort fussiness and restlessness. Respiratory system: No wheezing, no crackles; positive rhonchi bilaterally.  No using accessory muscles.  Respiratory rate 13 to 14/min. Cardiovascular system: S1 and S2, no rubs, no gallops, no JVD. Gastrointestinal system: Abdomen is  nondistended, soft and nontender. No organomegaly or masses felt. Normal bowel sounds heard. Central nervous system: Completely unresponsive and not following commands. Extremities: No cyanosis or clubbing. Skin: No rashes, no petechiae. Psychiatry: unable to assess given current mentation.     Data Reviewed: I have personally reviewed following labs and imaging studies  CBC: Recent Labs  Lab 06/27/2021 0814 06/06/2021 1450  WBC 10.4 13.4*  NEUTROABS 8.9* 12.2*  HGB 10.3* 10.6*  HCT 34.8* 35.8*  MCV 98.6 99.4  PLT 210 161   Basic Metabolic Panel: Recent Labs  Lab 06/04/2021 0814 06/16/2021 1450  NA 136 137  K 4.6 5.0  CL 97* 99  CO2 27 25  GLUCOSE 136* 212*  BUN 88* 99*  CREATININE 3.40* 3.91*  CALCIUM 9.1 9.2  MG 2.4  --   PHOS 7.8*  --    GFR: Estimated Creatinine Clearance: 11.5 mL/min (A) (by C-G formula based on SCr of 3.91 mg/dL (H)). Liver Function Tests: Recent Labs  Lab 06/08/2021 0814 06/14/2021 1450  AST 11* 13*  ALT 18 19  ALKPHOS 44 47  BILITOT 0.4 0.5  PROT 5.9* 6.0*  ALBUMIN 3.4* 3.4*   Coagulation Profile: Recent Labs  Lab 06/16/2021 1450  INR 1.0   HbA1C: Recent Labs    06/05/2021 0815  HGBA1C 6.5*   Sepsis Labs: Recent Labs  Lab 06/22/2021 1450 06/01/2021 1733  LATICACIDVEN 1.1 1.8    Recent Results (from the past 240 hour(s))  Urine Culture     Status: Abnormal (Preliminary result)   Collection Time: 06/12/2021  3:02 PM   Specimen: In/Out Cath Urine  Result Value Ref Range Status   Specimen Description   Final    IN/OUT CATH URINE Performed at [facility], [address], [place], [place] [address]    Special Requests   Final    NONE Performed at [facility], [address], [place], [place] [address]    Culture (A)  Final    2,000 COLONIES/mL ESCHERICHIA COLI 500 COLONIES/mL KLEBSIELLA OXYTOCA SUSCEPTIBILITIES TO FOLLOW Performed at [facility], [place] [address] [place], [place] [address]    Report Status PENDING   Incomplete  Resp Panel by RT-PCR (Flu A&B, Covid) Nasopharyngeal Swab     Status: None   Collection Time: 06/12/2021  3:03 PM   Specimen: Nasopharyngeal Swab; Nasopharyngeal(NP) swabs in vial transport medium  Result Value Ref Range Status   SARS Coronavirus 2 by RT PCR NEGATIVE NEGATIVE Final    Comment: (NOTE) SARS-CoV-2 target nucleic acids are NOT DETECTED.  The SARS-CoV-2 RNA is generally detectable in upper respiratory specimens during the acute phase of infection. The lowest concentration of SARS-CoV-2 viral copies this assay can detect is 138 copies/mL. A negative result does not preclude SARS-Cov-2 infection and should not be used as the sole basis for treatment or other patient management decisions. A negative result may occur with  improper specimen collection/handling, submission of specimen other than nasopharyngeal swab, presence of viral mutation(s) within the areas targeted by this assay, and inadequate number of viral copies(<138 copies/mL). A negative result must be combined with clinical observations, patient history, and epidemiological information. The expected result is Negative.  Fact Sheet  for Patients:  EntrepreneurPulse.com.au  Fact Sheet for Healthcare Providers:  IncredibleEmployment.be  This test is no t yet approved or cleared by the [place] FDA and  has been authorized for detection and/or diagnosis of SARS-CoV-2 by FDA under an Emergency Use Authorization (EUA). This EUA will remain  in effect (meaning this test can be used) for the duration of the COVID-19 declaration under Section 564(b)(1) of the Act, 21 U.S.C.section 360bbb-3(b)(1), unless the authorization is terminated  or revoked sooner.       Influenza A by PCR NEGATIVE NEGATIVE Final   Influenza B by PCR NEGATIVE NEGATIVE Final    Comment: (NOTE) The Xpert Xpress SARS-CoV-2/FLU/RSV plus assay is intended as an aid in the diagnosis of influenza  from Nasopharyngeal swab specimens and should not be used as a sole basis for treatment. Nasal washings and aspirates are unacceptable for Xpert Xpress SARS-CoV-2/FLU/RSV testing.  Fact Sheet for Patients: EntrepreneurPulse.com.au  Fact Sheet for Healthcare Providers: IncredibleEmployment.be  This test is not yet approved or cleared by the [place] FDA and has been authorized for detection and/or diagnosis of SARS-CoV-2 by FDA under an Emergency Use Authorization (EUA). This EUA will remain in effect (meaning this test can be used) for the duration of the COVID-19 declaration under Section 564(b)(1) of the Act, 21 U.S.C. section 360bbb-3(b)(1), unless the authorization is terminated or revoked.  Performed at [facility], [address] [place], [place] [address]   Blood Culture (routine x 2)     Status: None (Preliminary result)   Collection Time: 06/06/2021  4:05 PM   Specimen: Left Antecubital; Blood  Result Value Ref Range Status   Specimen Description   Final    LEFT ANTECUBITAL BOTTLES DRAWN AEROBIC AND ANAEROBIC   Special Requests Blood Culture adequate volume  Final   Culture   Final    NO GROWTH 2 DAYS Performed at [facility], [address] [place], [place] [address]    Report Status PENDING  Incomplete  Blood Culture (routine x 2)     Status: None (Preliminary result)   Collection Time: 06/13/2021  4:05 PM   Specimen: BLOOD RIGHT HAND  Result Value Ref Range Status   Specimen Description   Final    BLOOD RIGHT HAND BOTTLES DRAWN AEROBIC AND ANAEROBIC   Special Requests Blood Culture adequate volume  Final   Culture   Final    NO GROWTH 2 DAYS Performed at [facility], [address] [place], [place] [address]    Report Status PENDING  Incomplete     Radiology Studies: CT Head Wo Contrast  Result Date: 05/28/2021 CLINICAL DATA:  Mental status change, unknown cause EXAM: CT HEAD WITHOUT CONTRAST TECHNIQUE: Contiguous  axial images were obtained from the base of the skull through the vertex without intravenous contrast. COMPARISON:  Head CT 06/03/2021, brain MRI 06/05/2021 FINDINGS: Brain: Recent right frontal craniotomy for mass resection. Right frontal edema in the resection cavity without significant bleed. Slight residual edema and frontal midline shift. No new hemorrhage. No evidence of acute ischemia. No subdural or extra-axial collection. No hydrocephalus. Vascular: Atherosclerosis of skullbase vasculature without hyperdense vessel or abnormal calcification. Skull: Right frontal craniotomy. Sinuses/Orbits: Minimal right mastoid air cells. No paranasal sinus inflammation. Other: None. IMPRESSION: 1. Recent right frontal craniotomy for mass resection. Right frontal edema in the resection cavity without significant bleed. Slight residual edema and frontal midline shift. 2. No new abnormality. Electronically Signed   By: Keith Rake M.D.   On: 06/09/2021  18:18     Scheduled Meds:   Continuous Infusions:  sodium chloride 10 mL/hr at 06/19/21 1544   chlorproMAZINE (THORAZINE) IV       LOS: 2 days    Time spent: 25 mins    Barton Dubois, MD [facility]   If 7PM-7AM, please contact night-coverage [email or web address]  06/19/2021, 5:48 PM

## 2021-06-20 DIAGNOSIS — N1832 Chronic kidney disease, stage 3b: Secondary | ICD-10-CM

## 2021-06-20 LAB — URINE CULTURE: Culture: 2000 — AB

## 2021-06-21 ENCOUNTER — Inpatient Hospital Stay (HOSPITAL_COMMUNITY): Payer: Medicare Other | Admitting: Hematology

## 2021-06-22 LAB — CULTURE, BLOOD (ROUTINE X 2)
Culture: NO GROWTH
Culture: NO GROWTH
Special Requests: ADEQUATE
Special Requests: ADEQUATE

## 2021-06-28 NOTE — Progress Notes (Signed)
Patient found by staff with no pulse and no respirations. Patient expirations verified by RN and LPN. Family at bedside. MD to be notified. Will provide support to family as needed.

## 2021-06-28 NOTE — Death Summary Note (Signed)
DEATH SUMMARY   Patient Details  Name: Barbara Gibson MRN: [id number] DOB: Jul 17, 1946  Admission/Discharge Information   Admit Date:  2021-06-27  Date of Death: Date of Death: 30-Jun-2021  Time of Death: Time of Death: 0224  Length of Stay: 3  Referring Physician: [name], MD   Reason(s) for Hospitalization  Dyspnea, hypoxia, anorexia and acute encephalopathy.  Diagnoses  Preliminary cause of death: Acute respiratory failure with hypoxia/hypercapnia. 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 Hypertension  Breast cancer with brain metastasis Chronic respiratory failure using 2-3 L nasal cannula supplementation 24/7.   Hyperlipidemia History of COPD Anxiety/depression Gastroesophageal flux disease.   Brief Hospital Course (including significant findings, care, treatment, and services provided and events leading to death)   Barbara Gibson  is a 75 y.o. female, with medical history significant for anxiety, depression, COPD, CAD, former smoker, breast cancer s/p lumpectomy, brain mass s/p resection earlier this month presents to the ED with a chief complaint of dyspnea, hypoxia, decreased PO intake, and altered mental status. Patient is, unfortunately not able to provide any history 2/2 to altered mental status.  ED provider spoke with facility reports the patient had been doing fine up until today.  Accuracy of that statement is unclear as she has had a big jump in creatinine which is likely secondary to poor p.o. intake for several days.  She has been at the facility for about a week.  In the ED patient was found to have the significant AKI with a creatinine of 0.7 at discharge and 3.91 today.  She had CO2 retention with a PCO2 of 64.7, she is acidotic with a pH of 7.176, she was given 3 L of LR, and Rocephin.  She had had the brain resection at the last admission so CT head was  repeated that showed right frontal edema and resection cavity, but no other acute findings.  These findings were discussed by ED provider with neurosurgery who reported the patient could stay here at [facility].  Patient had persistent episodes of hypotension while in the ED as well.  Boluses would improve blood pressure, but then the blood pressure would immediately go back down when bolus was finished.  At time of admission patient's blood pressure was 82/35.  I advised that patient would have to be transferred to [facility] and would likely need ICU and had requested that the ED provider spoke with critical care at [facility].  In that time several family members came to the hospital and decided to make patient comfort measures only.  I discussed with family that we will stop BiPAP but continue nasal cannula, we will continue fluids, we will of course continue antianxiety and antipain medications.  At this time we will stop antibiotics, blood draws, and will allow patient comfort feeds if she becomes alert.  It is reported that in the ED she has had intermittent alertness.  At the time of my exam patient is somnolent, arouses to touch, but then immediately goes back to sleep.  Family understands and agrees with plan.   Chart review: Patient recently discharged from hospital on July 13 to SNF.  Patient presented to the hospital for generalized weakness that has been ongoing for 3 days.  She had a brain mass and neurosurgery was consulted.  She underwent resection on June 04, 2021 and Decadron was started.  She was transferred to the ICU and neurosurgery service after  her brain mass resection.  She was transferred back to hospitalist service after she was deemed stable by neurosurgery.  Medical oncology was planning for PET scan and postresection radiation.  Patient was discharged to the skilled nursing facility.  Hospital course by problem:  Acute metabolic encephalopathy -Related to renal failure and  hypercapnia -Patient currently unresponsive -Will continue focusing on comfort care and symptomatic management. -Hospital death was expected. -Patient passed away on 06-25-21 at 2:24 in the morning.   Acute kidney injury -Baseline creatinine 0.7 on last discharge -Presented with creatinine of 3.9 -Not a candidate for dialysis; patient's family wanting to pursued comfort care only.   Acute on chronic respiratory failure with hypoxia and hypercapnia/respiratory acidosis -Patient has history of emphysema -Noted to have a PCO2 of 64.7, pH of 7.17 -Briefly BiPAP trial given at time of admission; no changes or improvement appreciated. -After further discussions with family member decision has been made to keep patient comfortable and minimize any invasive treatment. -Hospital death anticipated.   SIRS -Patient does not have any clear source of infection currently. -She did have hypotension, hypothermia, tachycardia, leukocytosis, renal failure -We will continue focusing on comfort care and symptomatic management. -No antibiotics will be provided empirically.   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  There were changes consistent with recent surgery   Goals of care -Full comfort care, no resuscitation, no invasive interventions. -Continue symptomatic management. -Hospital death anticipated -Patient prognosis was poor and hospital death expected; family was grateful of care provided. -Patient peacefully passed away on Jun 25, 2021 at 2:24 am    Pertinent Labs and Studies  Significant Diagnostic Studies DG Chest 2 View  Result Date: 05/25/2021 CLINICAL DATA:  Weakness. EXAM: CHEST - 2 VIEW COMPARISON:  Chest x-ray report 03/05/2016. FINDINGS: Mediastinum and hilar structures normal. Heart size normal. Low lung volumes with mild bibasilar atelectasis.  Mild left base infiltrate cannot be excluded. No pleural effusion or pneumothorax. IMPRESSION: Low lung volumes with mild bibasilar atelectasis. Mild left base infiltrate cannot be excluded. Electronically Signed   By: Marcello Moores  Register   On: 05/25/2021 06:49   CT Head Wo Contrast  Result Date: 06/13/2021 CLINICAL DATA:  Mental status change, unknown cause EXAM: CT HEAD WITHOUT CONTRAST TECHNIQUE: Contiguous axial images were obtained from the base of the skull through the vertex without intravenous contrast. COMPARISON:  Head CT 06/03/2021, brain MRI 06/05/2021 FINDINGS: Brain: Recent right frontal craniotomy for mass resection. Right frontal edema in the resection cavity without significant bleed. Slight residual edema and frontal midline shift. No new hemorrhage. No evidence of acute ischemia. No subdural or extra-axial collection. No hydrocephalus. Vascular: Atherosclerosis of skullbase vasculature without hyperdense vessel or abnormal calcification. Skull: Right frontal craniotomy. Sinuses/Orbits: Minimal right mastoid air cells. No paranasal sinus inflammation. Other: None. IMPRESSION: 1. Recent right frontal craniotomy for mass resection. Right frontal edema in the resection cavity without significant bleed. Slight residual edema and frontal midline shift. 2. No new abnormality. Electronically Signed   By: Keith Rake M.D.   On: 06/11/2021 18:18   MR BRAIN WO CONTRAST  Result Date: 05/25/2021 CLINICAL DATA:  Delirium, prior history of DCIS left breast EXAM: MRI HEAD WITHOUT CONTRAST TECHNIQUE: Multiplanar, multiecho pulse sequences of the brain and surrounding structures were obtained without intravenous contrast. COMPARISON:  None. FINDINGS: Brain: There is a 2.7 x 2.7 cm rounded mass  of the anteroinferior right frontal lobe with extensive surrounding edema. There is regional mass effect including leftward midline shift with subfalcine herniation measuring 1 cm. No significant central herniation.  No evidence of acute infarction or hemorrhage. Additional patchy T2 hyperintensity in the supratentorial and pontine white matter is nonspecific but probably reflects chronic microvascular ischemic changes. There is no extra-axial collection. There is no hydrocephalus. Vascular: Major vessel flow voids at the skull base are preserved. Skull and upper cervical spine: Normal marrow signal is preserved. Sinuses/Orbits: Minor mucosal thickening.  Orbits are unremarkable. Other: Sella is unremarkable.  Mastoid air cells are clear. IMPRESSION: 2.7 cm anterior inferior right frontal lobe mass with extensive surrounding edema. Metastasis is the primary differential consideration. There is 1 cm of leftward midline shift. No hydrocephalus. Postcontrast imaging is recommended to evaluate for additional lesions. These results will be called to the ordering clinician or representative by the Radiologist Assistant, and communication documented in the PACS or [employer]. Electronically Signed   By: Macy Mis M.D.   On: 05/25/2021 12:27   MR BRAIN W WO CONTRAST  Result Date: 06/05/2021 CLINICAL DATA:  Postop craniotomy EXAM: MRI HEAD WITHOUT AND WITH CONTRAST TECHNIQUE: Multiplanar, multiecho pulse sequences of the brain and surrounding structures were obtained without and with intravenous contrast. CONTRAST:  7.30mL GADAVIST GADOBUTROL 1 MMOL/ML IV SOLN COMPARISON:  Brain MRI 05/25/2021 FINDINGS: Brain: Right frontal mass resection with blood products in the cavity and adjacent extra-axial space. Wispy peripheral enhancement at the cavity, likely postoperative. Extensive but already diminished vasogenic edema. The anterior midline is displaced to the left by 6 mm. No hydrocephalus. No complicating infarct. No newly seen metastatic lesions. Vascular: Preserved flow voids and vascular enhancements. Skull and upper cervical spine: Unremarkable craniotomy Sinuses/Orbits: Negative IMPRESSION: Right frontal mass resection  without adverse feature. Resection cavity enhancement is wispy and likely postoperative. Electronically Signed   By: Monte Fantasia M.D.   On: 06/05/2021 05:46   MR BRAIN W WO CONTRAST  Result Date: 05/26/2021 CLINICAL DATA:  Intracranial metastatic disease. Generalized weakness. EXAM: MRI HEAD WITHOUT AND WITH CONTRAST TECHNIQUE: Multiplanar, multiecho pulse sequences of the brain and surrounding structures were obtained without and with intravenous contrast. CONTRAST:  7.72mL GADAVIST GADOBUTROL 1 MMOL/ML IV SOLN COMPARISON:  Brain MRI 05/25/2021 at 11:06 a.m. FINDINGS: Brain: Unchanged appearance of right frontal lesion with large amount of surrounding edema. Unchanged leftward midline shift of 8 mm. Right frontal mass shows heterogeneous contrast enhancement and measures 3.2 x 2.6 cm. There are no other contrast-enhancing lesions. There is no acute or chronic hemorrhage. Normal white matter signal, parenchymal volume and CSF spaces. The midline structures are normal. Vascular: Major flow voids are preserved. Skull and upper cervical spine: Normal calvarium and skull base. Visualized upper cervical spine and soft tissues are normal. Sinuses/Orbits:No paranasal sinus fluid levels or advanced mucosal thickening. No mastoid or middle ear effusion. Normal orbits. IMPRESSION: 1. Right frontal mass measuring 3.2 x 2.6 cm, with large amount of surrounding edema and 8 mm leftward midline shift. 2. No other metastatic lesions. Electronically Signed   By: Ulyses Jarred M.D.   On: 05/26/2021 00:04   DG Chest Port 1 View  Result Date: 06/02/2021 CLINICAL DATA:  Shortness of breath, COVID test pending EXAM: PORTABLE CHEST 1 VIEW COMPARISON:  Chest radiograph dated 06/04/2021. CT chest dated 05/25/2021. FINDINGS: Medial left upper lobe nodule, better visualized on prior CT. No focal consolidation.  No pleural effusion or pneumothorax. The heart is normal in size.  Thoracic aortic atherosclerosis. IMPRESSION: Medial  left upper lobe nodule, better visualized on prior CT. No evidence of acute cardiopulmonary disease. Electronically Signed   By: Julian Hy M.D.   On: 05/30/2021 15:42   DG Chest Port 1 View  Result Date: 05/28/2021 CLINICAL DATA:  Shortness of breath. EXAM: PORTABLE CHEST 1 VIEW COMPARISON:  Chest x-ray dated May 25, 2021. FINDINGS: Normal heart size. Unchanged upper mediastinal contour abnormality related to known multinodular thyroid goiter. Chronically coarsened interstitial markings with emphysematous changes. No focal consolidation, pleural effusion, or pneumothorax. No acute osseous abnormality. Old right-sided rib fractures again noted. IMPRESSION: 1. No active disease. 2. COPD. Electronically Signed   By: Titus Dubin M.D.   On: 05/28/2021 12:47   DG Chest Port 1V same Day  Result Date: 06/04/2021 CLINICAL DATA:  Central line placement. EXAM: PORTABLE CHEST 1 VIEW COMPARISON:  05/28/2021 FINDINGS: A new left jugular catheter has been placed and terminates over the upper SVC. The cardiac silhouette is normal in size. Widening of the upper mediastinum is unchanged and corresponds to known thyroid goiter. There is chronic coarsening of the interstitial markings with underlying emphysema. No confluent airspace opacity, pulmonary edema, sizable pleural effusion, or pneumothorax is identified. Aortic atherosclerosis an old right-sided rib fractures are noted. IMPRESSION: Interval left jugular catheter placement as above. Electronically Signed   By: Logan Bores M.D.   On: 06/04/2021 13:02   EEG adult  Result Date: 05/26/2021 Lora Havens, MD     05/26/2021 10:06 AM Patient Name: Imya Mance MRN: [id number] Epilepsy Attending: Lora Havens Referring Physician/Provider: Dr Lesleigh Noe Date: 05/26/2021 Duration: 23.51 mins Patient history: 75 year old female with history of breast cancer now with brain mets presented with weakness.  Patient's daughter also reported episodes of left  side shaking followed by weakness on the left side.  EEG to evaluate for seizures. Level of alertness: Awake AEDs during EEG study: Keppra Technical aspects: This EEG study was done with scalp electrodes positioned according to the 10-20 International system of electrode placement. Electrical activity was acquired at a sampling rate of 500Hz  and reviewed with a high frequency filter of 70Hz  and a low frequency filter of 1Hz . EEG data were recorded continuously and digitally stored. Description: The posterior dominant rhythm consists of 8-9 Hz activity of moderate voltage (25-35 uV) seen predominantly in posterior head regions, symmetric and reactive to eye opening and eye closing. EEG showed continuous rhythmic 2 to 3 Hz sharply contoured delta slowing in her right frontal region. Hyperventilation and photic stimulation were not performed.   ABNORMALITY -Continuous rhythmic delta slow, right frontal region IMPRESSION: This study is suggestive of cortical dysfunction in the right frontal region likely secondary to underlying mass.  Additionally, rhythmic delta slowing in frontal region is on the ictal-interictal continuum with low potential for seizures.  No seizures or definite epileptiform discharges were seen throughout the recording. Priyanka Barbra Sarks   CT CHEST ABDOMEN PELVIS WO CONTRAST  Result Date: 05/25/2021 CLINICAL DATA:  75 year old female with evaluation for metastatic disease. EXAM: CT CHEST, ABDOMEN AND PELVIS WITHOUT CONTRAST TECHNIQUE: Multidetector CT imaging of the chest, abdomen and pelvis was performed following the standard protocol without IV contrast. COMPARISON:  None. FINDINGS: Evaluation of this exam is limited in the absence of intravenous contrast. CT CHEST FINDINGS Cardiovascular: There is no cardiomegaly or pericardial effusion. Three-vessel coronary vascular calcification. Advanced atherosclerotic calcification of the thoracic aorta. No aneurysmal dilatation. The central pulmonary  arteries are grossly unremarkable. Mediastinum/Nodes: No hilar  or mediastinal adenopathy. The esophagus is grossly unremarkable. Enlarged thyroid gland with multiple nodules, likely multinodular goiter. No mediastinal fluid collection. Lungs/Pleura: Background of emphysema. There is a 1.7 x 1.0 cm left upper lobe subpleural nodule with irregular margins suspicious for a neoplastic process (32/7). Correlation with history of known malignancy and/or multidisciplinary consult and further evaluation with PET-CT is recommended. There is a 7 mm nodule with irregular margins in the left upper lobe (21/7). Apparent small nodular densities in the right upper lobe likely confluence of interstitial markings. There is however a 3 mm right upper lobe nodule (30/7). No lobar consolidation, pleural effusion, or pneumothorax. The central airways are patent. Musculoskeletal: Old healed right posterior rib fractures. No acute osseous pathology. Osteopenia with degenerative changes of the spine. CT ABDOMEN PELVIS FINDINGS No intra-abdominal free air or free fluid. Hepatobiliary: The liver is unremarkable. No intrahepatic biliary dilatation. The gallbladder is unremarkable. Pancreas: Unremarkable. No pancreatic ductal dilatation or surrounding inflammatory changes. Spleen: Normal in size without focal abnormality. Adrenals/Urinary Tract: The adrenal glands are unremarkable. There is a 2 mm nonobstructing right renal upper pole calculus. No hydronephrosis. The left kidney is unremarkable. The visualized ureters and urinary bladder are unremarkable. Stomach/Bowel: There is severe sigmoid diverticulosis without active inflammatory changes. There is no bowel obstruction or active inflammation. The appendix is normal. Vascular/Lymphatic: Advanced aortoiliac atherosclerotic disease. The IVC is unremarkable. No portal venous gas. There is no adenopathy. Reproductive: The uterus and ovaries are grossly unremarkable. No adnexal masses. Other:  None Musculoskeletal: Osteopenia with degenerative changes of the spine. No acute osseous pathology. IMPRESSION: 1. Left upper lobe 1.7 x 1.0 cm subpleural nodule suspicious for a neoplastic process. Additional smaller pulmonary nodules as above. Correlation with history of known malignancy or multidisciplinary consult and further evaluation with PET-CT is recommended. 2. No evidence of metastatic disease in the abdomen and pelvis. 3. Severe sigmoid diverticulosis. No bowel obstruction. Normal appendix. 4. Multinodular goiter. 5. Aortic Atherosclerosis (ICD10-I70.0) and Emphysema (ICD10-J43.9). Electronically Signed   By: Anner Crete M.D.   On: 05/25/2021 23:53   CT STEALTH HEAD W/O CONTRAST  Result Date: 06/03/2021 CLINICAL DATA:  Intracranial mass lesion for surgical planning. EXAM: CT HEAD WITHOUT CONTRAST TECHNIQUE: Contiguous axial images were obtained from the base of the skull through the vertex without intravenous contrast. COMPARISON:  MRI 05/25/2021 FINDINGS: Brain: Approximately 3 cm in diameter intra-axial mass of the inferior right frontal lobe is redemonstrated, with regional vasogenic edema and right-to-left shift of approximately 6 mm. No hemorrhagic component. No second lesion is seen. No hydrocephalus. Evidence of ischemic changes. Vascular: There is atherosclerotic calcification of the major vessels at the base of the brain. Skull: Negative Sinuses/Orbits: Clear/normal Other: None IMPRESSION: 3 cm intra-axial mass in the inferior right frontal lobe with regional vasogenic edema and right-to-left shift of 6 mm. Scan done for surgical planning/guidance. Electronically Signed   By: Nelson Chimes M.D.   On: 06/03/2021 13:30    Microbiology Recent Results (from the past 240 hour(s))  Urine Culture     Status: Abnormal   Collection Time: 06/23/2021  3:02 PM   Specimen: In/Out Cath Urine  Result Value Ref Range Status   Specimen Description   Final    IN/OUT CATH URINE Performed at [facility], [address], [place], [place] [address]    Special Requests   Final    NONE Performed at [facility], [address], [place], [place] [address]    Culture (A)  Final  2,000 COLONIES/mL ESCHERICHIA COLI 500 COLONIES/mL KLEBSIELLA OXYTOCA    Report Status July 20, 2021 FINAL  Final   Organism ID, Bacteria ESCHERICHIA COLI (A)  Final   Organism ID, Bacteria KLEBSIELLA OXYTOCA (A)  Final      Susceptibility   Escherichia coli - MIC*    AMPICILLIN 8 SENSITIVE Sensitive     CEFAZOLIN <=4 SENSITIVE Sensitive     CEFEPIME <=0.12 SENSITIVE Sensitive     CEFTRIAXONE <=0.25 SENSITIVE Sensitive     CIPROFLOXACIN <=0.25 SENSITIVE Sensitive     GENTAMICIN <=1 SENSITIVE Sensitive     IMIPENEM <=0.25 SENSITIVE Sensitive     NITROFURANTOIN <=16 SENSITIVE Sensitive     TRIMETH/SULFA <=20 SENSITIVE Sensitive     AMPICILLIN/SULBACTAM 4 SENSITIVE Sensitive     PIP/TAZO <=4 SENSITIVE Sensitive     * 2,000 COLONIES/mL ESCHERICHIA COLI   Klebsiella oxytoca - MIC*    AMPICILLIN >=32 RESISTANT Resistant     CEFAZOLIN 8 SENSITIVE Sensitive     CEFEPIME <=0.12 SENSITIVE Sensitive     CEFTRIAXONE <=0.25 SENSITIVE Sensitive     CIPROFLOXACIN <=0.25 SENSITIVE Sensitive     GENTAMICIN <=1 SENSITIVE Sensitive     IMIPENEM <=0.25 SENSITIVE Sensitive     NITROFURANTOIN 32 SENSITIVE Sensitive     TRIMETH/SULFA <=20 SENSITIVE Sensitive     AMPICILLIN/SULBACTAM 16 INTERMEDIATE Intermediate     PIP/TAZO <=4 SENSITIVE Sensitive     * 500 COLONIES/mL KLEBSIELLA OXYTOCA  Resp Panel by RT-PCR (Flu A&B, Covid) Nasopharyngeal Swab     Status: None   Collection Time: 06/27/2021  3:03 PM   Specimen: Nasopharyngeal Swab; Nasopharyngeal(NP) swabs in vial transport medium  Result Value Ref Range Status   SARS Coronavirus 2 by RT PCR NEGATIVE NEGATIVE Final    Comment: (NOTE) SARS-CoV-2 target nucleic acids are NOT DETECTED.  The SARS-CoV-2 RNA is generally detectable in upper respiratory specimens during the  acute phase of infection. The lowest concentration of SARS-CoV-2 viral copies this assay can detect is 138 copies/mL. A negative result does not preclude SARS-Cov-2 infection and should not be used as the sole basis for treatment or other patient management decisions. A negative result may occur with  improper specimen collection/handling, submission of specimen other than nasopharyngeal swab, presence of viral mutation(s) within the areas targeted by this assay, and inadequate number of viral copies(<138 copies/mL). A negative result must be combined with clinical observations, patient history, and epidemiological information. The expected result is Negative.  Fact Sheet for Patients:  EntrepreneurPulse.com.au  Fact Sheet for Healthcare Providers:  IncredibleEmployment.be  This test is no t yet approved or cleared by the [place] FDA and  has been authorized for detection and/or diagnosis of SARS-CoV-2 by FDA under an Emergency Use Authorization (EUA). This EUA will remain  in effect (meaning this test can be used) for the duration of the COVID-19 declaration under Section 564(b)(1) of the Act, 21 U.S.C.section 360bbb-3(b)(1), unless the authorization is terminated  or revoked sooner.       Influenza A by PCR NEGATIVE NEGATIVE Final   Influenza B by PCR NEGATIVE NEGATIVE Final    Comment: (NOTE) The Xpert Xpress SARS-CoV-2/FLU/RSV plus assay is intended as an aid in the diagnosis of influenza from Nasopharyngeal swab specimens and should not be used as a sole basis for treatment. Nasal washings and aspirates are unacceptable for Xpert Xpress SARS-CoV-2/FLU/RSV testing.  Fact Sheet for Patients: EntrepreneurPulse.com.au  Fact Sheet for Healthcare Providers: IncredibleEmployment.be  This test is not yet approved or cleared by  the [employer] and has been authorized for detection and/or  diagnosis of SARS-CoV-2 by FDA under an Emergency Use Authorization (EUA). This EUA will remain in effect (meaning this test can be used) for the duration of the COVID-19 declaration under Section 564(b)(1) of the Act, 21 U.S.C. section 360bbb-3(b)(1), unless the authorization is terminated or revoked.  Performed at [facility], [address] [place], [place] [address]   Blood Culture (routine x 2)     Status: None (Preliminary result)   Collection Time: 06/16/2021  4:05 PM   Specimen: Left Antecubital; Blood  Result Value Ref Range Status   Specimen Description   Final    LEFT ANTECUBITAL BOTTLES DRAWN AEROBIC AND ANAEROBIC   Special Requests Blood Culture adequate volume  Final   Culture   Final    NO GROWTH 3 DAYS Performed at [facility], [address] [place], [place] [address]    Report Status PENDING  Incomplete  Blood Culture (routine x 2)     Status: None (Preliminary result)   Collection Time: 06/23/2021  4:05 PM   Specimen: BLOOD RIGHT HAND  Result Value Ref Range Status   Specimen Description   Final    BLOOD RIGHT HAND BOTTLES DRAWN AEROBIC AND ANAEROBIC   Special Requests Blood Culture adequate volume  Final   Culture   Final    NO GROWTH 3 DAYS Performed at [facility], [address] [place], [place] [address]    Report Status PENDING  Incomplete    Lab Basic Metabolic Panel: Recent Labs  Lab 06/01/2021 0814 06/10/2021 1450  NA 136 137  K 4.6 5.0  CL 97* 99  CO2 27 25  GLUCOSE 136* 212*  BUN 88* 99*  CREATININE 3.40* 3.91*  CALCIUM 9.1 9.2  MG 2.4  --   PHOS 7.8*  --    Liver Function Tests: Recent Labs  Lab 05/31/2021 0814 06/27/2021 1450  AST 11* 13*  ALT 18 19  ALKPHOS 44 47  BILITOT 0.4 0.5  PROT 5.9* 6.0*  ALBUMIN 3.4* 3.4*   No results for input(s): LIPASE, AMYLASE in the last 168 hours. No results for input(s): AMMONIA in the last 168 hours. CBC: Recent Labs  Lab 06/18/2021 0814 06/13/2021 1450  WBC 10.4 13.4*  NEUTROABS 8.9* 12.2*   HGB 10.3* 10.6*  HCT 34.8* 35.8*  MCV 98.6 99.4  PLT 210 228   Sepsis Labs: Recent Labs  Lab 06/26/2021 0814 06/21/2021 1450 05/30/2021 1733  WBC 10.4 13.4*  --   LATICACIDVEN  --  1.1 1.8     Barton Dubois 07/19/21, 5:49 PM

## 2021-06-28 DEATH — deceased

## 2022-01-05 IMAGING — CT CT HEAD W/O CM
3 series · 15 of 47 positions shown, 18 images · non-contrast
Comparison: Head CT 06/03/2021, brain MRI 06/05/2021

CLINICAL DATA: Mental status change, unknown cause

EXAM:
CT HEAD WITHOUT CONTRAST
TECHNIQUE: Contiguous axial images were obtained from the base of the skull
through the vertex without intravenous contrast.

[Series 6: head w o · axial · 0.41mm/px · z∈[-89,+41]mm · 9 of 32 slices shown, 12 images]
[im 3/32  brain]
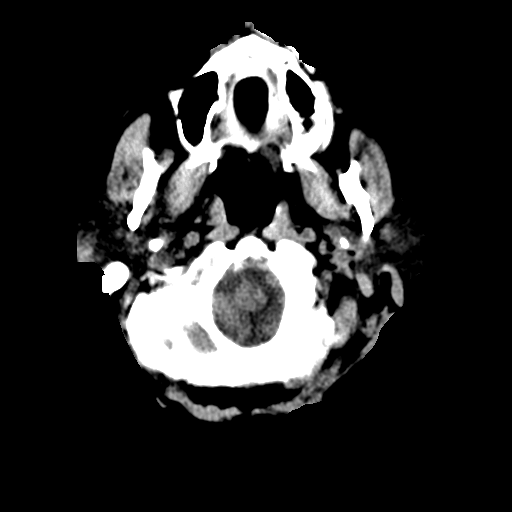
[im 3/32  bone]
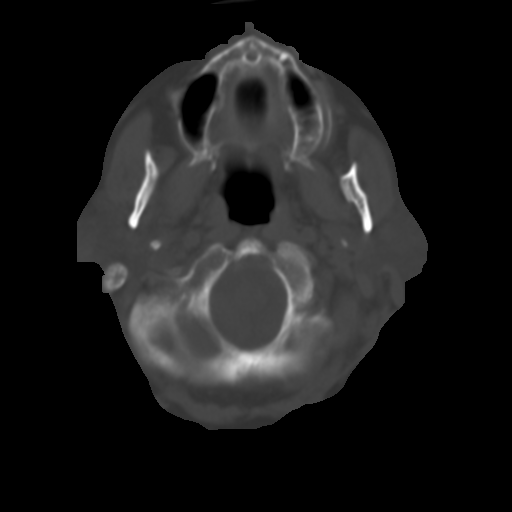
[im 6/32  brain]
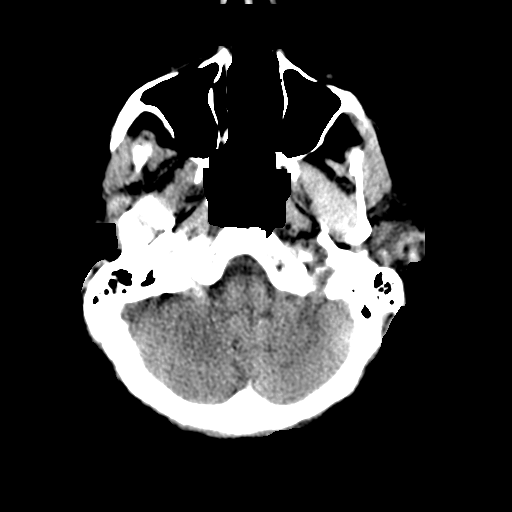
[im 9/32  brain]
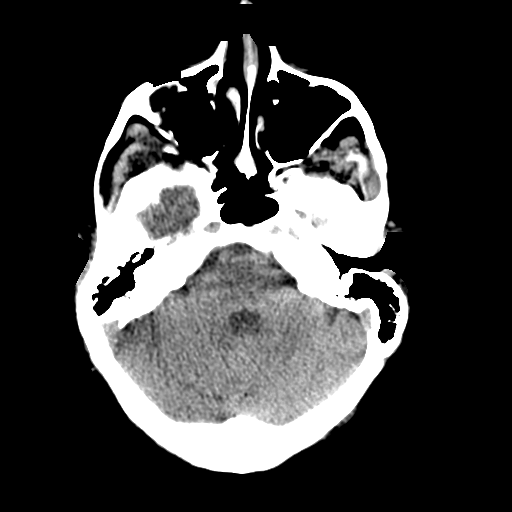
[im 12/32  brain]
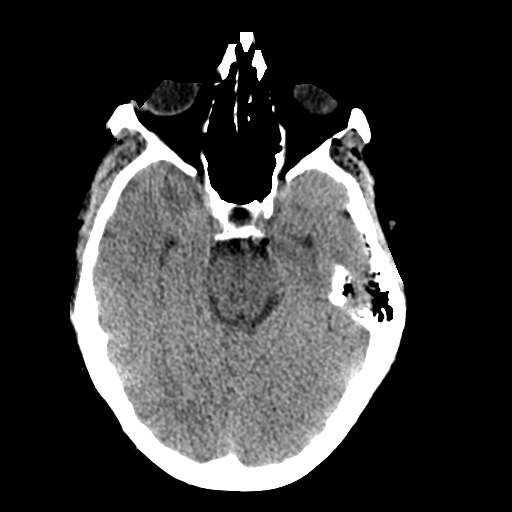
[im 17/32  brain]
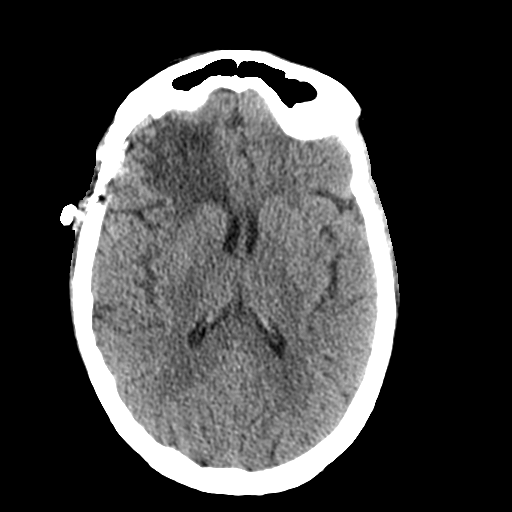
[im 17/32  bone]
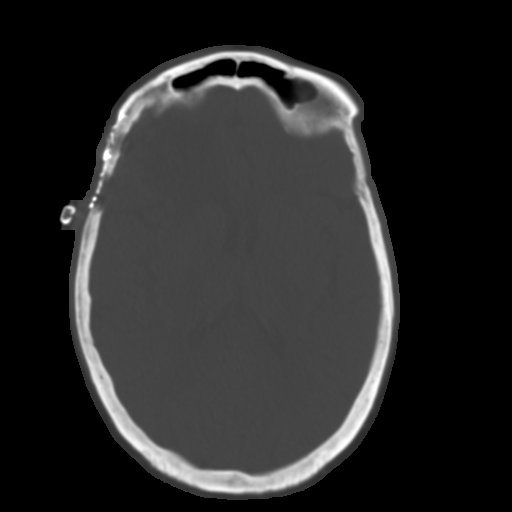
[im 20/32  brain]
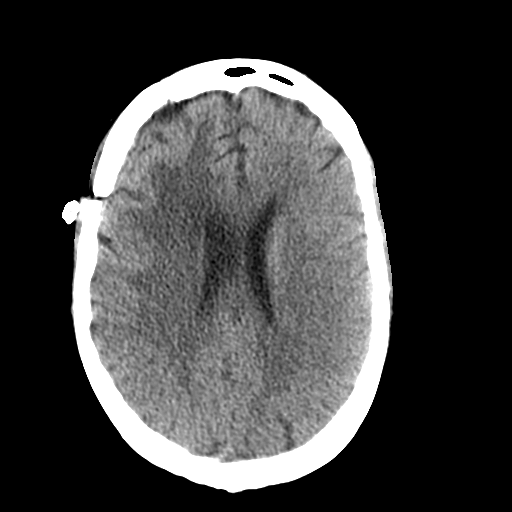
[im 23/32  brain]
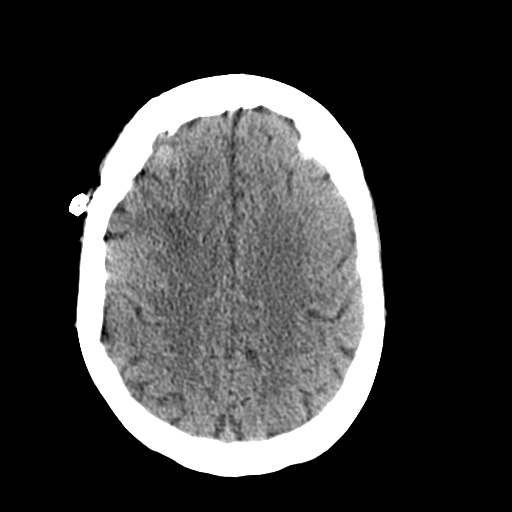
[im 26/32  brain]
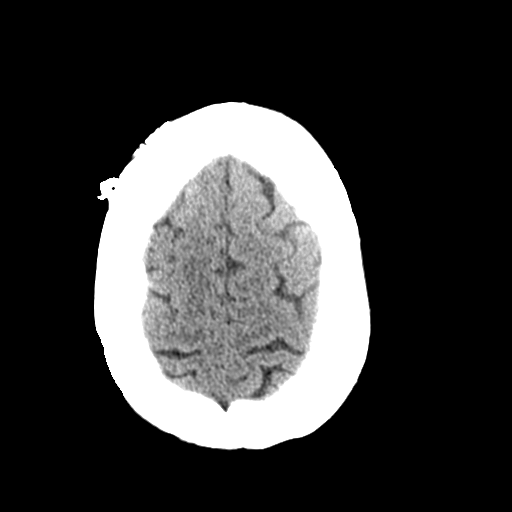
[im 29/32  brain]
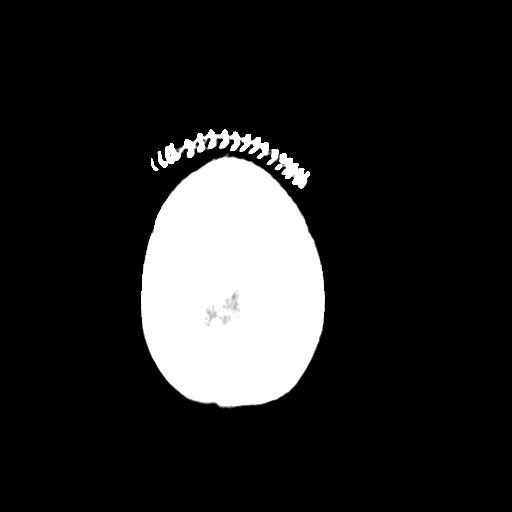
[im 29/32  bone]
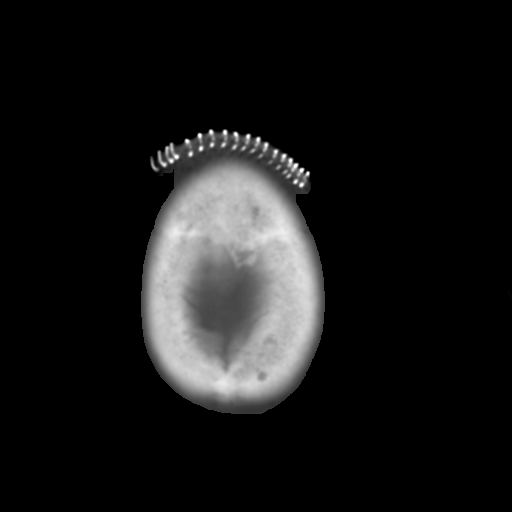

[Series 8: coronal soft · coronal · 0.32mm/px · 3 of 74 slices shown]
[im 25/74  brain]
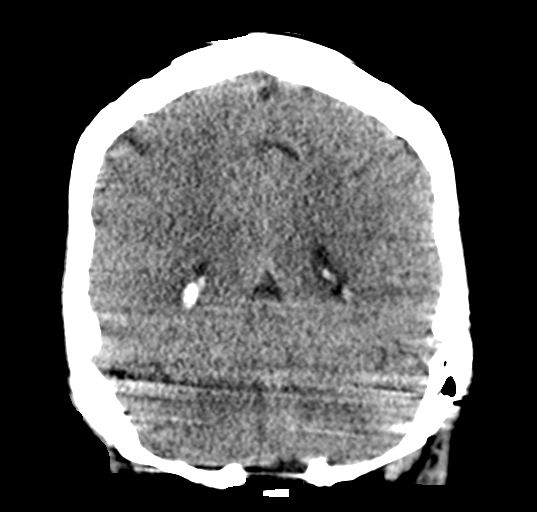
[im 33/74  brain]
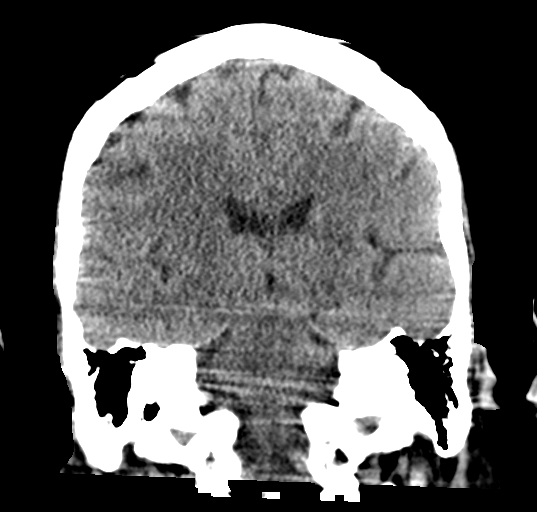
[im 41/74  brain]
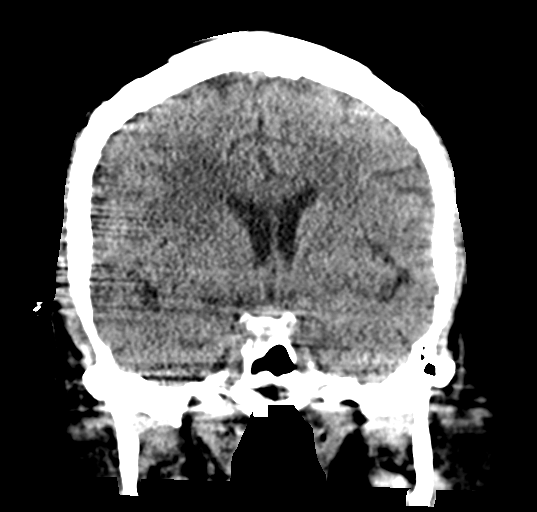

[Series 9: sagittal soft · sagittal · 0.32mm/px · 3 of 56 slices shown]
[im 19/56  brain]
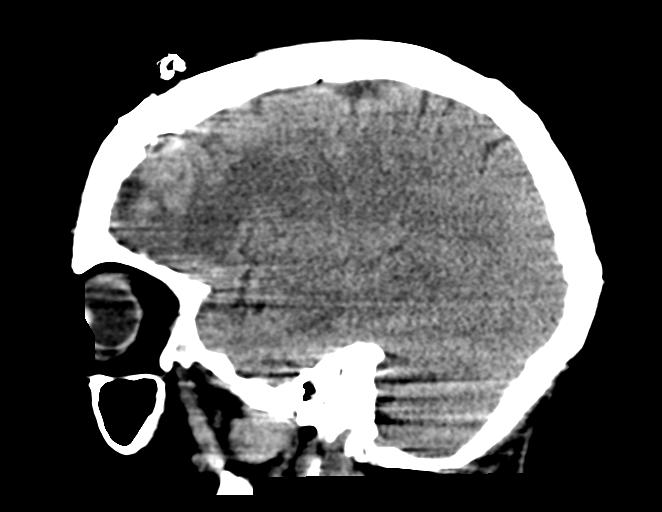
[im 28/56  brain]
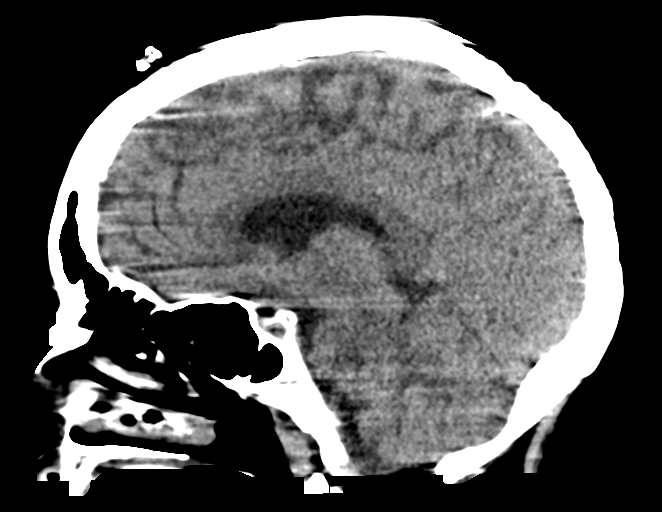
[im 37/56  brain]
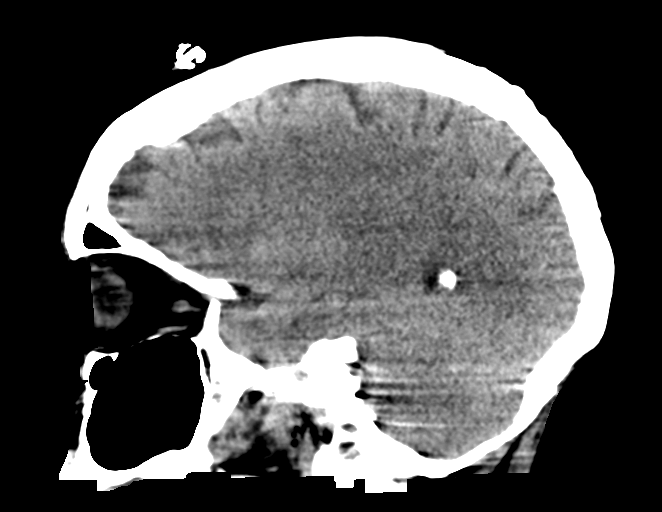

[15 of 47 positions shown; findings below may reference images not displayed]

FINDINGS: Brain: Recent right frontal craniotomy for mass resection. Right
frontal edema in the resection cavity without significant bleed.
Slight residual edema and frontal midline shift. No new hemorrhage.
No evidence of acute ischemia. No subdural or extra-axial
collection. No hydrocephalus.

Vascular: Atherosclerosis of skullbase vasculature without
hyperdense vessel or abnormal calcification.

Skull: Right frontal craniotomy.

Sinuses/Orbits: Minimal right mastoid air cells. No paranasal sinus
inflammation.

Other: None.
IMPRESSION: 1. Recent right frontal craniotomy for mass resection. Right frontal
edema in the resection cavity without significant bleed. Slight
residual edema and frontal midline shift.
2. No new abnormality.
